# Patient Record
Sex: Female | Born: 1970 | Race: Black or African American | Hispanic: No | Smoking: Former smoker
Health system: Southern US, Community
[De-identification: ages and names within clinical notes are randomized; demographics above are authoritative.]

## PROBLEM LIST (undated history)

## (undated) DIAGNOSIS — I10 Essential (primary) hypertension: Secondary | ICD-10-CM

## (undated) DIAGNOSIS — J189 Pneumonia, unspecified organism: Secondary | ICD-10-CM

## (undated) DIAGNOSIS — R0600 Dyspnea, unspecified: Secondary | ICD-10-CM

## (undated) DIAGNOSIS — Z973 Presence of spectacles and contact lenses: Secondary | ICD-10-CM

## (undated) DIAGNOSIS — D649 Anemia, unspecified: Secondary | ICD-10-CM

## (undated) DIAGNOSIS — E119 Type 2 diabetes mellitus without complications: Secondary | ICD-10-CM

## (undated) DIAGNOSIS — G4730 Sleep apnea, unspecified: Secondary | ICD-10-CM

## (undated) DIAGNOSIS — Z8759 Personal history of other complications of pregnancy, childbirth and the puerperium: Secondary | ICD-10-CM

## (undated) DIAGNOSIS — R319 Hematuria, unspecified: Secondary | ICD-10-CM

## (undated) DIAGNOSIS — Z6841 Body Mass Index (BMI) 40.0 and over, adult: Secondary | ICD-10-CM

## (undated) DIAGNOSIS — N939 Abnormal uterine and vaginal bleeding, unspecified: Secondary | ICD-10-CM

## (undated) DIAGNOSIS — J45909 Unspecified asthma, uncomplicated: Secondary | ICD-10-CM

## (undated) DIAGNOSIS — F419 Anxiety disorder, unspecified: Secondary | ICD-10-CM

## (undated) DIAGNOSIS — Z9289 Personal history of other medical treatment: Secondary | ICD-10-CM

## (undated) HISTORY — PX: TUBAL LIGATION: SHX77

## (undated) HISTORY — DX: Pneumonia, unspecified organism: J18.9

## (undated) HISTORY — DX: Body Mass Index (BMI) 40.0 and over, adult: Z684

## (undated) HISTORY — DX: Anemia, unspecified: D64.9

## (undated) HISTORY — DX: Other disorders of bilirubin metabolism: E80.6

## (undated) HISTORY — DX: Hematuria, unspecified: R31.9

## (undated) HISTORY — DX: Unspecified asthma, uncomplicated: J45.909

## (undated) HISTORY — DX: Morbid (severe) obesity due to excess calories: E66.01

## (undated) HISTORY — DX: Essential (primary) hypertension: I10

---

## 2011-11-20 DIAGNOSIS — R0600 Dyspnea, unspecified: Secondary | ICD-10-CM | POA: Insufficient documentation

## 2016-04-15 DIAGNOSIS — Z124 Encounter for screening for malignant neoplasm of cervix: Secondary | ICD-10-CM | POA: Diagnosis not present

## 2016-04-15 DIAGNOSIS — Z1231 Encounter for screening mammogram for malignant neoplasm of breast: Secondary | ICD-10-CM | POA: Diagnosis not present

## 2016-04-15 DIAGNOSIS — N939 Abnormal uterine and vaginal bleeding, unspecified: Secondary | ICD-10-CM | POA: Diagnosis not present

## 2016-04-15 DIAGNOSIS — N921 Excessive and frequent menstruation with irregular cycle: Secondary | ICD-10-CM | POA: Diagnosis not present

## 2016-04-22 DIAGNOSIS — D649 Anemia, unspecified: Secondary | ICD-10-CM | POA: Diagnosis not present

## 2016-04-22 DIAGNOSIS — Z87891 Personal history of nicotine dependence: Secondary | ICD-10-CM | POA: Diagnosis not present

## 2016-04-22 DIAGNOSIS — D500 Iron deficiency anemia secondary to blood loss (chronic): Secondary | ICD-10-CM | POA: Insufficient documentation

## 2016-04-22 DIAGNOSIS — Z809 Family history of malignant neoplasm, unspecified: Secondary | ICD-10-CM | POA: Diagnosis not present

## 2016-04-22 DIAGNOSIS — I10 Essential (primary) hypertension: Secondary | ICD-10-CM | POA: Diagnosis not present

## 2016-04-22 DIAGNOSIS — D473 Essential (hemorrhagic) thrombocythemia: Secondary | ICD-10-CM | POA: Diagnosis not present

## 2016-04-22 DIAGNOSIS — Z832 Family history of diseases of the blood and blood-forming organs and certain disorders involving the immune mechanism: Secondary | ICD-10-CM | POA: Diagnosis not present

## 2016-04-22 DIAGNOSIS — N921 Excessive and frequent menstruation with irregular cycle: Secondary | ICD-10-CM | POA: Diagnosis not present

## 2016-07-01 DIAGNOSIS — R9431 Abnormal electrocardiogram [ECG] [EKG]: Secondary | ICD-10-CM | POA: Diagnosis not present

## 2016-07-01 DIAGNOSIS — I10 Essential (primary) hypertension: Secondary | ICD-10-CM | POA: Diagnosis not present

## 2016-07-10 DIAGNOSIS — I10 Essential (primary) hypertension: Secondary | ICD-10-CM | POA: Diagnosis not present

## 2016-10-13 DIAGNOSIS — R319 Hematuria, unspecified: Secondary | ICD-10-CM | POA: Insufficient documentation

## 2016-10-13 DIAGNOSIS — R05 Cough: Secondary | ICD-10-CM | POA: Diagnosis not present

## 2016-10-13 DIAGNOSIS — R509 Fever, unspecified: Secondary | ICD-10-CM | POA: Diagnosis not present

## 2016-10-16 DIAGNOSIS — J4522 Mild intermittent asthma with status asthmaticus: Secondary | ICD-10-CM | POA: Diagnosis not present

## 2017-04-21 ENCOUNTER — Encounter: Payer: Self-pay | Admitting: Nurse Practitioner

## 2017-04-21 ENCOUNTER — Ambulatory Visit: Payer: Medicaid Other | Attending: Nurse Practitioner | Admitting: Nurse Practitioner

## 2017-04-21 VITALS — BP 133/94 | HR 88 | Temp 97.8°F | Ht 73.0 in | Wt 298.6 lb

## 2017-04-21 DIAGNOSIS — I10 Essential (primary) hypertension: Secondary | ICD-10-CM | POA: Diagnosis not present

## 2017-04-21 DIAGNOSIS — Z6841 Body Mass Index (BMI) 40.0 and over, adult: Secondary | ICD-10-CM | POA: Diagnosis not present

## 2017-04-21 DIAGNOSIS — F419 Anxiety disorder, unspecified: Secondary | ICD-10-CM

## 2017-04-21 DIAGNOSIS — J4520 Mild intermittent asthma, uncomplicated: Secondary | ICD-10-CM

## 2017-04-21 DIAGNOSIS — G4709 Other insomnia: Secondary | ICD-10-CM

## 2017-04-21 DIAGNOSIS — Z79899 Other long term (current) drug therapy: Secondary | ICD-10-CM | POA: Insufficient documentation

## 2017-04-21 DIAGNOSIS — G4700 Insomnia, unspecified: Secondary | ICD-10-CM | POA: Diagnosis not present

## 2017-04-21 DIAGNOSIS — N938 Other specified abnormal uterine and vaginal bleeding: Secondary | ICD-10-CM | POA: Diagnosis not present

## 2017-04-21 MED ORDER — MEDROXYPROGESTERONE ACETATE 10 MG PO TABS: 10.0000 mg | ORAL_TABLET | Freq: Every day | ORAL | 0 refills | Status: DC

## 2017-04-21 MED ORDER — LISINOPRIL-HYDROCHLOROTHIAZIDE 20-25 MG PO TABS: 2.0000 | ORAL_TABLET | Freq: Every day | ORAL | 1 refills | Status: DC

## 2017-04-21 MED ORDER — LISINOPRIL-HYDROCHLOROTHIAZIDE 20-25 MG PO TABS: 1.0000 | ORAL_TABLET | Freq: Every day | ORAL | 1 refills | Status: DC

## 2017-04-21 MED ORDER — TRAZODONE HCL 100 MG PO TABS: 100.0000 mg | ORAL_TABLET | Freq: Every day | ORAL | 1 refills | Status: DC

## 2017-04-21 MED ORDER — FLUTICASONE FUROATE-VILANTEROL 100-25 MCG/INH IN AEPB: 1.0000 | INHALATION_SPRAY | Freq: Every day | RESPIRATORY_TRACT | 3 refills | Status: DC

## 2017-04-21 MED ORDER — BUSPIRONE HCL 15 MG PO TABS: 15.0000 mg | ORAL_TABLET | Freq: Three times a day (TID) | ORAL | 2 refills | Status: DC

## 2017-04-21 NOTE — Progress Notes (Signed)
Assessment & Plan:  Tonya Wilkerson was seen today for new patient (initial visit).  Diagnoses and all orders for this visit:  Essential hypertension -     CBC -     Basic metabolic panel -     Lipid panel -     lisinopril-hydrochlorothiazide (PRINZIDE,ZESTORETIC) 20-25 MG tablet; Take 2 tablets by mouth daily. Continue all antihypertensives as prescribed.  Remember to bring in your blood pressure log with you for your follow up appointment.  DASH/Mediterranean Diets are healthier choices for HTN.    Other insomnia -     Ambulatory referral to Sleep Studies -     traZODone (DESYREL) 100 MG tablet; Take 1 tablet (100 mg total) by mouth at bedtime.  -     Ambulatory referral to Psychology.  Dysfunctional uterine bleeding -     medroxyPROGESTERone (PROVERA) 10 MG tablet; Take 1 tablet (10 mg total) by mouth daily.  Mild intermittent asthma without complication -     fluticasone furoate-vilanterol (BREO ELLIPTA) 100-25 MCG/INH AEPB; Inhale 1 puff into the lungs daily. Avoid asthma/allergy triggers.    Anxiety -     busPIRone (BUSPAR) 15 MG tablet; Take 1 tablet (15 mg total) by mouth 3 (three) times daily.  Take medications as prescribed.     Patient has been counseled on age-appropriate routine health concerns for screening and prevention. These are reviewed and up-to-date. Referrals have been placed accordingly. Immunizations are up-to-date or declined.    Subjective:   Chief Complaint  Patient presents with  . New Patient (Initial Visit)    Patient is here to establish care.    [name] 47 y.o. female presents to office today to establish care. She has a history of essential hypertension as well as asthma and anxiety.   Anxiety Takes Buspar with significant relief of anxiety symptoms. She lost her mother about 3 months ago unexpectedly as well as her uncle a few months ago. Having a difficult time financially, personally and emotionally. She would like a referral to  a grief counselor. She declines starting SSRI today. She denies any suicidal ideation.  Depression screen PHQ 2/9 04/21/2017  Decreased Interest 1  Down, Depressed, Hopeless 1  PHQ - 2 Score 2  Altered sleeping 3  Tired, decreased energy 2  Change in appetite 3  Feeling bad or failure about yourself  0  Trouble concentrating 0  Moving slowly or fidgety/restless 0  Suicidal thoughts 0  PHQ-9 Score 10    Insomnia This has been ongoing for several months. She has tried melatonin with no relief of symptoms. Having difficulty falling and staying asleep. Will try trazodone. Will also order sleep apnea study due to obesity, difficulty staying asleep and poorly controlled hypertension.  Dysfunctional uterine bleeding She has been experiencing AUB for 4 months. She endorses a normal PAP as of  03-2016. Denies menorrhagia but states she is bleeding every day. She does have a history of IDA. Will try provera and refer back to GYN if unsuccessful. She denies any other abnormal GU symptoms. Denies abdominal pain.   Essential Hypertension Blood pressure borderline controlled today. She reports readings at home upper 140/90s. Will increase prinzide to 2 tablets. Denies chest pain, palpitations, lightheadedness, dizziness, or BLE edema. She does endorse shortness of breath and a mild headache today.   Asthma Well controlled with BREO. She has had asthma exacerbations in the past requiring hospitalizations as well as CAP. She has been out of her inhaler   Review  of Systems  Constitutional: Negative for fever, malaise/fatigue and weight loss.  HENT: Negative.  Negative for nosebleeds.   Eyes: Negative.  Negative for blurred vision, double vision and photophobia.  Respiratory: Positive for cough and shortness of breath.   Cardiovascular: Negative.  Negative for chest pain, palpitations and leg swelling.  Gastrointestinal: Negative.  Negative for abdominal pain, constipation, diarrhea, heartburn, nausea  and vomiting.  Genitourinary: Negative for dysuria, frequency and urgency.  Musculoskeletal: Negative.  Negative for myalgias.  Neurological: Positive for headaches. Negative for dizziness, focal weakness and seizures.  Endo/Heme/Allergies: Negative for environmental allergies.  Psychiatric/Behavioral: Positive for depression. Negative for suicidal ideas. The patient is nervous/anxious.     Past Medical History:  Diagnosis Date  . Anemia   . Asthma   . Hematuria   . Hyperbilirubinemia   . Hypertension   . Morbid obesity with BMI of 40.0-44.9, adult ([place])   . Pneumonia due to infectious organism     Past Surgical History:  Procedure Laterality Date  . TUBAL LIGATION      Family History  Problem Relation Age of Onset  . Hypertension Mother   . Stroke Mother   . Diabetes Maternal Aunt   . Cancer Maternal Grandfather     Social History Reviewed with no changes to be made today.   Outpatient Medications Prior to Visit  Medication Sig Dispense Refill  . amLODipine (NORVASC) 10 MG tablet Take 10 mg by mouth daily.    . busPIRone (BUSPAR) 15 MG tablet Take 15 mg by mouth 3 (three) times daily.    Marland Kitchen lisinopril-hydrochlorothiazide (PRINZIDE,ZESTORETIC) 20-25 MG tablet Take 1 tablet by mouth daily.     No facility-administered medications prior to visit.     Not on File     Objective:    BP (!) 133/94 (BP Location: Left Arm, Cuff Size: Large)   Pulse 88   Temp 97.8 F (36.6 C) (Oral)   Ht 6\' 1"  (1.854 m)   Wt 298 lb 9.6 oz (135.4 kg)   SpO2 98%   BMI 39.40 kg/m  Wt Readings from Last 3 Encounters:  04/21/17 298 lb 9.6 oz (135.4 kg)    Physical Exam  Constitutional: She is oriented to person, place, and time. She appears well-developed and well-nourished. She is cooperative.  HENT:  Head: Normocephalic and atraumatic.  Eyes: EOM are normal.  Neck: Normal range of motion.  Cardiovascular: Normal rate, regular rhythm and normal heart sounds. Exam reveals no gallop  and no friction rub.  No murmur heard. Pulmonary/Chest: Effort normal and breath sounds normal. No tachypnea. No respiratory distress. She has no decreased breath sounds. She has no wheezes. She has no rhonchi. She has no rales. She exhibits no tenderness.  Abdominal: Soft. Bowel sounds are normal.  Musculoskeletal: Normal range of motion. She exhibits no edema.  Neurological: She is alert and oriented to person, place, and time. Coordination normal.  Skin: Skin is warm and dry.  Psychiatric: She has a normal mood and affect. Her behavior is normal. Judgment and thought content normal.  Nursing note and vitals reviewed.      Patient has been counseled extensively about nutrition and exercise as well as the importance of adherence with medications and regular follow-up. The patient was given clear instructions to go to ER or return to medical center if symptoms don't improve, worsen or new problems develop. The patient verbalized understanding.   Follow-up: Return in about 2 weeks (around 05/05/2017) for BP recheck needs 30 minutes.  Gildardo Pounds, FNP-BC [facility] and [place], [place]   04/21/2017, 1:24 PM

## 2017-04-21 NOTE — Patient Instructions (Addendum)

## 2017-04-22 LAB — BASIC METABOLIC PANEL
BUN/Creatinine Ratio: 7 — ABNORMAL LOW (ref 9–23)
BUN: 6 mg/dL (ref 6–24)
CALCIUM: 10.1 mg/dL (ref 8.7–10.2)
CHLORIDE: 98 mmol/L (ref 96–106)
CO2: 23 mmol/L (ref 20–29)
Creatinine, Ser: 0.83 mg/dL (ref 0.57–1.00)
GFR calc Af Amer: 97 mL/min/{1.73_m2} (ref >59)
GFR, EST NON AFRICAN AMERICAN: 84 mL/min/{1.73_m2} (ref >59)
GLUCOSE: 103 mg/dL — AB (ref 65–99)
Potassium: 4.6 mmol/L (ref 3.5–5.2)
SODIUM: 139 mmol/L (ref 134–144)

## 2017-04-22 LAB — CBC
HEMATOCRIT: 46.5 % (ref 34.0–46.6)
Hemoglobin: 14.9 g/dL (ref 11.1–15.9)
MCH: 29 pg (ref 26.6–33.0)
MCHC: 32 g/dL (ref 31.5–35.7)
MCV: 91 fL (ref 79–97)
Platelets: 442 10*3/uL — ABNORMAL HIGH (ref 150–379)
RBC: 5.13 x10E6/uL (ref 3.77–5.28)
RDW: 14.9 % (ref 12.3–15.4)
WBC: 8 10*3/uL (ref 3.4–10.8)

## 2017-04-22 LAB — LIPID PANEL
CHOL/HDL RATIO: 2.1 ratio (ref 0.0–4.4)
Cholesterol, Total: 223 mg/dL — ABNORMAL HIGH (ref 100–199)
HDL: 106 mg/dL (ref >39)
LDL Calculated: 97 mg/dL (ref 0–99)
TRIGLYCERIDES: 101 mg/dL (ref 0–149)
VLDL Cholesterol Cal: 20 mg/dL (ref 5–40)

## 2017-05-05 DIAGNOSIS — I10 Essential (primary) hypertension: Secondary | ICD-10-CM | POA: Diagnosis not present

## 2017-05-05 DIAGNOSIS — R29818 Other symptoms and signs involving the nervous system: Secondary | ICD-10-CM | POA: Diagnosis not present

## 2017-05-05 DIAGNOSIS — R0683 Snoring: Secondary | ICD-10-CM | POA: Diagnosis not present

## 2017-05-05 DIAGNOSIS — Z6841 Body Mass Index (BMI) 40.0 and over, adult: Secondary | ICD-10-CM | POA: Diagnosis not present

## 2017-05-05 DIAGNOSIS — G4710 Hypersomnia, unspecified: Secondary | ICD-10-CM | POA: Diagnosis not present

## 2017-05-19 ENCOUNTER — Encounter: Payer: Self-pay | Admitting: Nurse Practitioner

## 2017-05-19 ENCOUNTER — Ambulatory Visit: Payer: Medicaid Other | Attending: Nurse Practitioner | Admitting: Nurse Practitioner

## 2017-05-19 VITALS — BP 142/96 | HR 92 | Temp 97.9°F | Ht 73.0 in | Wt 299.8 lb

## 2017-05-19 DIAGNOSIS — G4733 Obstructive sleep apnea (adult) (pediatric): Secondary | ICD-10-CM | POA: Diagnosis not present

## 2017-05-19 DIAGNOSIS — Z8249 Family history of ischemic heart disease and other diseases of the circulatory system: Secondary | ICD-10-CM | POA: Diagnosis not present

## 2017-05-19 DIAGNOSIS — I10 Essential (primary) hypertension: Secondary | ICD-10-CM

## 2017-05-19 DIAGNOSIS — Z833 Family history of diabetes mellitus: Secondary | ICD-10-CM | POA: Diagnosis not present

## 2017-05-19 DIAGNOSIS — Z6841 Body Mass Index (BMI) 40.0 and over, adult: Secondary | ICD-10-CM | POA: Insufficient documentation

## 2017-05-19 DIAGNOSIS — Z79899 Other long term (current) drug therapy: Secondary | ICD-10-CM | POA: Insufficient documentation

## 2017-05-19 DIAGNOSIS — F419 Anxiety disorder, unspecified: Secondary | ICD-10-CM | POA: Insufficient documentation

## 2017-05-19 DIAGNOSIS — Z7189 Other specified counseling: Secondary | ICD-10-CM | POA: Diagnosis not present

## 2017-05-19 DIAGNOSIS — Z23 Encounter for immunization: Secondary | ICD-10-CM | POA: Diagnosis not present

## 2017-05-19 DIAGNOSIS — Z823 Family history of stroke: Secondary | ICD-10-CM | POA: Diagnosis not present

## 2017-05-19 DIAGNOSIS — Z9851 Tubal ligation status: Secondary | ICD-10-CM | POA: Insufficient documentation

## 2017-05-19 NOTE — Progress Notes (Signed)
Assessment & Plan:  Tonya Wilkerson was seen today for follow-up.  Diagnoses and all orders for this visit:  Essential hypertension She no refills required today however Continue all antihypertensives as prescribed.  Remember to bring in your blood pressure log with you for your follow up appointment.  DASH/Mediterranean Diets are healthier choices for HTN.    Immunization due -     Flu Vaccine QUAD 6+ mos PF IM (Fluarix Quad PF)  Grief counseling -     Ambulatory referral to Psychology    Patient has been counseled on age-appropriate routine health concerns for screening and prevention. These are reviewed and up-to-date. Referrals have been placed accordingly. Immunizations are up-to-date or declined.    Subjective:   Chief Complaint  Patient presents with  . Follow-up    Patient is here for a follow-up on blood pressure.    [name] 47 y.o. female presents to office today for blood pressure follow up.  Essential Hypertension Chronic.  Patient endorses medication compliance.  Currently taking Prinzide 2 tablets daily as well as amlodipine 10 mg.  She recently had a sleep study performed and was diagnosed with obstructive sleep apnea.  I will not make any antihypertensive medication changes until she has received her CPAP and started using.  I believe some of her hypertension may be related to just poorly controlled OSA. Denies chest pain, shortness of breath, palpitations, lightheadedness, dizziness, headaches or BLE edema. She has a home blood pressure monitor and was instructed today to begin checking her blood pressure 3 times a week. BP Readings from Last 3 Encounters:  05/19/17 (!) 142/96  04/21/17 (!) 133/94   Grief Counseling  Her mother passed away a few months ago.  She endorses sadness and is experiencing grief at this time.  I referred her to an LCSW at her last office visit however she states she was not happy with the telephone interaction she Had with office  personnel when she was contacted to schedule an appointment.  I will place a new referral today for a psychologist.  She is currently taking Buspar which is providing significant relief of her anxiety.  Depression screen PHQ 2/9 05/19/2017  Decreased Interest 1  Down, Depressed, Hopeless 1  PHQ - 2 Score 2  Altered sleeping 1  Tired, decreased energy 2  Change in appetite 3  Feeling bad or failure about yourself  0  Trouble concentrating 1  Moving slowly or fidgety/restless 0  Suicidal thoughts 0  PHQ-9 Score 9    Review of Systems  Constitutional: Negative for fever, malaise/fatigue and weight loss.  HENT: Negative.  Negative for nosebleeds.   Eyes: Negative.  Negative for blurred vision, double vision and photophobia.  Respiratory: Negative.  Negative for cough and shortness of breath.   Cardiovascular: Negative.  Negative for chest pain, palpitations and leg swelling.  Gastrointestinal: Negative.  Negative for heartburn, nausea and vomiting.  Musculoskeletal: Negative.  Negative for myalgias.  Neurological: Negative.  Negative for dizziness, focal weakness, seizures and headaches.  Psychiatric/Behavioral: Positive for depression. Negative for suicidal ideas. The patient is nervous/anxious and has insomnia.     Past Medical History:  Diagnosis Date  . Anemia   . Asthma   . Hematuria   . Hyperbilirubinemia   . Hypertension   . Morbid obesity with BMI of 40.0-44.9, adult ([place])   . Pneumonia due to infectious organism     Past Surgical History:  Procedure Laterality Date  . TUBAL LIGATION  Family History  Problem Relation Age of Onset  . Hypertension Mother   . Stroke Mother   . Diabetes Maternal Aunt   . Cancer Maternal Grandfather     Social History Reviewed with no changes to be made today.   Outpatient Medications Prior to Visit  Medication Sig Dispense Refill  . amLODipine (NORVASC) 10 MG tablet Take 10 mg by mouth daily.    . busPIRone (BUSPAR) 15 MG  tablet Take 1 tablet (15 mg total) by mouth 3 (three) times daily. 90 tablet 2  . fluticasone furoate-vilanterol (BREO ELLIPTA) 100-25 MCG/INH AEPB Inhale 1 puff into the lungs daily. 60 each 3  . lisinopril-hydrochlorothiazide (PRINZIDE,ZESTORETIC) 20-25 MG tablet Take 2 tablets by mouth daily. 90 tablet 1  . medroxyPROGESTERone (PROVERA) 10 MG tablet Take 1 tablet (10 mg total) by mouth daily. 10 tablet 0  . traZODone (DESYREL) 100 MG tablet Take 1 tablet (100 mg total) by mouth at bedtime. (Patient not taking: Reported on 05/19/2017) 90 tablet 1   No facility-administered medications prior to visit.     Not on File     Objective:    BP (!) 142/96 (BP Location: Left Arm, Patient Position: Sitting, Cuff Size: Large)   Pulse 92   Temp 97.9 F (36.6 C) (Oral)   Ht 6\' 1"  (1.854 m)   Wt 299 lb 12.8 oz (136 kg)   LMP 05/11/2017   SpO2 95%   BMI 39.55 kg/m   Wt Readings from Last 3 Encounters:  05/19/17 299 lb 12.8 oz (136 kg)  04/21/17 298 lb 9.6 oz (135.4 kg)    Physical Exam  Constitutional: She is oriented to person, place, and time. She appears well-developed and well-nourished. She is cooperative.  HENT:  Head: Normocephalic and atraumatic.  Eyes: EOM are normal.  Neck: Normal range of motion and full passive range of motion without pain. Neck supple. No thyromegaly present.  Cardiovascular: Normal rate, regular rhythm and normal heart sounds. Exam reveals no gallop and no friction rub.  No murmur heard. Pulmonary/Chest: Effort normal and breath sounds normal. No tachypnea. No respiratory distress. She has no decreased breath sounds. She has no wheezes. She has no rhonchi. She has no rales. She exhibits no tenderness.  Abdominal: Soft. Bowel sounds are normal.  Musculoskeletal: Normal range of motion. She exhibits no edema.  Lymphadenopathy:    She has no cervical adenopathy.  Neurological: She is alert and oriented to person, place, and time. Coordination normal.  Skin:  Skin is warm and dry.  Psychiatric: She has a normal mood and affect. Her speech is normal and behavior is normal. Judgment and thought content normal. Cognition and memory are normal. She expresses no homicidal and no suicidal ideation. She expresses no suicidal plans and no homicidal plans.  Nursing note and vitals reviewed.      Patient has been counseled extensively about nutrition and exercise as well as the importance of adherence with medications and regular follow-up. The patient was given clear instructions to go to ER or return to medical center if symptoms don't improve, worsen or new problems develop. The patient verbalized understanding.   Follow-up: Return in about 1 month (around 06/19/2017) for BP recheck.   Gildardo Pounds, FNP-BC [facility] and [facility], [place]   05/19/2017, 1:21 PM

## 2017-05-19 NOTE — Patient Instructions (Addendum)
Sleep Apnea Sleep apnea is a condition in which breathing pauses or becomes shallow during sleep. Episodes of sleep apnea usually last 10 seconds or longer, and they may occur as many as 20 times an hour. Sleep apnea disrupts your sleep and keeps your body from getting the rest that it needs. This condition can increase your risk of certain health problems, including:  Heart attack.  Stroke.  Obesity.  Diabetes.  Heart failure.  Irregular heartbeat.  There are three kinds of sleep apnea:  Obstructive sleep apnea. This kind is caused by a blocked or collapsed airway.  Central sleep apnea. This kind happens when the part of the brain that controls breathing does not send the correct signals to the muscles that control breathing.  Mixed sleep apnea. This is a combination of obstructive and central sleep apnea.  What are the causes? The most common cause of this condition is a collapsed or blocked airway. An airway can collapse or become blocked if:  Your throat muscles are abnormally relaxed.  Your tongue and tonsils are larger than normal.  You are overweight.  Your airway is smaller than normal.  What increases the risk? This condition is more likely to develop in people who:  Are overweight.  Smoke.  Have a smaller than normal airway.  Are elderly.  Are female.  Drink alcohol.  Take sedatives or tranquilizers.  Have a family history of sleep apnea.  What are the signs or symptoms? Symptoms of this condition include:  Trouble staying asleep.  Daytime sleepiness and tiredness.  Irritability.  Loud snoring.  Morning headaches.  Trouble concentrating.  Forgetfulness.  Decreased interest in sex.  Unexplained sleepiness.  Mood swings.  Personality changes.  Feelings of depression.  Waking up often during the night to urinate.  Dry mouth.  Sore throat.  How is this diagnosed? This condition may be diagnosed with:  A medical history.  A  physical exam.  A series of tests that are done while you are sleeping (sleep study). These tests are usually done in a sleep lab, but they may also be done at home.  How is this treated? Treatment for this condition aims to restore normal breathing and to ease symptoms during sleep. It may involve managing health issues that can affect breathing, such as high blood pressure or obesity. Treatment may include:  Sleeping on your side.  Using a decongestant if you have nasal congestion.  Avoiding the use of depressants, including alcohol, sedatives, and narcotics.  Losing weight if you are overweight.  Making changes to your diet.  Quitting smoking.  Using a device to open your airway while you sleep, such as: ? An oral appliance. This is a custom-made mouthpiece that shifts your lower jaw forward. ? A continuous positive airway pressure (CPAP) device. This device delivers oxygen to your airway through a mask. ? A nasal expiratory positive airway pressure (EPAP) device. This device has valves that you put into each nostril. ? A bi-level positive airway pressure (BPAP) device. This device delivers oxygen to your airway through a mask.  Surgery if other treatments do not work. During surgery, excess tissue is removed to create a wider airway.  It is important to get treatment for sleep apnea. Without treatment, this condition can lead to:  High blood pressure.  Coronary artery disease.  (Men) An inability to achieve or maintain an erection (impotence).  Reduced thinking abilities.  Follow these instructions at home:  Make any lifestyle changes that your health   care provider recommends.  Eat a healthy, well-balanced diet.  Take over-the-counter and prescription medicines only as told by your health care provider.  Avoid using depressants, including alcohol, sedatives, and narcotics.  Take steps to lose weight if you are overweight.  If you were given a device to open your  airway while you sleep, use it only as told by your health care provider.  Do not use any tobacco products, such as cigarettes, chewing tobacco, and e-cigarettes. If you need help quitting, ask your health care provider.  Keep all follow-up visits as told by your health care provider. This is important. Contact a health care provider if:  The device that you received to open your airway during sleep is uncomfortable or does not seem to be working.  Your symptoms do not improve.  Your symptoms get worse. Get help right away if:  You develop chest pain.  You develop shortness of breath.  You develop discomfort in your back, arms, or stomach.  You have trouble speaking.  You have weakness on one side of your body.  You have drooping in your face. These symptoms may represent a serious problem that is an emergency. Do not wait to see if the symptoms will go away. Get medical help right away. Call your local emergency services (911 in the [place] Do not drive yourself to the hospital. This information is not intended to replace advice given to you by your health care provider. Make sure you discuss any questions you have with your health care provider. Document Released: 02/21/2002 Document Revised: 10/28/2015 Document Reviewed: 12/11/2014 Elsevier Interactive Patient Education  2018 [employer].  CPAP and BiPAP Information CPAP and BiPAP are methods of helping a person breathe with the use of air pressure. CPAP stands for "continuous positive airway pressure." BiPAP stands for "bi-level positive airway pressure." In both methods, air is blown through your nose or mouth and into your air passages to help you breathe well. CPAP and BiPAP use different amounts of pressure to blow air. With CPAP, the amount of pressure stays the same while you breathe in and out. With BiPAP, the amount of pressure is increased when you breathe in (inhale) so that you can take larger breaths. Your health care  provider will recommend whether CPAP or BiPAP would be more helpful for you. Why are CPAP and BiPAP treatments used? CPAP or BiPAP can be helpful if you have:  Sleep apnea.  Chronic obstructive pulmonary disease (COPD).  Heart failure.  Medical conditions that weaken the muscles of the chest including muscular dystrophy, or neurological diseases such as amyotrophic lateral sclerosis (ALS).  Other problems that cause breathing to be weak, abnormal, or difficult.  CPAP is most commonly used for obstructive sleep apnea (OSA) to keep the airways from collapsing when the muscles relax during sleep. How is CPAP or BiPAP administered? Both CPAP and BiPAP are provided by a small machine with a flexible plastic tube that attaches to a plastic mask. You wear the mask. Air is blown through the mask into your nose or mouth. The amount of pressure that is used to blow the air can be adjusted on the machine. Your health care provider will determine the pressure setting that should be used based on your individual needs. When should CPAP or BiPAP be used? In most cases, the mask only needs to be worn during sleep. Generally, the mask needs to be worn throughout the night and during any daytime naps. People with certain medical  conditions may also need to wear the mask at other times when they are awake. Follow instructions from your health care provider about when to use the machine. What are some tips for using the mask?  Because the mask needs to be snug, some people feel trapped or closed-in (claustrophobic) when first using the mask. If you feel this way, you may need to get used to the mask. One way to do this is by holding the mask loosely over your nose or mouth and then gradually applying the mask more snugly. You can also gradually increase the amount of time that you use the mask.  Masks are available in various types and sizes. Some fit over your mouth and nose while others fit over just your  nose. If your mask does not fit well, talk with your health care provider about getting a different one.  If you are using a mask that fits over your nose and you tend to breathe through your mouth, a chin strap may be applied to help keep your mouth closed.  The CPAP and BiPAP machines have alarms that may sound if the mask comes off or develops a leak.  If you have trouble with the mask, it is very important that you talk with your health care provider about finding a way to make the mask easier to tolerate. Do not stop using the mask. Stopping the use of the mask could have a negative impact on your health. What are some tips for using the machine?  Place your CPAP or BiPAP machine on a secure table or stand near an electrical outlet.  Know where the on/off switch is located on the machine.  Follow instructions from your health care provider about how to set the pressure on your machine and when you should use it.  Do not eat or drink while the CPAP or BiPAP machine is on. Food or fluids could get pushed into your lungs by the pressure of the CPAP or BiPAP.  Do not smoke. Tobacco smoke residue can damage the machine.  For home use, CPAP and BiPAP machines can be rented or purchased through home health care companies. Many different brands of machines are available. Renting a machine before purchasing may help you find out which particular machine works well for you.  Keep the CPAP or BiPAP machine and attachments clean. Ask your health care provider for specific instructions. Get help right away if:  You have redness or open areas around your nose or mouth where the mask fits.  You have trouble using the CPAP or BiPAP machine.  You cannot tolerate wearing the CPAP or BiPAP mask.  You have pain, discomfort, and bloating in your abdomen. Summary  CPAP and BiPAP are methods of helping a person breathe with the use of air pressure.  Both CPAP and BiPAP are provided by a small  machine with a flexible plastic tube that attaches to a plastic mask.  If you have trouble with the mask, it is very important that you talk with your health care provider about finding a way to make the mask easier to tolerate. This information is not intended to replace advice given to you by your health care provider. Make sure you discuss any questions you have with your health care provider. Document Released: 11/30/2003 Document Revised: 01/21/2016 Document Reviewed: 01/21/2016 Elsevier Interactive Patient Education  2017 [employer].

## 2017-06-15 DIAGNOSIS — G4733 Obstructive sleep apnea (adult) (pediatric): Secondary | ICD-10-CM | POA: Diagnosis not present

## 2017-07-10 DIAGNOSIS — D473 Essential (hemorrhagic) thrombocythemia: Secondary | ICD-10-CM | POA: Insufficient documentation

## 2017-07-10 DIAGNOSIS — D75839 Thrombocytosis, unspecified: Secondary | ICD-10-CM | POA: Insufficient documentation

## 2017-07-15 DIAGNOSIS — G4733 Obstructive sleep apnea (adult) (pediatric): Secondary | ICD-10-CM | POA: Diagnosis not present

## 2017-07-28 ENCOUNTER — Other Ambulatory Visit: Payer: Self-pay | Admitting: Nurse Practitioner

## 2017-07-28 DIAGNOSIS — F419 Anxiety disorder, unspecified: Secondary | ICD-10-CM

## 2017-07-31 NOTE — Telephone Encounter (Signed)
Patient last seen 05/19/17. Patient received medication 04/21/17 with 2 additional refills

## 2017-08-03 ENCOUNTER — Other Ambulatory Visit: Payer: Self-pay | Admitting: Nurse Practitioner

## 2017-08-15 DIAGNOSIS — G4733 Obstructive sleep apnea (adult) (pediatric): Secondary | ICD-10-CM | POA: Diagnosis not present

## 2017-08-31 ENCOUNTER — Encounter: Payer: Self-pay | Admitting: Nurse Practitioner

## 2017-08-31 ENCOUNTER — Ambulatory Visit: Payer: Medicaid Other | Attending: Nurse Practitioner | Admitting: Nurse Practitioner

## 2017-08-31 VITALS — BP 126/87 | HR 93 | Temp 98.9°F | Ht 73.0 in | Wt 300.4 lb

## 2017-08-31 DIAGNOSIS — Z823 Family history of stroke: Secondary | ICD-10-CM | POA: Insufficient documentation

## 2017-08-31 DIAGNOSIS — Z7951 Long term (current) use of inhaled steroids: Secondary | ICD-10-CM | POA: Insufficient documentation

## 2017-08-31 DIAGNOSIS — J45909 Unspecified asthma, uncomplicated: Secondary | ICD-10-CM | POA: Insufficient documentation

## 2017-08-31 DIAGNOSIS — Z8249 Family history of ischemic heart disease and other diseases of the circulatory system: Secondary | ICD-10-CM | POA: Diagnosis not present

## 2017-08-31 DIAGNOSIS — I10 Essential (primary) hypertension: Secondary | ICD-10-CM | POA: Insufficient documentation

## 2017-08-31 DIAGNOSIS — E669 Obesity, unspecified: Secondary | ICD-10-CM | POA: Diagnosis not present

## 2017-08-31 DIAGNOSIS — J301 Allergic rhinitis due to pollen: Secondary | ICD-10-CM | POA: Diagnosis not present

## 2017-08-31 DIAGNOSIS — Z0000 Encounter for general adult medical examination without abnormal findings: Secondary | ICD-10-CM

## 2017-08-31 DIAGNOSIS — Z79899 Other long term (current) drug therapy: Secondary | ICD-10-CM | POA: Diagnosis not present

## 2017-08-31 DIAGNOSIS — Z6839 Body mass index (BMI) 39.0-39.9, adult: Secondary | ICD-10-CM | POA: Diagnosis not present

## 2017-08-31 MED ORDER — FLUTICASONE PROPIONATE 50 MCG/ACT NA SUSP: 1.0000 | Freq: Every day | NASAL | 2 refills | Status: DC

## 2017-08-31 MED ORDER — LISINOPRIL-HYDROCHLOROTHIAZIDE 20-25 MG PO TABS: 2.0000 | ORAL_TABLET | Freq: Every day | ORAL | 1 refills | Status: DC

## 2017-08-31 MED ORDER — PHENTERMINE HCL 37.5 MG PO CAPS: 37.5000 mg | ORAL_CAPSULE | ORAL | 0 refills | Status: DC

## 2017-08-31 NOTE — Patient Instructions (Signed)
Obesity, Adult Obesity is having too much body fat. If you have a BMI of 30 or more, you are obese. BMI is a number that explains how much body fat you have. Obesity is often caused by taking in (consuming) more calories than your body uses. Obesity can cause serious health problems. Changing your lifestyle can help to treat obesity. Follow these instructions at home: Eating and drinking   Follow advice from your doctor about what to eat and drink. Your doctor may tell you to: ? Cut down on (limit) fast foods, sweets, and processed snack foods. ? Choose low-fat options. For example, choose low-fat milk instead of whole milk. ? Eat 5 or more servings of fruits or vegetables every day. ? Eat at home more often. This gives you more control over what you eat. ? Choose healthy foods when you eat out. ? Learn what a healthy portion size is. A portion size is the amount of a certain food that is healthy for you to eat at one time. This is different for each person. ? Keep low-fat snacks available. ? Avoid sugary drinks. These include soda, fruit juice, iced tea that is sweetened with sugar, and flavored milk. ? Eat a healthy breakfast.  Drink enough water to keep your pee (urine) clear or pale yellow.  Do not go without eating for long periods of time (do not fast).  Do not go on popular or trendy diets (fad diets). Physical Activity  Exercise often, as told by your doctor. Ask your doctor: ? What types of exercise are safe for you. ? How often you should exercise.  Warm up and stretch before being active.  Do slow stretching after being active (cool down).  Rest between times of being active. Lifestyle  Limit how much time you spend in front of your TV, computer, or video game system (be less sedentary).  Find ways to reward yourself that do not involve food.  Limit alcohol intake to no more than 1 drink a day for nonpregnant women and 2 drinks a day for men. One drink equals 12 oz  of beer, 5 oz of wine, or 1 oz of hard liquor. General instructions  Keep a weight loss journal. This can help you keep track of: ? The food that you eat. ? The exercise that you do.  Take over-the-counter and prescription medicines only as told by your doctor.  Take vitamins and supplements only as told by your doctor.  Think about joining a support group. Your doctor may be able to help with this.  Keep all follow-up visits as told by your doctor. This is important. Contact a doctor if:  You cannot meet your weight loss goal after you have changed your diet and lifestyle for 6 weeks. This information is not intended to replace advice given to you by your health care provider. Make sure you discuss any questions you have with your health care provider. Document Released: 05/26/2011 Document Revised: 08/09/2015 Document Reviewed: 12/20/2014 Elsevier Interactive Patient Education  2018 [employer]  

## 2017-08-31 NOTE — Progress Notes (Signed)
Assessment & Plan:  Tonya Wilkerson was seen today for follow-up.  Diagnoses and all orders for this visit:  Essential hypertension -     lisinopril-hydrochlorothiazide (PRINZIDE,ZESTORETIC) 20-25 MG tablet; Take 2 tablets by mouth daily. Continue all antihypertensives as prescribed.  Remember to bring in your blood pressure log with you for your follow up appointment.  DASH/Mediterranean Diets are healthier choices for HTN.   Obesity with body mass index (BMI) of 30.0 to 39.9 -     phentermine 37.5 MG capsule; Take 1 capsule (37.5 mg total) by mouth every morning. Discussed diet and exercise for person with BMI >25. Instructed: You must burn more calories than you eat. Losing 5 percent of your body weight should be considered a success. In the longer term, losing more than 15 percent of your body weight and staying at this weight is an extremely good result. However, keep in mind that even losing 5 percent of your body weight leads to important health benefits, so try not to get discouraged if you're not able to lose more than this. Will recheck weight in 3-6 months.  Routine adult health maintenance -     HIV antibody (with reflex)  Allergic rhinitis due to pollen, unspecified seasonality -     fluticasone (FLONASE) 50 MCG/ACT nasal spray; Place 1 spray into both nostrils daily.    Patient has been counseled on age-appropriate routine health concerns for screening and prevention. These are reviewed and up-to-date. Referrals have been placed accordingly. Immunizations are up-to-date or declined.    Subjective:   Chief Complaint  Patient presents with  . Follow-up    Pt. is here to follow-up on hypertension. Pt. would like to talk about weight loss.    [name] 47 y.o. female presents to office today to follow-up for hypertension.  She is also concerned about her weight and would like to discuss weight management as well today.   Essential Hypertension Chronic. Well  controlled.  She endorses medication compliance taking Prinzide 20-25 2 tablets daily and amlodipine 10 mg daily. She tends to avoid sodium but is not very exercise compliant at this time. However she has started walking. Denies chest pain, shortness of breath, palpitations, lightheadedness, dizziness, headaches or BLE edema.  BP Readings from Last 3 Encounters:  08/31/17 126/87  05/19/17 (!) 142/96  04/21/17 (!) 133/94   Obesity She is currently 300 pounds today reports this is the most she has ever weighed.  She is very frustrated with her weight and reports she has stopped eating 3 full meals a day however this has not helped her to lose weight.  Her last TSH was within normal limits and she is not diabetic. She has also started walking to help lose weight. She drinks at least 48-64 oz of water per day. She is interested today in weight loss medications. As her blood pressure is well controlled today will start her on phentermine. She is aware to continue to monitor her blood pressure and heart rate at home at phentermine can increase both substantially.    Review of Systems  Constitutional: Negative for fever, malaise/fatigue and weight loss.  HENT: Negative.  Negative for nosebleeds.        Rhinorrhea  Eyes: Negative.  Negative for blurred vision, double vision and photophobia.  Respiratory: Positive for cough ("almost feels like a tickle"). Negative for shortness of breath and wheezing.   Cardiovascular: Negative.  Negative for chest pain, palpitations and leg swelling.  Gastrointestinal: Negative.  Negative  for heartburn, nausea and vomiting.  Musculoskeletal: Negative.  Negative for myalgias.  Neurological: Negative.  Negative for dizziness, focal weakness, seizures and headaches.  Psychiatric/Behavioral: Negative.  Negative for suicidal ideas.    Past Medical History:  Diagnosis Date  . Anemia   . Asthma   . Hematuria   . Hyperbilirubinemia   . Hypertension   . Morbid obesity  with BMI of 40.0-44.9, adult ([place])   . Pneumonia due to infectious organism     Past Surgical History:  Procedure Laterality Date  . TUBAL LIGATION      Family History  Problem Relation Age of Onset  . Hypertension Mother   . Stroke Mother   . Diabetes Maternal Aunt   . Cancer Maternal Grandfather     Social History Reviewed with no changes to be made today.   Outpatient Medications Prior to Visit  Medication Sig Dispense Refill  . amLODipine (NORVASC) 10 MG tablet Take 10 mg by mouth daily.    . busPIRone (BUSPAR) 15 MG tablet TAKE 1 TABLET(15 MG) BY MOUTH THREE TIMES DAILY 90 tablet 0  . cetirizine (ZYRTEC) 10 MG tablet TAKE 1 TABLET(10 MG) BY MOUTH DAILY 90 tablet 0  . fluticasone furoate-vilanterol (BREO ELLIPTA) 100-25 MCG/INH AEPB Inhale 1 puff into the lungs daily. 60 each 3  . lisinopril-hydrochlorothiazide (PRINZIDE,ZESTORETIC) 20-25 MG tablet Take 2 tablets by mouth daily. 90 tablet 1  . traZODone (DESYREL) 100 MG tablet Take 1 tablet (100 mg total) by mouth at bedtime. (Patient not taking: Reported on 05/19/2017) 90 tablet 1   No facility-administered medications prior to visit.     No Known Allergies     Objective:    BP 126/87 (BP Location: Left Arm, Patient Position: Sitting, Cuff Size: Large)   Pulse 93   Temp 98.9 F (37.2 C) (Oral)   Ht 6\' 1"  (1.854 m)   Wt (!) 300 lb 6.4 oz (136.3 kg)   SpO2 96%   BMI 39.63 kg/m  Wt Readings from Last 3 Encounters:  08/31/17 (!) 300 lb 6.4 oz (136.3 kg)  05/19/17 299 lb 12.8 oz (136 kg)  04/21/17 298 lb 9.6 oz (135.4 kg)    Physical Exam  Constitutional: She is oriented to person, place, and time. She appears well-developed and well-nourished. She is cooperative.  HENT:  Head: Normocephalic and atraumatic.  Nose: Rhinorrhea present.  Eyes: EOM are normal.  Neck: Normal range of motion.  Cardiovascular: Normal rate, regular rhythm and normal heart sounds. Exam reveals no gallop and no friction rub.  No murmur  heard. Pulmonary/Chest: Effort normal and breath sounds normal. No stridor. No tachypnea. No respiratory distress. She has no decreased breath sounds. She has no wheezes. She has no rhonchi. She has no rales. She exhibits no tenderness.  Abdominal: Bowel sounds are normal.  Musculoskeletal: Normal range of motion. She exhibits no edema.  Neurological: She is alert and oriented to person, place, and time. Coordination normal.  Skin: Skin is warm and dry.  Psychiatric: She has a normal mood and affect. Her behavior is normal. Judgment and thought content normal.  Nursing note and vitals reviewed.     Patient has been counseled extensively about nutrition and exercise as well as the importance of adherence with medications and regular follow-up. The patient was given clear instructions to go to ER or return to medical center if symptoms don't improve, worsen or new problems develop. The patient verbalized understanding.   Follow-up: Return in about 3 months (around 12/01/2017)  for weight loss management .  Goal weight loss >15lbs.   Gildardo Pounds, FNP-BC [facility] and [place], [place]   08/31/2017, 5:44 PM

## 2017-09-01 LAB — HIV ANTIBODY (ROUTINE TESTING W REFLEX): HIV Screen 4th Generation wRfx: NONREACTIVE

## 2017-09-14 DIAGNOSIS — G4733 Obstructive sleep apnea (adult) (pediatric): Secondary | ICD-10-CM | POA: Diagnosis not present

## 2017-10-09 DIAGNOSIS — D473 Essential (hemorrhagic) thrombocythemia: Secondary | ICD-10-CM | POA: Diagnosis not present

## 2017-10-09 DIAGNOSIS — N920 Excessive and frequent menstruation with regular cycle: Secondary | ICD-10-CM | POA: Diagnosis not present

## 2017-10-09 DIAGNOSIS — Z7289 Other problems related to lifestyle: Secondary | ICD-10-CM | POA: Diagnosis not present

## 2017-10-09 DIAGNOSIS — Z87891 Personal history of nicotine dependence: Secondary | ICD-10-CM | POA: Diagnosis not present

## 2017-10-09 DIAGNOSIS — D500 Iron deficiency anemia secondary to blood loss (chronic): Secondary | ICD-10-CM | POA: Diagnosis not present

## 2017-10-15 DIAGNOSIS — G4733 Obstructive sleep apnea (adult) (pediatric): Secondary | ICD-10-CM | POA: Diagnosis not present

## 2017-11-15 DIAGNOSIS — G4733 Obstructive sleep apnea (adult) (pediatric): Secondary | ICD-10-CM | POA: Diagnosis not present

## 2017-12-01 ENCOUNTER — Encounter: Payer: Self-pay | Admitting: Nurse Practitioner

## 2017-12-01 ENCOUNTER — Ambulatory Visit: Payer: Medicaid Other | Attending: Nurse Practitioner | Admitting: Nurse Practitioner

## 2017-12-01 VITALS — BP 148/97 | HR 86 | Temp 98.3°F | Ht 73.0 in | Wt 292.8 lb

## 2017-12-01 DIAGNOSIS — Z833 Family history of diabetes mellitus: Secondary | ICD-10-CM | POA: Insufficient documentation

## 2017-12-01 DIAGNOSIS — Z8249 Family history of ischemic heart disease and other diseases of the circulatory system: Secondary | ICD-10-CM | POA: Diagnosis not present

## 2017-12-01 DIAGNOSIS — Z6835 Body mass index (BMI) 35.0-35.9, adult: Secondary | ICD-10-CM | POA: Diagnosis not present

## 2017-12-01 DIAGNOSIS — Z79899 Other long term (current) drug therapy: Secondary | ICD-10-CM | POA: Insufficient documentation

## 2017-12-01 DIAGNOSIS — I10 Essential (primary) hypertension: Secondary | ICD-10-CM | POA: Insufficient documentation

## 2017-12-01 DIAGNOSIS — Z6841 Body Mass Index (BMI) 40.0 and over, adult: Secondary | ICD-10-CM | POA: Insufficient documentation

## 2017-12-01 DIAGNOSIS — Z823 Family history of stroke: Secondary | ICD-10-CM | POA: Insufficient documentation

## 2017-12-01 MED ORDER — PHENTERMINE HCL 37.5 MG PO TABS: 37.5000 mg | ORAL_TABLET | Freq: Every day | ORAL | 0 refills | Status: DC

## 2017-12-01 NOTE — Progress Notes (Signed)
Assessment & Plan:  Tonya Wilkerson was seen today for follow-up.  Diagnoses and all orders for this visit:  Essential hypertension Chronic.  -     CMP14+EGFR Continue all antihypertensives as prescribed.  Remember to bring in your blood pressure log with you for your follow up appointment.  DASH/Mediterranean Diets are healthier choices for HTN.  BP Readings from Last 3 Encounters:  12/01/17 (!) 148/97  08/31/17 126/87  05/19/17 (!) 142/96    Severe obesity (BMI 35.0-35.9 with comorbidity) (HCC) -     phentermine (ADIPEX-P) 37.5 MG tablet; Take 1 tablet (37.5 mg total) by mouth daily before breakfast. Discussed diet and exercise for person with BMI >25. Instructed: You must burn more calories than you eat. Losing 5 percent of your body weight should be considered a success. In the longer term, losing more than 15 percent of your body weight and staying at this weight is an extremely good result. However, keep in mind that even losing 5 percent of your body weight leads to important health benefits, so try not to get discouraged if you're not able to lose more than this. Will recheck weight in 2 months.    Patient has been counseled on age-appropriate routine health concerns for screening and prevention. These are reviewed and up-to-date. Referrals have been placed accordingly. Immunizations are up-to-date or declined.    Subjective:   Chief Complaint  Patient presents with  . Follow-up    Pt. is here to follow-up on weight loss.    [name] 47 y.o. female presents to office today for follow up to weight loss. She is losing weight however feels the phentermine is not as effective as it was initially when she first started taking it. She has lost 8lbs. We will switch to the tablet from the capsule today. If she does not continue to lose weight, we may try qsymia. She denies any side effects aside from dry mouth with taking phentermine. Her blood pressure is slightly elevated today  however she endorses increased stress. She is taking her antihypertensives as prescribed.   Review of Systems  Constitutional: Negative for fever, malaise/fatigue and weight loss.  HENT: Positive for congestion and sinus pain. Negative for nosebleeds.   Eyes: Negative.  Negative for blurred vision, double vision and photophobia.  Respiratory: Negative.  Negative for cough and shortness of breath.   Cardiovascular: Negative.  Negative for chest pain, palpitations and leg swelling.  Gastrointestinal: Negative.  Negative for heartburn, nausea and vomiting.  Musculoskeletal: Negative.  Negative for myalgias.  Neurological: Negative.  Negative for dizziness, focal weakness, seizures and headaches.  Psychiatric/Behavioral: Negative.  Negative for suicidal ideas.    Past Medical History:  Diagnosis Date  . Anemia   . Asthma   . Hematuria   . Hyperbilirubinemia   . Hypertension   . Morbid obesity with BMI of 40.0-44.9, adult ([place])   . Pneumonia due to infectious organism     Past Surgical History:  Procedure Laterality Date  . TUBAL LIGATION      Family History  Problem Relation Age of Onset  . Hypertension Mother   . Stroke Mother   . Diabetes Maternal Aunt   . Cancer Maternal Grandfather     Social History Reviewed with no changes to be made today.   Outpatient Medications Prior to Visit  Medication Sig Dispense Refill  . amLODipine (NORVASC) 10 MG tablet Take 10 mg by mouth daily.    . busPIRone (BUSPAR) 15 MG tablet TAKE  1 TABLET(15 MG) BY MOUTH THREE TIMES DAILY 90 tablet 0  . cetirizine (ZYRTEC) 10 MG tablet TAKE 1 TABLET(10 MG) BY MOUTH DAILY 90 tablet 0  . fluticasone furoate-vilanterol (BREO ELLIPTA) 100-25 MCG/INH AEPB Inhale 1 puff into the lungs daily. 60 each 3  . lisinopril-hydrochlorothiazide (PRINZIDE,ZESTORETIC) 20-25 MG tablet Take 2 tablets by mouth daily. 90 tablet 1  . traZODone (DESYREL) 100 MG tablet Take 1 tablet (100 mg total) by mouth at bedtime. 90  tablet 1  . phentermine 37.5 MG capsule Take 1 capsule (37.5 mg total) by mouth every morning. 90 capsule 0  . fluticasone (FLONASE) 50 MCG/ACT nasal spray Place 1 spray into both nostrils daily. 16 g 2   No facility-administered medications prior to visit.     No Known Allergies     Objective:    BP (!) 148/97 (BP Location: Left Arm, Patient Position: Sitting, Cuff Size: Large)   Pulse 86   Temp 98.3 F (36.8 C) (Oral)   Ht _0  (1.854 m)   Wt 292 lb 12.8 oz (132.8 kg)   SpO2 98%   BMI 38.63 kg/m  Wt Readings from Last 3 Encounters:  12/01/17 292 lb 12.8 oz (132.8 kg)  08/31/17 (!) 300 lb 6.4 oz (136.3 kg)  05/19/17 299 lb 12.8 oz (136 kg)    Physical Exam  Constitutional: She is oriented to person, place, and time. She appears well-developed and well-nourished. She is cooperative.  HENT:  Head: Normocephalic and atraumatic.  Eyes: EOM are normal.  Neck: Normal range of motion.  Cardiovascular: Normal rate, regular rhythm and normal heart sounds. Exam reveals no gallop and no friction rub.  No murmur heard. Pulmonary/Chest: Effort normal and breath sounds normal. No tachypnea. No respiratory distress. She has no decreased breath sounds. She has no wheezes. She has no rhonchi. She has no rales. She exhibits no tenderness.  Abdominal: Bowel sounds are normal.  Musculoskeletal: Normal range of motion. She exhibits no edema.  Neurological: She is alert and oriented to person, place, and time. Coordination normal.  Skin: Skin is warm and dry.  Psychiatric: She has a normal mood and affect. Her behavior is normal. Judgment and thought content normal.  Nursing note and vitals reviewed.      Patient has been counseled extensively about nutrition and exercise as well as the importance of adherence with medications and regular follow-up. The patient was given clear instructions to go to ER or return to medical center if symptoms don't improve, worsen or new problems develop. The  patient verbalized understanding.   Follow-up: Return in about 3 months (around 03/02/2018) for Physical.   Gildardo Pounds, FNP-BC [facility] and [place], [place]   12/01/2017, 2:35 PM

## 2017-12-02 LAB — CMP14+EGFR
ALT: 18 IU/L (ref 0–32)
AST: 21 IU/L (ref 0–40)
Albumin/Globulin Ratio: 1.8 (ref 1.2–2.2)
Albumin: 4.7 g/dL (ref 3.5–5.5)
Alkaline Phosphatase: 95 IU/L (ref 39–117)
BUN/Creatinine Ratio: 9 (ref 9–23)
BUN: 8 mg/dL (ref 6–24)
CALCIUM: 9.4 mg/dL (ref 8.7–10.2)
CO2: 21 mmol/L (ref 20–29)
Chloride: 102 mmol/L (ref 96–106)
Creatinine, Ser: 0.87 mg/dL (ref 0.57–1.00)
GFR calc Af Amer: 92 mL/min/{1.73_m2} (ref >59)
GFR, EST NON AFRICAN AMERICAN: 80 mL/min/{1.73_m2} (ref >59)
Globulin, Total: 2.6 g/dL (ref 1.5–4.5)
Glucose: 82 mg/dL (ref 65–99)
POTASSIUM: 4.6 mmol/L (ref 3.5–5.2)
Sodium: 140 mmol/L (ref 134–144)
Total Protein: 7.3 g/dL (ref 6.0–8.5)

## 2017-12-15 DIAGNOSIS — G4733 Obstructive sleep apnea (adult) (pediatric): Secondary | ICD-10-CM | POA: Diagnosis not present

## 2017-12-29 ENCOUNTER — Other Ambulatory Visit: Payer: Self-pay | Admitting: Nurse Practitioner

## 2017-12-29 ENCOUNTER — Encounter: Payer: Self-pay | Admitting: Nurse Practitioner

## 2017-12-29 DIAGNOSIS — Z7189 Other specified counseling: Secondary | ICD-10-CM

## 2017-12-29 NOTE — Telephone Encounter (Signed)
Referral request

## 2018-01-14 DIAGNOSIS — H5213 Myopia, bilateral: Secondary | ICD-10-CM | POA: Diagnosis not present

## 2018-01-15 DIAGNOSIS — G4733 Obstructive sleep apnea (adult) (pediatric): Secondary | ICD-10-CM | POA: Diagnosis not present

## 2018-02-02 ENCOUNTER — Encounter (HOSPITAL_COMMUNITY): Payer: Self-pay | Admitting: Licensed Clinical Social Worker

## 2018-02-02 ENCOUNTER — Ambulatory Visit (INDEPENDENT_AMBULATORY_CARE_PROVIDER_SITE_OTHER): Payer: Medicaid Other | Admitting: Licensed Clinical Social Worker

## 2018-02-02 DIAGNOSIS — F4323 Adjustment disorder with mixed anxiety and depressed mood: Secondary | ICD-10-CM | POA: Diagnosis not present
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
## 2018-02-04 DIAGNOSIS — H52203 Unspecified astigmatism, bilateral: Secondary | ICD-10-CM | POA: Diagnosis not present

## 2018-02-14 DIAGNOSIS — G4733 Obstructive sleep apnea (adult) (pediatric): Secondary | ICD-10-CM | POA: Diagnosis not present

## 2018-02-19 DIAGNOSIS — G2581 Restless legs syndrome: Secondary | ICD-10-CM | POA: Diagnosis not present

## 2018-02-19 DIAGNOSIS — I10 Essential (primary) hypertension: Secondary | ICD-10-CM | POA: Diagnosis not present

## 2018-02-19 DIAGNOSIS — G4733 Obstructive sleep apnea (adult) (pediatric): Secondary | ICD-10-CM | POA: Diagnosis not present

## 2018-02-19 DIAGNOSIS — R0683 Snoring: Secondary | ICD-10-CM | POA: Diagnosis not present

## 2018-02-19 DIAGNOSIS — G4761 Periodic limb movement disorder: Secondary | ICD-10-CM | POA: Diagnosis not present

## 2018-02-23 ENCOUNTER — Ambulatory Visit (INDEPENDENT_AMBULATORY_CARE_PROVIDER_SITE_OTHER): Payer: Medicaid Other | Admitting: Licensed Clinical Social Worker

## 2018-02-23 DIAGNOSIS — F4323 Adjustment disorder with mixed anxiety and depressed mood: Secondary | ICD-10-CM

## 2018-02-23 NOTE — Progress Notes (Signed)
   THERAPIST PROGRESS NOTE  Session Time: 1:04pm-2:02pm  Participation Level: Active  Behavioral Response: CasualAlert  Mostly euthymic  Type of Therapy: Individual Therapy  Treatment Goals addressed: Self-care  Interventions: Treatment planning, goal setting    Suicidal/Homicidal: Denied both  Therapist Interventions: Collaborated with patient to develop a treatment plan.  Briefly described interventions she can expect as she participates in therapy. Introduced a tool called the Depression Insurance risk surveyor.  Explained how it is important to set reasonable goals when you are depressed and give yourself credit for what you are accomplishing despite how you feel.   Assigned homework for patient to develop goals in each of the 5 categories.  Summary: Decided on goals to increase activity and satisfaction with accomplishments.  Indicated she thought some of the interventions described could be helpful. Reflected back on a year ago when she had aspirations of exercising daily for a half hour.  Had trouble staying consistent and eventually stopped working on that goal.  Acknowledged she probably set a goal that was not achievable considering how stressed she was at the time.   Patient described an incident at work where she ended up overreacting to a mistake a coworker made.  Reported in the moment it felt like "her world was crashing down on her."  Now she can look back on the situation and laugh.      Plan: Return in approximately 2 weeks.  Will continue with Depression Self-Care Action Plan Treatment plan review is due 05/25/18    Diagnosis: Adjustment disorder with mixed anxiety and depressed mood   Armandina Stammer 02/23/2018

## 2018-03-02 ENCOUNTER — Encounter: Payer: Self-pay | Admitting: Nurse Practitioner

## 2018-03-02 ENCOUNTER — Other Ambulatory Visit: Payer: Self-pay

## 2018-03-02 ENCOUNTER — Ambulatory Visit: Payer: Medicaid Other | Attending: Nurse Practitioner | Admitting: Nurse Practitioner

## 2018-03-02 VITALS — BP 141/90 | HR 88 | Temp 98.8°F | Ht 73.0 in | Wt 297.6 lb

## 2018-03-02 DIAGNOSIS — Z1231 Encounter for screening mammogram for malignant neoplasm of breast: Secondary | ICD-10-CM | POA: Diagnosis not present

## 2018-03-02 DIAGNOSIS — I10 Essential (primary) hypertension: Secondary | ICD-10-CM | POA: Diagnosis not present

## 2018-03-02 DIAGNOSIS — Z0000 Encounter for general adult medical examination without abnormal findings: Secondary | ICD-10-CM

## 2018-03-02 NOTE — Progress Notes (Signed)
Assessment & Plan:  Tonya Wilkerson was seen today for annual exam.  Diagnoses and all orders for this visit:  Encounter for annual physical exam  Essential hypertension Noncompliant with CPAP use She will monitor blood pressure at home for the next 2 weeks with compliant CPAP use and send BP readings through my chart.   Patient has been counseled on age-appropriate routine health concerns for screening and prevention. These are reviewed and up-to-date. Referrals have been placed accordingly. Immunizations are up-to-date or declined.    Subjective:   Chief Complaint  Patient presents with  . Annual Exam   [name] 47 y.o. female presents to office today for annual physical exam.   Review of Systems  Constitutional: Negative.  Negative for chills, fever, malaise/fatigue and weight loss.  HENT: Negative.  Negative for congestion, hearing loss, sinus pain and sore throat.   Eyes: Negative.  Negative for blurred vision, double vision, photophobia and pain.  Respiratory: Negative.  Negative for cough, sputum production, shortness of breath and wheezing.   Cardiovascular: Negative.  Negative for chest pain and leg swelling.  Gastrointestinal: Negative.  Negative for abdominal pain, constipation, diarrhea, heartburn, nausea and vomiting.  Genitourinary: Negative.   Musculoskeletal: Negative.  Negative for joint pain and myalgias.  Skin: Negative.  Negative for rash.  Neurological: Positive for dizziness. Negative for tremors, speech change, focal weakness, seizures and headaches.  Endo/Heme/Allergies: Negative.  Negative for environmental allergies.  Psychiatric/Behavioral: Positive for depression (currently seeing a therapist. Her mother died unexpectedly last year). Negative for suicidal ideas. The patient is not nervous/anxious and does not have insomnia.     Past Medical History:  Diagnosis Date  . Anemia   . Asthma   . Hematuria   . Hyperbilirubinemia   . Hypertension   .  Morbid obesity with BMI of 40.0-44.9, adult ([place])   . Pneumonia due to infectious organism     Past Surgical History:  Procedure Laterality Date  . TUBAL LIGATION      Family History  Problem Relation Age of Onset  . Hypertension Mother   . Stroke Mother   . Diabetes Maternal Aunt   . Cancer Maternal Grandfather     Social History Reviewed with no changes to be made today.   Outpatient Medications Prior to Visit  Medication Sig Dispense Refill  . amLODipine (NORVASC) 10 MG tablet Take 10 mg by mouth daily.    . busPIRone (BUSPAR) 15 MG tablet TAKE 1 TABLET(15 MG) BY MOUTH THREE TIMES DAILY 90 tablet 0  . fluticasone furoate-vilanterol (BREO ELLIPTA) 100-25 MCG/INH AEPB Inhale 1 puff into the lungs daily. 60 each 3  . lisinopril-hydrochlorothiazide (PRINZIDE,ZESTORETIC) 20-25 MG tablet Take 2 tablets by mouth daily. 90 tablet 1  . traZODone (DESYREL) 100 MG tablet Take 1 tablet (100 mg total) by mouth at bedtime. 90 tablet 1  . cetirizine (ZYRTEC) 10 MG tablet TAKE 1 TABLET(10 MG) BY MOUTH DAILY (Patient not taking: Reported on 03/02/2018) 90 tablet 0  . fluticasone (FLONASE) 50 MCG/ACT nasal spray Place 1 spray into both nostrils daily. 16 g 2  . phentermine (ADIPEX-P) 37.5 MG tablet Take 1 tablet (37.5 mg total) by mouth daily before breakfast. 60 tablet 0   No facility-administered medications prior to visit.     No Known Allergies     Objective:    BP (!) 141/90 (BP Location: Right Arm)   Pulse 88   Temp 98.8 F (37.1 C) (Oral)   Ht 6\' 1"  (1.854 m)  Wt 297 lb 9.6 oz (135 kg)   LMP 02/27/2018 (Approximate)   SpO2 100%   BMI 39.26 kg/m  Wt Readings from Last 3 Encounters:  03/02/18 297 lb 9.6 oz (135 kg)  12/01/17 292 lb 12.8 oz (132.8 kg)  08/31/17 (!) 300 lb 6.4 oz (136.3 kg)   155/113   Physical Exam Constitutional:      Appearance: She is well-developed.  HENT:     Head: Normocephalic and atraumatic.     Jaw: There is normal jaw occlusion.      Salivary Glands: Right salivary gland is not diffusely enlarged or tender. Left salivary gland is not diffusely enlarged or tender.     Right Ear: External ear normal.     Left Ear: External ear normal.     Nose: Nose normal.     Right Turbinates: Enlarged. Not pale.     Left Turbinates: Enlarged. Not pale.     Mouth/Throat:     Mouth: Mucous membranes are moist.     Pharynx: Oropharynx is clear. No oropharyngeal exudate.  Eyes:     General: No scleral icterus.       Right eye: No discharge.     Conjunctiva/sclera: Conjunctivae normal.     Pupils: Pupils are equal, round, and reactive to light.  Neck:     Musculoskeletal: Normal range of motion and neck supple.     Thyroid: No thyromegaly.     Trachea: No tracheal deviation.  Cardiovascular:     Rate and Rhythm: Normal rate and regular rhythm.     Heart sounds: Normal heart sounds. No murmur. No friction rub.  Pulmonary:     Effort: Pulmonary effort is normal. No accessory muscle usage or respiratory distress.     Breath sounds: Normal breath sounds. No decreased breath sounds, wheezing, rhonchi or rales.  Chest:     Chest wall: No tenderness.     Breasts: Breasts are symmetrical.        Right: No inverted nipple, mass, nipple discharge, skin change or tenderness.        Left: No inverted nipple, mass, nipple discharge, skin change or tenderness.  Abdominal:     General: Bowel sounds are normal. There is no distension.     Palpations: Abdomen is soft. There is no mass.     Tenderness: There is no abdominal tenderness. There is no guarding or rebound.  Musculoskeletal: Normal range of motion.        General: No tenderness or deformity.  Lymphadenopathy:     Cervical: No cervical adenopathy.  Skin:    General: Skin is warm and dry.     Findings: No erythema.  Neurological:     General: No focal deficit present.     Mental Status: She is alert and oriented to person, place, and time.     GCS: GCS eye subscore is 4. GCS verbal  subscore is 5. GCS motor subscore is 6.     Cranial Nerves: No cranial nerve deficit.     Sensory: Sensation is intact.     Motor: Motor function is intact.     Coordination: Coordination normal. Finger-Nose-Finger Test and Heel to Benicia Test normal.     Gait: Gait is intact.     Deep Tendon Reflexes:     Reflex Scores:      Patellar reflexes are 1+ on the right side and 1+ on the left side. Psychiatric:        Speech: Speech normal.  Behavior: Behavior normal.        Thought Content: Thought content normal.        Judgment: Judgment normal.          Patient has been counseled extensively about nutrition and exercise as well as the importance of adherence with medications and regular follow-up. The patient was given clear instructions to go to ER or return to medical center if symptoms don't improve, worsen or new problems develop. The patient verbalized understanding.   Follow-up: Return in about 3 months (around 06/01/2018) for HTN.   Gildardo Pounds, FNP-BC [facility] [place], [place]   03/02/2018, 1:48 PM

## 2018-03-02 NOTE — Patient Instructions (Signed)
Send 2 weeks of BP measurements to me through my chart

## 2018-03-16 ENCOUNTER — Ambulatory Visit (HOSPITAL_COMMUNITY): Payer: Medicaid Other | Admitting: Licensed Clinical Social Worker

## 2018-03-17 DIAGNOSIS — J189 Pneumonia, unspecified organism: Secondary | ICD-10-CM

## 2018-03-17 HISTORY — DX: Pneumonia, unspecified organism: J18.9

## 2018-03-30 ENCOUNTER — Ambulatory Visit (HOSPITAL_COMMUNITY): Payer: Medicaid Other | Admitting: Licensed Clinical Social Worker

## 2018-04-13 ENCOUNTER — Ambulatory Visit (HOSPITAL_COMMUNITY): Payer: Medicaid Other | Admitting: Licensed Clinical Social Worker

## 2018-05-19 DIAGNOSIS — R05 Cough: Secondary | ICD-10-CM | POA: Diagnosis not present

## 2018-05-19 DIAGNOSIS — J181 Lobar pneumonia, unspecified organism: Secondary | ICD-10-CM | POA: Diagnosis not present

## 2018-05-19 DIAGNOSIS — R918 Other nonspecific abnormal finding of lung field: Secondary | ICD-10-CM | POA: Diagnosis not present

## 2018-05-21 ENCOUNTER — Encounter: Payer: Self-pay | Admitting: Nurse Practitioner

## 2018-05-22 DIAGNOSIS — R0602 Shortness of breath: Secondary | ICD-10-CM | POA: Diagnosis not present

## 2018-05-22 DIAGNOSIS — R739 Hyperglycemia, unspecified: Secondary | ICD-10-CM | POA: Diagnosis not present

## 2018-05-22 DIAGNOSIS — R9431 Abnormal electrocardiogram [ECG] [EKG]: Secondary | ICD-10-CM | POA: Diagnosis not present

## 2018-05-22 DIAGNOSIS — R05 Cough: Secondary | ICD-10-CM | POA: Diagnosis not present

## 2018-05-22 DIAGNOSIS — J4522 Mild intermittent asthma with status asthmaticus: Secondary | ICD-10-CM | POA: Diagnosis not present

## 2018-05-22 DIAGNOSIS — D509 Iron deficiency anemia, unspecified: Secondary | ICD-10-CM | POA: Diagnosis not present

## 2018-05-22 DIAGNOSIS — I10 Essential (primary) hypertension: Secondary | ICD-10-CM | POA: Diagnosis not present

## 2018-05-22 DIAGNOSIS — J181 Lobar pneumonia, unspecified organism: Secondary | ICD-10-CM | POA: Diagnosis not present

## 2018-05-22 DIAGNOSIS — E872 Acidosis: Secondary | ICD-10-CM | POA: Diagnosis not present

## 2018-05-22 DIAGNOSIS — J45901 Unspecified asthma with (acute) exacerbation: Secondary | ICD-10-CM | POA: Diagnosis not present

## 2018-05-22 DIAGNOSIS — R000 Tachycardia, unspecified: Secondary | ICD-10-CM | POA: Diagnosis not present

## 2018-05-23 ENCOUNTER — Other Ambulatory Visit: Payer: Self-pay | Admitting: Nurse Practitioner

## 2018-05-23 DIAGNOSIS — E872 Acidosis: Secondary | ICD-10-CM | POA: Diagnosis not present

## 2018-05-23 DIAGNOSIS — J45901 Unspecified asthma with (acute) exacerbation: Secondary | ICD-10-CM | POA: Insufficient documentation

## 2018-05-23 DIAGNOSIS — J181 Lobar pneumonia, unspecified organism: Secondary | ICD-10-CM | POA: Diagnosis not present

## 2018-05-23 DIAGNOSIS — I10 Essential (primary) hypertension: Secondary | ICD-10-CM | POA: Diagnosis not present

## 2018-05-23 DIAGNOSIS — J4541 Moderate persistent asthma with (acute) exacerbation: Secondary | ICD-10-CM | POA: Diagnosis not present

## 2018-05-23 DIAGNOSIS — D500 Iron deficiency anemia secondary to blood loss (chronic): Secondary | ICD-10-CM | POA: Diagnosis not present

## 2018-05-23 DIAGNOSIS — R739 Hyperglycemia, unspecified: Secondary | ICD-10-CM | POA: Diagnosis not present

## 2018-05-23 DIAGNOSIS — J4522 Mild intermittent asthma with status asthmaticus: Secondary | ICD-10-CM | POA: Diagnosis not present

## 2018-05-23 DIAGNOSIS — B9781 Human metapneumovirus as the cause of diseases classified elsewhere: Secondary | ICD-10-CM | POA: Diagnosis not present

## 2018-05-23 DIAGNOSIS — R000 Tachycardia, unspecified: Secondary | ICD-10-CM | POA: Diagnosis not present

## 2018-05-23 MED ORDER — HYDROCODONE-HOMATROPINE 5-1.5 MG/5ML PO SYRP: 5.0000 mL | ORAL_SOLUTION | Freq: Three times a day (TID) | ORAL | 0 refills | Status: AC | PRN

## 2018-05-24 DIAGNOSIS — R739 Hyperglycemia, unspecified: Secondary | ICD-10-CM | POA: Diagnosis not present

## 2018-05-24 DIAGNOSIS — J4541 Moderate persistent asthma with (acute) exacerbation: Secondary | ICD-10-CM | POA: Diagnosis not present

## 2018-05-24 DIAGNOSIS — D500 Iron deficiency anemia secondary to blood loss (chronic): Secondary | ICD-10-CM | POA: Diagnosis not present

## 2018-05-24 DIAGNOSIS — I10 Essential (primary) hypertension: Secondary | ICD-10-CM | POA: Diagnosis not present

## 2018-05-25 DIAGNOSIS — R739 Hyperglycemia, unspecified: Secondary | ICD-10-CM | POA: Diagnosis not present

## 2018-05-25 DIAGNOSIS — E119 Type 2 diabetes mellitus without complications: Secondary | ICD-10-CM | POA: Diagnosis not present

## 2018-05-25 DIAGNOSIS — J4541 Moderate persistent asthma with (acute) exacerbation: Secondary | ICD-10-CM | POA: Diagnosis not present

## 2018-05-25 DIAGNOSIS — I10 Essential (primary) hypertension: Secondary | ICD-10-CM | POA: Diagnosis not present

## 2018-05-25 DIAGNOSIS — D500 Iron deficiency anemia secondary to blood loss (chronic): Secondary | ICD-10-CM | POA: Diagnosis not present

## 2018-05-26 ENCOUNTER — Other Ambulatory Visit: Payer: Self-pay | Admitting: Nurse Practitioner

## 2018-05-26 ENCOUNTER — Telehealth: Payer: Self-pay | Admitting: Nurse Practitioner

## 2018-05-26 MED ORDER — ACCU-CHEK FASTCLIX LANCET KIT: 1.0000 | PACK | Freq: Once | 0 refills | Status: AC

## 2018-05-26 MED ORDER — ACCU-CHEK AVIVA DEVI: 0 refills | Status: DC

## 2018-05-26 MED ORDER — GLUCOSE BLOOD VI STRP: ORAL_STRIP | 12 refills | Status: DC

## 2018-05-26 MED ORDER — ACCU-CHEK FASTCLIX LANCETS MISC: 3 refills | Status: DC

## 2018-05-26 NOTE — Telephone Encounter (Signed)
kaylee from the pharmacy called because they would like to get the patients metter changed from the accu check aviva to accu check guide. Please follow up

## 2018-05-27 MED ORDER — ACCU-CHEK GUIDE W/DEVICE KIT: 1.0000 | PACK | Freq: Two times a day (BID) | 0 refills | Status: DC

## 2018-05-27 MED ORDER — GLUCOSE BLOOD VI STRP: ORAL_STRIP | 12 refills | Status: DC

## 2018-05-27 NOTE — Telephone Encounter (Signed)
Meter was sent to the pharmacy.  CMA called the pharmacy for confirmation.  Pharmacy confirmed they received it.  CMA attempt to call patient to inform new meter and strip were sent.  No answer and unable to leave a VM with no option.

## 2018-05-27 NOTE — Telephone Encounter (Signed)
Unable to reach patient.

## 2018-06-01 ENCOUNTER — Other Ambulatory Visit: Payer: Self-pay

## 2018-06-01 ENCOUNTER — Encounter: Payer: Self-pay | Admitting: Nurse Practitioner

## 2018-06-01 ENCOUNTER — Other Ambulatory Visit (HOSPITAL_COMMUNITY)
Admission: RE | Admit: 2018-06-01 | Discharge: 2018-06-01 | Disposition: A | Discharge status: Home / Self Care | Payer: Medicaid Other | Source: Ambulatory Visit | Attending: Nurse Practitioner | Admitting: Nurse Practitioner

## 2018-06-01 ENCOUNTER — Ambulatory Visit: Payer: Medicaid Other | Attending: Nurse Practitioner | Admitting: Nurse Practitioner

## 2018-06-01 VITALS — BP 131/90 | HR 104 | Temp 99.2°F | Ht 73.0 in | Wt 296.2 lb

## 2018-06-01 DIAGNOSIS — N760 Acute vaginitis: Secondary | ICD-10-CM | POA: Diagnosis not present

## 2018-06-01 DIAGNOSIS — Z79899 Other long term (current) drug therapy: Secondary | ICD-10-CM | POA: Insufficient documentation

## 2018-06-01 DIAGNOSIS — G4709 Other insomnia: Secondary | ICD-10-CM | POA: Diagnosis not present

## 2018-06-01 DIAGNOSIS — J4520 Mild intermittent asthma, uncomplicated: Secondary | ICD-10-CM | POA: Diagnosis not present

## 2018-06-01 DIAGNOSIS — I10 Essential (primary) hypertension: Secondary | ICD-10-CM | POA: Diagnosis not present

## 2018-06-01 DIAGNOSIS — Z23 Encounter for immunization: Secondary | ICD-10-CM | POA: Diagnosis not present

## 2018-06-01 DIAGNOSIS — R7303 Prediabetes: Secondary | ICD-10-CM | POA: Diagnosis not present

## 2018-06-01 DIAGNOSIS — Z8249 Family history of ischemic heart disease and other diseases of the circulatory system: Secondary | ICD-10-CM | POA: Insufficient documentation

## 2018-06-01 DIAGNOSIS — Z6841 Body Mass Index (BMI) 40.0 and over, adult: Secondary | ICD-10-CM | POA: Insufficient documentation

## 2018-06-01 DIAGNOSIS — Z7901 Long term (current) use of anticoagulants: Secondary | ICD-10-CM | POA: Diagnosis not present

## 2018-06-01 LAB — POCT GLYCOSYLATED HEMOGLOBIN (HGB A1C): HEMOGLOBIN A1C: 6.7 % — AB (ref 4.0–5.6)

## 2018-06-01 MED ORDER — TRAZODONE HCL 150 MG PO TABS: 150.0000 mg | ORAL_TABLET | Freq: Every day | ORAL | 1 refills | Status: DC

## 2018-06-01 MED ORDER — FLUTICASONE FUROATE-VILANTEROL 100-25 MCG/INH IN AEPB: 1.0000 | INHALATION_SPRAY | Freq: Every day | RESPIRATORY_TRACT | 3 refills | Status: DC

## 2018-06-01 MED ORDER — FLUCONAZOLE 150 MG PO TABS: 150.0000 mg | ORAL_TABLET | Freq: Once | ORAL | 1 refills | Status: AC

## 2018-06-01 NOTE — Progress Notes (Signed)
Assessment & Plan:  Tonya Wilkerson was seen today for follow-up.  Diagnoses and all orders for this visit:  Essential hypertension Continue all antihypertensives as prescribed.  Remember to bring in your blood pressure log with you for your follow up appointment.  DASH/Mediterranean Diets are healthier choices for HTN.    Acute vaginitis -     fluconazole (DIFLUCAN) 150 MG tablet; Take 1 tablet (150 mg total) by mouth once for 1 dose. -     Urine cytology ancillary only  Other insomnia -     traZODone (DESYREL) 150 MG tablet; Take 1 tablet (150 mg total) by mouth at bedtime.  Mild intermittent asthma without complication -     fluticasone furoate-vilanterol (BREO ELLIPTA) 100-25 MCG/INH AEPB; Inhale 1 puff into the lungs daily.  Prediabetes -     HgB A1c Controlled Continue medications as prescribed.  Continue blood sugar control as discussed in office today, low carbohydrate diet, and regular physical exercise as tolerated, 150 minutes per week (30 min each day, 5 days per week, or 50 min 3 days per week). Keep blood sugar logs with fasting goal of 90-130 mg/dl, post prandial (after you eat) less than 180.  For Hypoglycemia: BS <60 and Hyperglycemia BS >400; contact the clinic ASAP. Annual eye exams and foot exams are recommended.  Immunization due -     Tdap vaccine greater than or equal to 7yo IM   Patient has been counseled on age-appropriate routine health concerns for screening and prevention. These are reviewed and up-to-date. Referrals have been placed accordingly. Immunizations are up-to-date or declined.    Subjective:   Chief Complaint  Patient presents with  . Follow-up    Pt. is here for 3 months F/U on BP.    [name] 48 y.o. female presents to office today to for follow up to HTN.  Unfortunately she was admitted to the hospital on 05/22/2018 with asthma exacerbation after being diagnosed and hospitalized on 05/19/2018 with right lower lobe pneumonia.  While  in the hospital for asthma exacerbation she was treated with Solu-Medrol, 4 times daily albuterol nebs as well as Rocephin and azithromycin for secondary pneumonia.  She is currently finishing a tapering dose of prednisone. Will see pulmonology in a few weeks. Has not been using CPAP.  She currently denies any increased shortness of breath, wheezing or cough.   ESSENTIAL HYPERTENSION Chronic and well controlled. Currently endorses medication compliance taking amlodipine 70m, lisinopril-hctz 20-25 mg. Denies chest pain, shortness of breath, palpitations, lightheadedness, dizziness, headaches or BLE edema. She is not diet or exercise compliant. Her asthma keeps her from exercising as much as she would like to.  BP Readings from Last 3 Encounters:  06/01/18 131/90  03/02/18 (!) 141/90  12/01/17 (!) 148/97   Vaginitis: Patient complains of an abnormal vaginal discharge for a few weeks. Vaginal symptoms include discharge described as curd-like and local irritation.Vulvar symptoms include local irritation.STI Risk: Very low risk of STD exposure. Discharge described as: yellow.Other associated symptoms: none.Menstrual pattern: She had been bleeding irregularly. Contraception: none   Insomnia Trazodone 1045mprovides some relief of insomnia however she is not able to completely sleep throughout the night. Will increase Trazodone to 15077mowever it was also reinforced that she be compliant with the use of her CPAP.    Review of Systems  Constitutional: Negative for fever, malaise/fatigue and weight loss.  HENT: Negative.  Negative for nosebleeds.   Eyes: Negative.  Negative for blurred vision, double vision and  photophobia.  Respiratory: Positive for cough and shortness of breath (chronic mild). Negative for sputum production and wheezing.   Cardiovascular: Negative.  Negative for chest pain, palpitations and leg swelling.  Gastrointestinal: Negative.  Negative for heartburn, nausea and vomiting.   Genitourinary:       SEE HPI  Musculoskeletal: Negative.  Negative for myalgias.  Neurological: Negative.  Negative for dizziness, focal weakness, seizures and headaches.  Psychiatric/Behavioral: Negative for suicidal ideas. The patient has insomnia.     Past Medical History:  Diagnosis Date  . Anemia   . Asthma   . Hematuria   . Hyperbilirubinemia   . Hypertension   . Morbid obesity with BMI of 40.0-44.9, adult ([place])   . Pneumonia due to infectious organism     Past Surgical History:  Procedure Laterality Date  . TUBAL LIGATION      Family History  Problem Relation Age of Onset  . Hypertension Mother   . Stroke Mother   . Diabetes Maternal Aunt   . Cancer Maternal Grandfather     Social History Reviewed with no changes to be made today.   Outpatient Medications Prior to Visit  Medication Sig Dispense Refill  . Accu-Chek FastClix Lancets MISC Use as instructed. Inject into the skin twice daily 100 each 3  . amLODipine (NORVASC) 10 MG tablet Take 10 mg by mouth daily.    . Blood Glucose Monitoring Suppl (ACCU-CHEK GUIDE) w/Device KIT 1 each by Does not apply route 2 (two) times daily. 1 kit 0  . busPIRone (BUSPAR) 15 MG tablet TAKE 1 TABLET(15 MG) BY MOUTH THREE TIMES DAILY 90 tablet 0  . glucose blood (ACCU-CHEK GUIDE) test strip Use as instructed bid 100 each 12  . HYDROcodone-homatropine (HYCODAN) 5-1.5 MG/5ML syrup Take 5 mLs by mouth every 8 (eight) hours as needed for up to 14 days for cough. 240 mL 0  . lisinopril-hydrochlorothiazide (PRINZIDE,ZESTORETIC) 20-25 MG tablet Take 2 tablets by mouth daily. 90 tablet 1  . fluticasone furoate-vilanterol (BREO ELLIPTA) 100-25 MCG/INH AEPB Inhale 1 puff into the lungs daily. 60 each 3  . traZODone (DESYREL) 100 MG tablet Take 1 tablet (100 mg total) by mouth at bedtime. 90 tablet 1  . cetirizine (ZYRTEC) 10 MG tablet TAKE 1 TABLET(10 MG) BY MOUTH DAILY (Patient not taking: Reported on 03/02/2018) 90 tablet 0  . fluticasone  (FLONASE) 50 MCG/ACT nasal spray Place 1 spray into both nostrils daily. 16 g 2   No facility-administered medications prior to visit.     No Known Allergies     Objective:    BP 131/90 (BP Location: Right Arm, Patient Position: Sitting, Cuff Size: Large)   Pulse (!) 104   Temp 99.2 F (37.3 C) (Oral)   Ht '6\' 1"'  (1.854 m)   Wt 296 lb 3.2 oz (134.4 kg)   SpO2 99%   BMI 39.08 kg/m  Wt Readings from Last 3 Encounters:  06/01/18 296 lb 3.2 oz (134.4 kg)  03/02/18 297 lb 9.6 oz (135 kg)  12/01/17 292 lb 12.8 oz (132.8 kg)    Physical Exam Vitals signs and nursing note reviewed.  Constitutional:      Appearance: She is well-developed.  HENT:     Head: Normocephalic and atraumatic.  Neck:     Musculoskeletal: Normal range of motion.  Cardiovascular:     Rate and Rhythm: Regular rhythm. Tachycardia present.     Heart sounds: Normal heart sounds. No murmur. No friction rub. No gallop.   Pulmonary:  Effort: Pulmonary effort is normal. No tachypnea or respiratory distress.     Breath sounds: Normal breath sounds. No decreased breath sounds, wheezing, rhonchi or rales.  Chest:     Chest wall: No tenderness.  Abdominal:     General: Bowel sounds are normal.     Palpations: Abdomen is soft.  Musculoskeletal: Normal range of motion.  Skin:    General: Skin is warm and dry.  Neurological:     Mental Status: She is alert and oriented to person, place, and time.     Coordination: Coordination normal.  Psychiatric:        Behavior: Behavior normal. Behavior is cooperative.        Thought Content: Thought content normal.        Judgment: Judgment normal.          Patient has been counseled extensively about nutrition and exercise as well as the importance of adherence with medications and regular follow-up. The patient was given clear instructions to go to ER or return to medical center if symptoms don't improve, worsen or new problems develop. The patient verbalized  understanding.   Follow-up: Return in about 6 weeks (around 07/13/2018).   Gildardo Pounds, FNP-BC [facility] and [facility], [place]   06/02/2018, 7:08 PM

## 2018-06-02 ENCOUNTER — Encounter: Payer: Self-pay | Admitting: Nurse Practitioner

## 2018-06-02 LAB — URINE CYTOLOGY ANCILLARY ONLY
Bacterial vaginitis: POSITIVE — AB
Candida vaginitis: NEGATIVE
Chlamydia: NEGATIVE
Neisseria Gonorrhea: NEGATIVE
Trichomonas: NEGATIVE

## 2018-06-07 ENCOUNTER — Other Ambulatory Visit: Payer: Self-pay | Admitting: Nurse Practitioner

## 2018-06-07 MED ORDER — METRONIDAZOLE 500 MG PO TABS: 500.0000 mg | ORAL_TABLET | Freq: Two times a day (BID) | ORAL | 0 refills | Status: AC

## 2018-06-18 ENCOUNTER — Other Ambulatory Visit: Payer: Self-pay | Admitting: Nurse Practitioner

## 2018-06-18 DIAGNOSIS — J301 Allergic rhinitis due to pollen: Secondary | ICD-10-CM

## 2018-06-18 MED ORDER — FLUTICASONE PROPIONATE 50 MCG/ACT NA SUSP: 1.0000 | Freq: Every day | NASAL | 2 refills | Status: DC

## 2018-06-18 MED ORDER — MONTELUKAST SODIUM 10 MG PO TABS: 10.0000 mg | ORAL_TABLET | Freq: Every day | ORAL | 3 refills | Status: DC

## 2018-06-18 MED ORDER — CETIRIZINE HCL 10 MG PO TABS: ORAL_TABLET | ORAL | 0 refills | Status: DC

## 2018-07-13 ENCOUNTER — Other Ambulatory Visit: Payer: Self-pay

## 2018-07-13 ENCOUNTER — Ambulatory Visit: Payer: Medicaid Other | Attending: Nurse Practitioner | Admitting: Nurse Practitioner

## 2018-07-13 ENCOUNTER — Encounter: Payer: Self-pay | Admitting: Nurse Practitioner

## 2018-07-13 ENCOUNTER — Other Ambulatory Visit (HOSPITAL_COMMUNITY)
Admission: RE | Admit: 2018-07-13 | Discharge: 2018-07-13 | Disposition: A | Discharge status: Home / Self Care | Payer: Medicaid Other | Source: Ambulatory Visit | Attending: Nurse Practitioner | Admitting: Nurse Practitioner

## 2018-07-13 DIAGNOSIS — M545 Low back pain: Secondary | ICD-10-CM | POA: Insufficient documentation

## 2018-07-13 DIAGNOSIS — D649 Anemia, unspecified: Secondary | ICD-10-CM | POA: Insufficient documentation

## 2018-07-13 DIAGNOSIS — Z6841 Body Mass Index (BMI) 40.0 and over, adult: Secondary | ICD-10-CM | POA: Insufficient documentation

## 2018-07-13 DIAGNOSIS — J45909 Unspecified asthma, uncomplicated: Secondary | ICD-10-CM | POA: Insufficient documentation

## 2018-07-13 DIAGNOSIS — R109 Unspecified abdominal pain: Secondary | ICD-10-CM | POA: Diagnosis not present

## 2018-07-13 DIAGNOSIS — Z87891 Personal history of nicotine dependence: Secondary | ICD-10-CM | POA: Insufficient documentation

## 2018-07-13 DIAGNOSIS — I10 Essential (primary) hypertension: Secondary | ICD-10-CM | POA: Insufficient documentation

## 2018-07-13 DIAGNOSIS — Z8249 Family history of ischemic heart disease and other diseases of the circulatory system: Secondary | ICD-10-CM | POA: Diagnosis not present

## 2018-07-13 MED ORDER — LISINOPRIL-HYDROCHLOROTHIAZIDE 20-25 MG PO TABS: 2.0000 | ORAL_TABLET | Freq: Every day | ORAL | 1 refills | Status: DC

## 2018-07-13 MED ORDER — CYCLOBENZAPRINE HCL 5 MG PO TABS: 5.0000 mg | ORAL_TABLET | Freq: Three times a day (TID) | ORAL | 1 refills | Status: DC | PRN

## 2018-07-13 NOTE — Progress Notes (Signed)
Virtual Visit via Telephone Note Due to national recommendations of social distancing due to [place] 19, telehealth visit is felt to be most appropriate for this patient at this time.  I discussed the limitations, risks, security and privacy concerns of performing an evaluation and management service by telephone and the availability of in person appointments. I also discussed with the patient that there may be a patient responsible charge related to this service. The patient expressed understanding and agreed to proceed.    I connected with Tonya Wilkerson on 07/13/18  at   1:30 PM EDT  EDT by telephone and verified that I am speaking with the correct person using two identifiers.   Consent I discussed the limitations, risks, security and privacy concerns of performing an evaluation and management service by telephone and the availability of in person appointments. I also discussed with the patient that there may be a patient responsible charge related to this service. The patient expressed understanding and agreed to proceed.   Location of Patient: [place] of Provider: [employer] [facility] [employer] Office    Persons participating in Telemedicine visit: [name] FNP-BC [place]    History of Present Illness: Telemedicine visit for: HTN follow up. She has complaints of bilateral flank pain. 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  Symptoms have been present for several days and include pain/aching in bilateral flank area. Initial inciting event: none. Symptoms are worst: no particular time of the day. Alleviating factors identifiable by patient are none. Exacerbating factors identifiable by patient are none. Treatments so far initiated by patient: none Previous lower back problems: none. Previous workup: none. Previous treatments: none.   Essential Hypertension She checks her  blood pressure periodically. Reports SBP consistently in the 140s. She is also not using her CPAP as prescribed. I would like to have her Sleep Apnea and CPAP use addressed prior to adjusting her blood pressure medications. She currently denies chest pain, shortness of breath, palpitations, lightheadedness, dizziness, headaches or BLE edema.     Past Medical History:  Diagnosis Date  . Anemia   . Asthma   . Hematuria   . Hyperbilirubinemia   . Hypertension   . Morbid obesity with BMI of 40.0-44.9, adult ([place])   . Pneumonia due to infectious organism     Past Surgical History:  Procedure Laterality Date  . TUBAL LIGATION      Family History  Problem Relation Age of Onset  . Hypertension Mother   . Stroke Mother   . Diabetes Maternal Aunt   . Cancer Maternal Grandfather     Social History   Socioeconomic History  . Marital status: Divorced    Spouse name: Not on file  . Number of children: Not on file  . Years of education: Not on file  . Highest education level: Not on file  Occupational History  . Not on file  Social Needs  . Financial resource strain: Not on file  . Food insecurity:    Worry: Not on file    Inability: Not on file  . Transportation needs:    Medical: Not on file    Non-medical: Not on file  Tobacco Use  . Smoking status: Former Research scientist (life sciences)  . Smokeless tobacco: Never Used  Substance and Sexual Activity  . Alcohol use: No    Frequency: Never  . Drug use: No  . Sexual activity: Never  Lifestyle  . Physical activity:    Days per week: Not on file    Minutes per session: Not on file  . Stress: Not on file  Relationships  . Social connections:    Talks on phone: Not on file    Gets together: Not on file    Attends religious service: Not on file    Active member of club or organization: Not on file    Attends meetings of clubs or organizations: Not on file    Relationship status: Not on file  Other Topics Concern  . Not on file  Social History  Narrative  . Not on file     Observations/Objective: Awake, alert and oriented x 3   ROS  Assessment and Plan: Tonya Wilkerson was seen today for follow-up.  Diagnoses and all orders for this visit:  Bilateral flank pain -     Urine cytology ancillary only -     Urinalysis, Routine w reflex microscopic -     cyclobenzaprine (FLEXERIL) 5 MG tablet; Take 1 tablet (5 mg total) by mouth 3 (three) times daily as needed for muscle spasms. Work on losing weight to help reduce back pain. May alternate with heat and ice application for pain relief. May also alternate with acetaminophen as prescribed for back pain. Other alternatives include massage, acupuncture and water aerobics.  You must stay active and avoid a sedentary lifestyle.   Essential hypertension -     lisinopril-hydrochlorothiazide (ZESTORETIC) 20-25 MG tablet; Take 2 tablets by mouth daily. -     CBC -     CMP14+EGFR -     Lipid panel Continue all antihypertensives as prescribed.  Remember to bring in your blood pressure log with you for your follow up appointment.  DASH/Mediterranean Diets are healthier choices for HTN.     Follow Up Instructions Return in about 3 months (around 10/12/2018).     I discussed the assessment and treatment plan with the patient. The patient was provided an opportunity to ask questions and all were answered. The patient agreed with the plan and demonstrated an understanding of the instructions.   The patient was advised to call back or seek an in-person evaluation if the symptoms worsen or if the condition fails to improve as anticipated.  I provided 23 minutes of non-face-to-face time during this encounter including median intraservice time, reviewing previous notes, labs, imaging, medications and explaining diagnosis and management.  Gildardo Pounds, FNP-BC

## 2018-07-14 LAB — CMP14+EGFR
ALT: 15 [IU]/L (ref 0–32)
AST: 13 [IU]/L (ref 0–40)
Albumin/Globulin Ratio: 1.8 (ref 1.2–2.2)
Albumin: 4.5 g/dL (ref 3.8–4.8)
Alkaline Phosphatase: 113 [IU]/L (ref 39–117)
BUN/Creatinine Ratio: 19 (ref 9–23)
BUN: 15 mg/dL (ref 6–24)
Bilirubin Total: 0.3 mg/dL (ref 0.0–1.2)
CO2: 19 mmol/L — ABNORMAL LOW (ref 20–29)
Calcium: 9.6 mg/dL (ref 8.7–10.2)
Chloride: 99 mmol/L (ref 96–106)
Creatinine, Ser: 0.81 mg/dL (ref 0.57–1.00)
GFR calc Af Amer: 99 mL/min/{1.73_m2}
GFR calc non Af Amer: 86 mL/min/{1.73_m2}
Globulin, Total: 2.5 g/dL (ref 1.5–4.5)
Glucose: 145 mg/dL — ABNORMAL HIGH (ref 65–99)
Potassium: 4.3 mmol/L (ref 3.5–5.2)
Sodium: 137 mmol/L (ref 134–144)
Total Protein: 7 g/dL (ref 6.0–8.5)

## 2018-07-14 LAB — CBC
Hematocrit: 36.7 % (ref 34.0–46.6)
Hemoglobin: 11 g/dL — ABNORMAL LOW (ref 11.1–15.9)
MCH: 20.6 pg — ABNORMAL LOW (ref 26.6–33.0)
MCHC: 30 g/dL — ABNORMAL LOW (ref 31.5–35.7)
MCV: 69 fL — ABNORMAL LOW (ref 79–97)
Platelets: 537 10*3/uL — ABNORMAL HIGH (ref 150–450)
RBC: 5.33 x10E6/uL — ABNORMAL HIGH (ref 3.77–5.28)
RDW: 25.8 % — ABNORMAL HIGH (ref 11.7–15.4)
WBC: 6.6 10*3/uL (ref 3.4–10.8)

## 2018-07-14 LAB — URINALYSIS, ROUTINE W REFLEX MICROSCOPIC
Bilirubin, UA: NEGATIVE
Glucose, UA: NEGATIVE
Ketones, UA: NEGATIVE
Leukocytes,UA: NEGATIVE
Nitrite, UA: NEGATIVE
Protein,UA: NEGATIVE
RBC, UA: NEGATIVE
Specific Gravity, UA: 1.011 (ref 1.005–1.030)
Urobilinogen, Ur: 0.2 mg/dL (ref 0.2–1.0)
pH, UA: 5.5 (ref 5.0–7.5)

## 2018-07-14 LAB — LIPID PANEL
Chol/HDL Ratio: 2 ratio (ref 0.0–4.4)
Cholesterol, Total: 188 mg/dL (ref 100–199)
HDL: 93 mg/dL (ref >39)
LDL Calculated: 81 mg/dL (ref 0–99)
Triglycerides: 71 mg/dL (ref 0–149)
VLDL Cholesterol Cal: 14 mg/dL (ref 5–40)

## 2018-07-14 LAB — URINE CYTOLOGY ANCILLARY ONLY
Chlamydia: NEGATIVE
Neisseria Gonorrhea: NEGATIVE
Trichomonas: NEGATIVE

## 2018-07-16 ENCOUNTER — Other Ambulatory Visit: Payer: Self-pay | Admitting: Nurse Practitioner

## 2018-07-16 LAB — URINE CYTOLOGY ANCILLARY ONLY
Bacterial vaginitis: POSITIVE — AB
Candida vaginitis: NEGATIVE

## 2018-07-16 MED ORDER — METRONIDAZOLE 500 MG PO TABS: 500.0000 mg | ORAL_TABLET | Freq: Two times a day (BID) | ORAL | 0 refills | Status: AC

## 2018-07-16 MED ORDER — METFORMIN HCL 500 MG PO TABS: 500.0000 mg | ORAL_TABLET | Freq: Every day | ORAL | 1 refills | Status: DC

## 2018-07-16 MED ORDER — FERROUS SULFATE 325 (65 FE) MG PO TABS: 325.0000 mg | ORAL_TABLET | Freq: Every day | ORAL | 3 refills | Status: DC

## 2018-08-12 ENCOUNTER — Encounter: Payer: Self-pay | Admitting: Nurse Practitioner

## 2018-10-21 DIAGNOSIS — H524 Presbyopia: Secondary | ICD-10-CM | POA: Diagnosis not present

## 2018-12-14 ENCOUNTER — Other Ambulatory Visit: Payer: Self-pay | Admitting: Nurse Practitioner

## 2018-12-14 ENCOUNTER — Encounter: Payer: Self-pay | Admitting: Nurse Practitioner

## 2018-12-14 ENCOUNTER — Telehealth: Payer: Medicaid Other | Admitting: Physician Assistant

## 2018-12-14 DIAGNOSIS — I10 Essential (primary) hypertension: Secondary | ICD-10-CM

## 2018-12-14 DIAGNOSIS — J069 Acute upper respiratory infection, unspecified: Secondary | ICD-10-CM

## 2018-12-14 DIAGNOSIS — J4520 Mild intermittent asthma, uncomplicated: Secondary | ICD-10-CM

## 2018-12-14 MED ORDER — IPRATROPIUM-ALBUTEROL 0.5-2.5 (3) MG/3ML IN SOLN: 3.0000 mL | RESPIRATORY_TRACT | 0 refills | Status: DC | PRN

## 2018-12-14 MED ORDER — AZELASTINE HCL 0.1 % NA SOLN: 2.0000 | Freq: Two times a day (BID) | NASAL | 12 refills | Status: DC

## 2018-12-14 MED ORDER — FERROUS SULFATE 325 (65 FE) MG PO TABS: 325.0000 mg | ORAL_TABLET | Freq: Two times a day (BID) | ORAL | 0 refills | Status: DC

## 2018-12-14 MED ORDER — BREO ELLIPTA 100-25 MCG/INH IN AEPB: 1.0000 | INHALATION_SPRAY | Freq: Every day | RESPIRATORY_TRACT | 3 refills | Status: DC

## 2018-12-14 MED ORDER — AZITHROMYCIN 250 MG PO TABS: ORAL_TABLET | ORAL | 0 refills | Status: DC

## 2018-12-14 MED ORDER — BENZONATATE 100 MG PO CAPS: 100.0000 mg | ORAL_CAPSULE | Freq: Three times a day (TID) | ORAL | 0 refills | Status: DC | PRN

## 2018-12-14 MED ORDER — MONTELUKAST SODIUM 10 MG PO TABS: 10.0000 mg | ORAL_TABLET | Freq: Every day | ORAL | 3 refills | Status: DC

## 2018-12-14 NOTE — Progress Notes (Signed)
We are sorry you are not feeling well.  Here is how we plan to help!  Based on what you have shared with me, it looks like you may have a viral upper respiratory infection.  Upper respiratory infections are caused by a large number of viruses; however, rhinovirus is the most common cause.   Symptoms vary from person to person, with common symptoms including sore throat, cough, fatigue or lack of energy and feeling of general discomfort.  A low-grade fever of up to 100.4 may present, but is often uncommon.  Symptoms vary however, and are closely related to a person's age or underlying illnesses.  The most common symptoms associated with an upper respiratory infection are nasal discharge or congestion, cough, sneezing, headache and pressure in the ears and face.  These symptoms usually persist for about 3 to 10 days, but can last up to 2 weeks.  It is important to know that upper respiratory infections do not cause serious illness or complications in most cases.    Upper respiratory infections can be transmitted from person to person, with the most common method of transmission being a person's hands.  The virus is able to live on the skin and can infect other persons for up to 2 hours after direct contact.  Also, these can be transmitted when someone coughs or sneezes; thus, it is important to cover the mouth to reduce this risk.  To keep the spread of the illness at [place], good hand hygiene is very important.  This is an infection that is most likely caused by a virus. There are no specific treatments other than to help you with the symptoms until the infection runs its course.  We are sorry you are not feeling well.  Here is how we plan to help!   For nasal congestion, you may use an oral decongestants such as Mucinex D or if you have glaucoma or high blood pressure use plain Mucinex.  Saline nasal spray or nasal drops can help and can safely be used as often as needed for congestion.  For your congestion,  I have prescribed Azelastine nasal spray two sprays in each nostril twice a day  If you do not have a history of heart disease, hypertension, diabetes or thyroid disease, prostate/bladder issues or glaucoma, you may also use Sudafed to treat nasal congestion.  It is highly recommended that you consult with a pharmacist or your primary care physician to ensure this medication is safe for you to take.     If you have a cough, you may use cough suppressants such as Delsym and Robitussin.  If you have glaucoma or high blood pressure, you can also use Coricidin HBP.   For cough I have prescribed for you A prescription cough medication called Tessalon Perles 100 mg. You may take 1-2 capsules every 8 hours as needed for cough  If you have a sore or scratchy throat, use a saltwater gargle-  to  teaspoon of salt dissolved in a 4-ounce to 8-ounce glass of warm water.  Gargle the solution for approximately 15-30 seconds and then spit.  It is important not to swallow the solution.  You can also use throat lozenges/cough drops and Chloraseptic spray to help with throat pain or discomfort.  Warm or cold liquids can also be helpful in relieving throat pain.  For headache, pain or general discomfort, you can use Ibuprofen or Tylenol as directed.   Some authorities believe that zinc sprays or the use of  Echinacea may shorten the course of your symptoms.   HOME CARE . Only take medications as instructed by your medical team. . Be sure to drink plenty of fluids. Water is fine as well as fruit juices, sodas and electrolyte beverages. You may want to stay away from caffeine or alcohol. If you are nauseated, try taking small sips of liquids. How do you know if you are getting enough fluid? Your urine should be a pale yellow or almost colorless. . Get rest. . Taking a steamy shower or using a humidifier may help nasal congestion and ease sore throat pain. You can place a towel over your head and breathe in the steam  from hot water coming from a faucet. . Using a saline nasal spray works much the same way. . Cough drops, hard candies and sore throat lozenges may ease your cough. . Avoid close contacts especially the very young and the elderly . Cover your mouth if you cough or sneeze . Always remember to wash your hands.   GET HELP RIGHT AWAY IF: . You develop worsening fever. . If your symptoms do not improve within 10 days . You develop yellow or green discharge from your nose over 3 days. . You have coughing fits . You develop a severe head ache or visual changes. . You develop shortness of breath, difficulty breathing or start having chest pain . Your symptoms persist after you have completed your treatment plan  MAKE SURE YOU   Understand these instructions.  Will watch your condition.  Will get help right away if you are not doing well or get worse.  Your e-visit answers were reviewed by a board certified advanced clinical practitioner to complete your personal care plan. Depending upon the condition, your plan could have included both over the counter or prescription medications. Please review your pharmacy choice. If there is a problem, you may call our nursing hot line at and have the prescription routed to another pharmacy. Your safety is important to us. If you have drug allergies check your prescription carefully.   You can use MyChart to ask questions about today's visit, request a non-urgent call back, or ask for a work or school excuse for 24 hours related to this e-Visit. If it has been greater than 24 hours you will need to follow up with your provider, or enter a new e-Visit to address those concerns. You will get an e-mail in the next two days asking about your experience.  I hope that your e-visit has been valuable and will speed your recovery. Thank you for using e-visits.     Greater than 5 minutes, yet less than 10 minutes of time have been spent researching, coordinating  and implementing care for this patient today.   

## 2018-12-14 NOTE — Addendum Note (Signed)
Addended by: Dorise Hiss on: 12/14/2018 03:09 PM   Modules accepted: Orders

## 2018-12-15 DIAGNOSIS — I10 Essential (primary) hypertension: Secondary | ICD-10-CM | POA: Diagnosis not present

## 2018-12-15 DIAGNOSIS — E119 Type 2 diabetes mellitus without complications: Secondary | ICD-10-CM | POA: Diagnosis not present

## 2018-12-15 DIAGNOSIS — J9601 Acute respiratory failure with hypoxia: Secondary | ICD-10-CM | POA: Diagnosis not present

## 2018-12-15 DIAGNOSIS — R7989 Other specified abnormal findings of blood chemistry: Secondary | ICD-10-CM | POA: Diagnosis not present

## 2018-12-15 DIAGNOSIS — R000 Tachycardia, unspecified: Secondary | ICD-10-CM | POA: Diagnosis not present

## 2018-12-15 DIAGNOSIS — R918 Other nonspecific abnormal finding of lung field: Secondary | ICD-10-CM | POA: Diagnosis not present

## 2018-12-15 DIAGNOSIS — J4541 Moderate persistent asthma with (acute) exacerbation: Secondary | ICD-10-CM | POA: Diagnosis not present

## 2018-12-15 DIAGNOSIS — R0602 Shortness of breath: Secondary | ICD-10-CM | POA: Diagnosis not present

## 2018-12-16 DIAGNOSIS — I10 Essential (primary) hypertension: Secondary | ICD-10-CM | POA: Diagnosis not present

## 2018-12-16 DIAGNOSIS — R7989 Other specified abnormal findings of blood chemistry: Secondary | ICD-10-CM | POA: Diagnosis not present

## 2018-12-16 DIAGNOSIS — E119 Type 2 diabetes mellitus without complications: Secondary | ICD-10-CM | POA: Diagnosis not present

## 2018-12-16 DIAGNOSIS — J4541 Moderate persistent asthma with (acute) exacerbation: Secondary | ICD-10-CM | POA: Diagnosis not present

## 2018-12-17 DIAGNOSIS — R7989 Other specified abnormal findings of blood chemistry: Secondary | ICD-10-CM | POA: Diagnosis not present

## 2018-12-17 DIAGNOSIS — I10 Essential (primary) hypertension: Secondary | ICD-10-CM | POA: Diagnosis not present

## 2018-12-17 DIAGNOSIS — J4541 Moderate persistent asthma with (acute) exacerbation: Secondary | ICD-10-CM | POA: Diagnosis not present

## 2018-12-17 DIAGNOSIS — E119 Type 2 diabetes mellitus without complications: Secondary | ICD-10-CM | POA: Diagnosis not present

## 2018-12-19 ENCOUNTER — Other Ambulatory Visit: Payer: Self-pay | Admitting: Nurse Practitioner

## 2018-12-19 DIAGNOSIS — G4709 Other insomnia: Secondary | ICD-10-CM

## 2018-12-21 DIAGNOSIS — D500 Iron deficiency anemia secondary to blood loss (chronic): Secondary | ICD-10-CM | POA: Diagnosis not present

## 2018-12-21 DIAGNOSIS — N920 Excessive and frequent menstruation with regular cycle: Secondary | ICD-10-CM | POA: Diagnosis not present

## 2018-12-21 DIAGNOSIS — D473 Essential (hemorrhagic) thrombocythemia: Secondary | ICD-10-CM | POA: Diagnosis not present

## 2018-12-28 DIAGNOSIS — N920 Excessive and frequent menstruation with regular cycle: Secondary | ICD-10-CM | POA: Diagnosis not present

## 2018-12-28 DIAGNOSIS — D473 Essential (hemorrhagic) thrombocythemia: Secondary | ICD-10-CM | POA: Diagnosis not present

## 2018-12-28 DIAGNOSIS — D500 Iron deficiency anemia secondary to blood loss (chronic): Secondary | ICD-10-CM | POA: Diagnosis not present

## 2019-01-04 ENCOUNTER — Other Ambulatory Visit: Payer: Self-pay | Admitting: Nurse Practitioner

## 2019-01-04 DIAGNOSIS — N920 Excessive and frequent menstruation with regular cycle: Secondary | ICD-10-CM | POA: Diagnosis not present

## 2019-01-04 DIAGNOSIS — I10 Essential (primary) hypertension: Secondary | ICD-10-CM

## 2019-01-04 DIAGNOSIS — D500 Iron deficiency anemia secondary to blood loss (chronic): Secondary | ICD-10-CM | POA: Diagnosis not present

## 2019-01-04 DIAGNOSIS — D473 Essential (hemorrhagic) thrombocythemia: Secondary | ICD-10-CM | POA: Diagnosis not present

## 2019-01-10 ENCOUNTER — Ambulatory Visit: Payer: Medicaid Other | Attending: Nurse Practitioner | Admitting: Nurse Practitioner

## 2019-01-10 ENCOUNTER — Encounter: Payer: Self-pay | Admitting: Nurse Practitioner

## 2019-01-10 ENCOUNTER — Other Ambulatory Visit: Payer: Self-pay

## 2019-01-10 VITALS — BP 103/71 | HR 105 | Temp 98.3°F | Ht 73.0 in | Wt 303.0 lb

## 2019-01-10 DIAGNOSIS — Z79899 Other long term (current) drug therapy: Secondary | ICD-10-CM | POA: Insufficient documentation

## 2019-01-10 DIAGNOSIS — R229 Localized swelling, mass and lump, unspecified: Secondary | ICD-10-CM

## 2019-01-10 DIAGNOSIS — R7303 Prediabetes: Secondary | ICD-10-CM | POA: Diagnosis not present

## 2019-01-10 DIAGNOSIS — F419 Anxiety disorder, unspecified: Secondary | ICD-10-CM

## 2019-01-10 DIAGNOSIS — J4551 Severe persistent asthma with (acute) exacerbation: Secondary | ICD-10-CM | POA: Diagnosis not present

## 2019-01-10 DIAGNOSIS — R05 Cough: Secondary | ICD-10-CM | POA: Diagnosis not present

## 2019-01-10 DIAGNOSIS — R222 Localized swelling, mass and lump, trunk: Secondary | ICD-10-CM | POA: Insufficient documentation

## 2019-01-10 DIAGNOSIS — Z7984 Long term (current) use of oral hypoglycemic drugs: Secondary | ICD-10-CM | POA: Diagnosis not present

## 2019-01-10 DIAGNOSIS — Z6841 Body Mass Index (BMI) 40.0 and over, adult: Secondary | ICD-10-CM | POA: Diagnosis not present

## 2019-01-10 DIAGNOSIS — I10 Essential (primary) hypertension: Secondary | ICD-10-CM

## 2019-01-10 DIAGNOSIS — G4733 Obstructive sleep apnea (adult) (pediatric): Secondary | ICD-10-CM | POA: Diagnosis not present

## 2019-01-10 DIAGNOSIS — R2232 Localized swelling, mass and lump, left upper limb: Secondary | ICD-10-CM | POA: Diagnosis not present

## 2019-01-10 LAB — GLUCOSE, POCT (MANUAL RESULT ENTRY): POC Glucose: 149 mg/dl — AB (ref 70–99)

## 2019-01-10 LAB — POCT GLYCOSYLATED HEMOGLOBIN (HGB A1C): Hemoglobin A1C: 6.4 % — AB (ref 4.0–5.6)

## 2019-01-10 MED ORDER — AMLODIPINE BESYLATE 10 MG PO TABS: 10.0000 mg | ORAL_TABLET | Freq: Every day | ORAL | 2 refills | Status: DC

## 2019-01-10 MED ORDER — BUSPIRONE HCL 15 MG PO TABS: ORAL_TABLET | ORAL | 3 refills | Status: DC

## 2019-01-10 MED ORDER — HYDROCODONE-HOMATROPINE 5-1.5 MG/5ML PO SYRP: 5.0000 mL | ORAL_SOLUTION | Freq: Four times a day (QID) | ORAL | 0 refills | Status: DC | PRN

## 2019-01-10 MED ORDER — CETIRIZINE HCL 10 MG PO TABS: ORAL_TABLET | ORAL | 2 refills | Status: DC

## 2019-01-10 NOTE — Progress Notes (Signed)
Assessment & Plan:  Tonya Wilkerson was seen today for hospitalization follow-up.  Diagnoses and all orders for this visit:  Severe persistent asthma with acute exacerbation -     Ambulatory referral to Pulmonology -     cetirizine (ZYRTEC) 10 MG tablet; TAKE 1 TABLET(10 MG) BY MOUTH DAILY -     HYDROcodone-homatropine (HYCODAN) 5-1.5 MG/5ML syrup; Take 5 mLs by mouth every 6 (six) hours as needed for cough.  Mass of left axilla -     Ambulatory referral to Dermatology  Essential hypertension -     amLODipine (NORVASC) 10 MG tablet; Take 1 tablet (10 mg total) by mouth daily. Continue all antihypertensives as prescribed.  Remember to bring in your blood pressure log with you for your follow up appointment.  DASH/Mediterranean Diets are healthier choices for HTN.    Prediabetes -     Glucose (CBG) -     HgB A1c  Anxiety -     busPIRone (BUSPAR) 15 MG tablet; TAKE 1 TABLET(15 MG) BY MOUTH THREE TIMES DAILY  Morbid obesity with BMI of 40.0-44.9, adult (HCC) -     Amb Ref to Medical Weight Management    Patient has been counseled on age-appropriate routine health concerns for screening and prevention. These are reviewed and up-to-date. Referrals have been placed accordingly. Immunizations are up-to-dat-e or declined.    Subjective:   Chief Complaint  Patient presents with  . Hospitalization Follow-up    Pt. is here for HFU.    [name] 48 y.o. female presents to office today for HFU.  She has poorly controlled asthma symptoms despite her endorsement of  medication compliance. Recent hospital admission on 12-15-2018 with acute resp failure and hypoxia related to asthma exacerbation. She had PNA and another admission for asthma exacerbation in March of this year. Current medications include Brea 100-25 1 puff dialy, Duonebs prn, singulair nightly, Cetirizine daily, and azelastine nasal spray. Today she continues with shortness of breath and cough even after recently completing  a zpack and prednisone taper. She also has OSA and is not consistently wearing her CPAP. I will refer to Pulmonology for further recommendations.   Essential Hypertension Well controlled. Taking amlodipine 10 mg daily, lisinopril-hctz 20-25 mg 2 tablets daily. Denies chest pain, shortness of breath, palpitations, lightheadedness, dizziness, headaches or BLE edema. She is not diet or exercise compliant.  BP Readings from Last 3 Encounters:  01/10/19 103/71  06/01/18 131/90  03/02/18 (!) 141/90    Prediabetes Controlled with metformin 500 mg daily. She denies any hypo or hyperglycemic symptoms. Does not check her blood glucose levels daily.  Lab Results  Component Value Date   HGBA1C 6.4 (A) 01/10/2019   Left Axillary Nodule She has a non tender 0.25cm sized nodule in the left axilla. She states this is not related to any shaving of her underarm hair and the nodule has changed in regard to size.   Review of Systems  Constitutional: Positive for malaise/fatigue. Negative for fever and weight loss.  HENT: Negative.  Negative for nosebleeds.   Eyes: Negative.  Negative for blurred vision, double vision and photophobia.  Respiratory: Positive for cough and shortness of breath. Negative for hemoptysis, sputum production and wheezing.   Cardiovascular: Negative.  Negative for chest pain, palpitations and leg swelling.  Gastrointestinal: Negative.  Negative for heartburn, nausea and vomiting.  Musculoskeletal: Negative.  Negative for myalgias.  Skin:       See hpi  Neurological: Negative.  Negative for dizziness, focal  weakness, seizures and headaches.  Endo/Heme/Allergies: Positive for environmental allergies.  Psychiatric/Behavioral: Negative for suicidal ideas. The patient is nervous/anxious.     Past Medical History:  Diagnosis Date  . Anemia   . Asthma   . Hematuria   . Hyperbilirubinemia   . Hypertension   . Morbid obesity with BMI of 40.0-44.9, adult ([place])   . Pneumonia due  to infectious organism     Past Surgical History:  Procedure Laterality Date  . TUBAL LIGATION      Family History  Problem Relation Age of Onset  . Hypertension Mother   . Stroke Mother   . Diabetes Maternal Aunt   . Cancer Maternal Grandfather     Social History Reviewed with no changes to be made today.   Outpatient Medications Prior to Visit  Medication Sig Dispense Refill  . Accu-Chek FastClix Lancets MISC Use as instructed. Inject into the skin twice daily 100 each 3  . azelastine (ASTELIN) 0.1 % nasal spray Place 2 sprays into both nostrils 2 (two) times daily. Use in each nostril as directed 30 mL 12  . Blood Glucose Monitoring Suppl (ACCU-CHEK GUIDE) w/Device KIT 1 each by Does not apply route 2 (two) times daily. 1 kit 0  . cyclobenzaprine (FLEXERIL) 5 MG tablet Take 1 tablet (5 mg total) by mouth 3 (three) times daily as needed for muscle spasms. 30 tablet 1  . ferrous sulfate 325 (65 FE) MG tablet Take 1 tablet (325 mg total) by mouth 2 (two) times daily with a meal. 180 tablet 0  . fluticasone furoate-vilanterol (BREO ELLIPTA) 100-25 MCG/INH AEPB Inhale 1 puff into the lungs daily. 60 each 3  . glucose blood (ACCU-CHEK GUIDE) test strip Use as instructed bid 100 each 12  . ipratropium-albuterol (DUONEB) 0.5-2.5 (3) MG/3ML SOLN Take 3 mLs by nebulization every 4 (four) hours as needed. 360 mL 0  . lisinopril-hydrochlorothiazide (ZESTORETIC) 20-25 MG tablet TAKE 2 TABLETS BY MOUTH DAILY 60 tablet 0  . metFORMIN (GLUCOPHAGE) 500 MG tablet Take 1 tablet (500 mg total) by mouth daily with breakfast. 90 tablet 1  . montelukast (SINGULAIR) 10 MG tablet Take 1 tablet (10 mg total) by mouth at bedtime. 30 tablet 3  . traZODone (DESYREL) 150 MG tablet TAKE 1 TABLET(150 MG) BY MOUTH AT BEDTIME 30 tablet 0  . amLODipine (NORVASC) 10 MG tablet Take 10 mg by mouth daily.    Marland Kitchen azithromycin (ZITHROMAX) 250 MG tablet Take 2 (two) tablets by mouth on day one. Take 1 (one) tablet by mouth  on days 2-5. 6 tablet 0  . benzonatate (TESSALON) 100 MG capsule Take 1-2 capsules (100-200 mg total) by mouth 3 (three) times daily as needed for cough. 40 capsule 0  . busPIRone (BUSPAR) 15 MG tablet TAKE 1 TABLET(15 MG) BY MOUTH THREE TIMES DAILY 90 tablet 0  . cetirizine (ZYRTEC) 10 MG tablet TAKE 1 TABLET(10 MG) BY MOUTH DAILY 90 tablet 0  . fluticasone (FLONASE) 50 MCG/ACT nasal spray Place 1 spray into both nostrils daily for 30 days. 16 g 2   No facility-administered medications prior to visit.     No Known Allergies     Objective:    BP 103/71 (BP Location: Left Arm, Patient Position: Sitting, Cuff Size: Large)   Pulse (!) 105   Temp 98.3 F (36.8 C) (Oral)   Ht '6\' 1"'  (1.854 m)   Wt (!) 303 lb (137.4 kg)   LMP  (LMP Unknown)   SpO2 95%  BMI 39.98 kg/m  Wt Readings from Last 3 Encounters:  01/10/19 (!) 303 lb (137.4 kg)  06/01/18 296 lb 3.2 oz (134.4 kg)  03/02/18 297 lb 9.6 oz (135 kg)    Physical Exam Vitals signs and nursing note reviewed.  Constitutional:      Appearance: She is well-developed.  HENT:     Head: Normocephalic and atraumatic.  Neck:     Musculoskeletal: Normal range of motion.  Cardiovascular:     Rate and Rhythm: Normal rate and regular rhythm.     Heart sounds: Normal heart sounds. No murmur. No friction rub. No gallop.   Pulmonary:     Effort: Prolonged expiration present. No tachypnea, accessory muscle usage, respiratory distress or retractions.     Breath sounds: Normal breath sounds. Decreased air movement present. No decreased breath sounds, wheezing, rhonchi or rales.  Chest:     Chest wall: No tenderness.    Abdominal:     General: Bowel sounds are normal.     Palpations: Abdomen is soft.  Musculoskeletal: Normal range of motion.  Lymphadenopathy:     Upper Body:     Left upper body: Axillary adenopathy present.  Skin:    General: Skin is warm and dry.  Neurological:     Mental Status: She is alert and oriented to person,  place, and time.     Coordination: Coordination normal.  Psychiatric:        Behavior: Behavior normal. Behavior is cooperative.        Thought Content: Thought content normal.        Judgment: Judgment normal.         Patient has been counseled extensively about nutrition and exercise as well as the importance of adherence with medications and regular follow-up. The patient was given clear instructions to go to ER or return to medical center if symptoms don't improve, worsen or new problems develop. The patient verbalized understanding.   Follow-up: Return in about 3 months (around 04/12/2019).   Gildardo Pounds, FNP-BC [facility] and [facility] [place], [place]   01/17/2019, 9:25 AM

## 2019-01-13 ENCOUNTER — Encounter: Payer: Self-pay | Admitting: Nurse Practitioner

## 2019-01-14 DIAGNOSIS — Z1231 Encounter for screening mammogram for malignant neoplasm of breast: Secondary | ICD-10-CM | POA: Diagnosis not present

## 2019-01-17 ENCOUNTER — Encounter: Payer: Self-pay | Admitting: Nurse Practitioner

## 2019-01-21 DIAGNOSIS — Z7189 Other specified counseling: Secondary | ICD-10-CM | POA: Diagnosis not present

## 2019-01-21 DIAGNOSIS — L720 Epidermal cyst: Secondary | ICD-10-CM | POA: Diagnosis not present

## 2019-02-23 DIAGNOSIS — D500 Iron deficiency anemia secondary to blood loss (chronic): Secondary | ICD-10-CM | POA: Diagnosis not present

## 2019-02-23 DIAGNOSIS — I10 Essential (primary) hypertension: Secondary | ICD-10-CM | POA: Diagnosis not present

## 2019-02-23 DIAGNOSIS — E119 Type 2 diabetes mellitus without complications: Secondary | ICD-10-CM | POA: Diagnosis not present

## 2019-02-23 DIAGNOSIS — Z6841 Body Mass Index (BMI) 40.0 and over, adult: Secondary | ICD-10-CM | POA: Diagnosis not present

## 2019-03-03 ENCOUNTER — Encounter: Payer: Self-pay | Admitting: Nurse Practitioner

## 2019-03-03 DIAGNOSIS — Z03818 Encounter for observation for suspected exposure to other biological agents ruled out: Secondary | ICD-10-CM | POA: Diagnosis not present

## 2019-03-03 NOTE — Telephone Encounter (Signed)
Please advise if patient needs additional tele visit for concern or alternative in medication or plan.

## 2019-03-06 ENCOUNTER — Other Ambulatory Visit: Payer: Self-pay | Admitting: Nurse Practitioner

## 2019-03-06 DIAGNOSIS — J4551 Severe persistent asthma with (acute) exacerbation: Secondary | ICD-10-CM

## 2019-03-06 MED ORDER — PREDNISONE 20 MG PO TABS: 40.0000 mg | ORAL_TABLET | Freq: Every day | ORAL | 0 refills | Status: AC

## 2019-03-06 MED ORDER — PSEUDOEPH-BROMPHEN-DM 30-2-10 MG/5ML PO SYRP: 5.0000 mL | ORAL_SOLUTION | Freq: Four times a day (QID) | ORAL | 1 refills | Status: AC | PRN

## 2019-03-15 ENCOUNTER — Institutional Professional Consult (permissible substitution): Payer: Medicaid Other | Admitting: Internal Medicine

## 2019-03-23 DIAGNOSIS — Z7951 Long term (current) use of inhaled steroids: Secondary | ICD-10-CM | POA: Diagnosis not present

## 2019-03-23 DIAGNOSIS — D473 Essential (hemorrhagic) thrombocythemia: Secondary | ICD-10-CM | POA: Diagnosis not present

## 2019-03-23 DIAGNOSIS — Z7984 Long term (current) use of oral hypoglycemic drugs: Secondary | ICD-10-CM | POA: Diagnosis not present

## 2019-03-23 DIAGNOSIS — J45909 Unspecified asthma, uncomplicated: Secondary | ICD-10-CM | POA: Diagnosis not present

## 2019-03-23 DIAGNOSIS — I10 Essential (primary) hypertension: Secondary | ICD-10-CM | POA: Diagnosis not present

## 2019-03-23 DIAGNOSIS — N921 Excessive and frequent menstruation with irregular cycle: Secondary | ICD-10-CM | POA: Diagnosis not present

## 2019-03-23 DIAGNOSIS — Z79899 Other long term (current) drug therapy: Secondary | ICD-10-CM | POA: Diagnosis not present

## 2019-03-23 DIAGNOSIS — Z87891 Personal history of nicotine dependence: Secondary | ICD-10-CM | POA: Diagnosis not present

## 2019-03-23 DIAGNOSIS — D500 Iron deficiency anemia secondary to blood loss (chronic): Secondary | ICD-10-CM | POA: Diagnosis not present

## 2019-04-12 ENCOUNTER — Ambulatory Visit: Payer: Medicaid Other | Attending: Nurse Practitioner | Admitting: Nurse Practitioner

## 2019-04-12 ENCOUNTER — Encounter: Payer: Self-pay | Admitting: Nurse Practitioner

## 2019-04-13 NOTE — Telephone Encounter (Signed)
Please reschedule patient when able, she is aware of the notification coming via mychart.

## 2019-04-26 ENCOUNTER — Ambulatory Visit: Payer: Medicaid Other | Attending: Nurse Practitioner | Admitting: Nurse Practitioner

## 2019-04-26 DIAGNOSIS — N939 Abnormal uterine and vaginal bleeding, unspecified: Secondary | ICD-10-CM | POA: Insufficient documentation

## 2019-04-26 DIAGNOSIS — Z7901 Long term (current) use of anticoagulants: Secondary | ICD-10-CM | POA: Diagnosis not present

## 2019-04-26 DIAGNOSIS — Z79899 Other long term (current) drug therapy: Secondary | ICD-10-CM | POA: Insufficient documentation

## 2019-04-26 DIAGNOSIS — Z6841 Body Mass Index (BMI) 40.0 and over, adult: Secondary | ICD-10-CM | POA: Insufficient documentation

## 2019-04-26 DIAGNOSIS — D649 Anemia, unspecified: Secondary | ICD-10-CM | POA: Diagnosis not present

## 2019-04-26 DIAGNOSIS — Z794 Long term (current) use of insulin: Secondary | ICD-10-CM | POA: Insufficient documentation

## 2019-04-26 DIAGNOSIS — G4709 Other insomnia: Secondary | ICD-10-CM | POA: Insufficient documentation

## 2019-04-26 DIAGNOSIS — D500 Iron deficiency anemia secondary to blood loss (chronic): Secondary | ICD-10-CM

## 2019-04-26 DIAGNOSIS — J4520 Mild intermittent asthma, uncomplicated: Secondary | ICD-10-CM | POA: Diagnosis not present

## 2019-04-26 DIAGNOSIS — I10 Essential (primary) hypertension: Secondary | ICD-10-CM

## 2019-04-26 DIAGNOSIS — E119 Type 2 diabetes mellitus without complications: Secondary | ICD-10-CM | POA: Diagnosis not present

## 2019-04-26 NOTE — Progress Notes (Signed)
Assessment & Plan:  Diagnoses and all orders for this visit:  Abnormal uterine bleeding (AUB) -     Ambulatory referral to Gynecology  Iron deficiency anemia due to chronic blood loss -     Ambulatory referral to Gynecology -     ferrous sulfate 325 (65 FE) MG tablet; Take 1 tablet (325 mg total) by mouth 2 (two) times daily with a meal.  Mild intermittent asthma without complication -     fluticasone furoate-vilanterol (BREO ELLIPTA) 100-25 MCG/INH AEPB; Inhale 1 puff into the lungs daily. -     montelukast (SINGULAIR) 10 MG tablet; Take 1 tablet (10 mg total) by mouth at bedtime.  Essential hypertension -     lisinopril-hydrochlorothiazide (ZESTORETIC) 20-25 MG tablet; Take 2 tablets by mouth daily. -     amLODipine (NORVASC) 10 MG tablet; Take 1 tablet (10 mg total) by mouth daily.  Other insomnia -     traZODone (DESYREL) 150 MG tablet; TAKE 1 TABLET(150 MG) BY MOUTH AT BEDTIME  Controlled type 2 diabetes mellitus without complication, without long-term current use of insulin (HCC) -     metFORMIN (GLUCOPHAGE) 500 MG tablet; Take 1 tablet (500 mg total) by mouth daily with breakfast. -     glucose blood (ACCU-CHEK GUIDE) test strip; Use as instructed. Inject into the skin twice daily E11.65 -     Accu-Chek FastClix Lancets MISC; Use as instructed. Inject into the skin twice daily E11.65  Other orders -     atorvastatin (LIPITOR) 20 MG tablet; Take 1 tablet (20 mg total) by mouth daily.    Patient has been counseled on age-appropriate routine health concerns for screening and prevention. These are reviewed and up-to-date. Referrals have been placed accordingly. Immunizations are up-to-date or declined.    Subjective:  [name] 49 y.o. female presents to office today via web ex video.  Doing well. Denies any recent asthma flares.  Seeing hematology for chronic blood loss anemia. Referred to Gynecology today for chronic menorrhagia. Currently prescribed iron tablets  for anemia (ferrous sulfate 325 mg BID).    Prescribed 500 mg daily. Does not endorse any hypo or hyperglycemic symptoms. Will prescribe atorvastatin 20 mg due per ADA guidelines for diabetes.  Lab Results  Component Value Date   HGBA1C 6.4 (A) 01/10/2019   Lab Results  Component Value Date   LDLCALC 81 07/13/2018   Review of Systems  Constitutional: Negative for fever, malaise/fatigue and weight loss.  HENT: Negative.  Negative for nosebleeds.   Eyes: Negative.  Negative for blurred vision, double vision and photophobia.  Respiratory: Negative.  Negative for cough, shortness of breath and wheezing.   Cardiovascular: Negative.  Negative for chest pain, palpitations and leg swelling.  Gastrointestinal: Negative.  Negative for heartburn, nausea and vomiting.  Genitourinary:       SEE HPI  Musculoskeletal: Negative.  Negative for myalgias.  Neurological: Negative.  Negative for dizziness, focal weakness, seizures and headaches.  Endo/Heme/Allergies: Positive for environmental allergies.  Psychiatric/Behavioral: Negative.  Negative for suicidal ideas.    Past Medical History:  Diagnosis Date  . Anemia   . Asthma   . Hematuria   . Hyperbilirubinemia   . Hypertension   . Morbid obesity with BMI of 40.0-44.9, adult ([place])   . Pneumonia due to infectious organism     Past Surgical History:  Procedure Laterality Date  . TUBAL LIGATION      Family History  Problem Relation Age of Onset  . Hypertension  Mother   . Stroke Mother   . Diabetes Maternal Aunt   . Cancer Maternal Grandfather     Social History Reviewed with no changes to be made today.   Outpatient Medications Prior to Visit  Medication Sig Dispense Refill  . azelastine (ASTELIN) 0.1 % nasal spray Place 2 sprays into both nostrils 2 (two) times daily. Use in each nostril as directed 30 mL 12  . Blood Glucose Monitoring Suppl (ACCU-CHEK GUIDE) w/Device KIT 1 each by Does not apply route 2 (two) times daily. 1 kit  0  . busPIRone (BUSPAR) 15 MG tablet TAKE 1 TABLET(15 MG) BY MOUTH THREE TIMES DAILY 90 tablet 3  . cetirizine (ZYRTEC) 10 MG tablet TAKE 1 TABLET(10 MG) BY MOUTH DAILY 90 tablet 2  . cyclobenzaprine (FLEXERIL) 5 MG tablet Take 1 tablet (5 mg total) by mouth 3 (three) times daily as needed for muscle spasms. 30 tablet 1  . fluticasone (FLONASE) 50 MCG/ACT nasal spray Place 1 spray into both nostrils daily for 30 days. 16 g 2  . HYDROcodone-homatropine (HYCODAN) 5-1.5 MG/5ML syrup Take 5 mLs by mouth every 6 (six) hours as needed for cough. 473 mL 0  . ipratropium-albuterol (DUONEB) 0.5-2.5 (3) MG/3ML SOLN Take 3 mLs by nebulization every 4 (four) hours as needed. 360 mL 0  . Accu-Chek FastClix Lancets MISC Use as instructed. Inject into the skin twice daily 100 each 3  . amLODipine (NORVASC) 10 MG tablet Take 1 tablet (10 mg total) by mouth daily. 90 tablet 2  . ferrous sulfate 325 (65 FE) MG tablet Take 1 tablet (325 mg total) by mouth 2 (two) times daily with a meal. 180 tablet 0  . fluticasone furoate-vilanterol (BREO ELLIPTA) 100-25 MCG/INH AEPB Inhale 1 puff into the lungs daily. 60 each 3  . glucose blood (ACCU-CHEK GUIDE) test strip Use as instructed bid 100 each 12  . lisinopril-hydrochlorothiazide (ZESTORETIC) 20-25 MG tablet TAKE 2 TABLETS BY MOUTH DAILY 60 tablet 0  . metFORMIN (GLUCOPHAGE) 500 MG tablet Take 1 tablet (500 mg total) by mouth daily with breakfast. 90 tablet 1  . montelukast (SINGULAIR) 10 MG tablet Take 1 tablet (10 mg total) by mouth at bedtime. 30 tablet 3  . traZODone (DESYREL) 150 MG tablet TAKE 1 TABLET(150 MG) BY MOUTH AT BEDTIME 30 tablet 0   No facility-administered medications prior to visit.    No Known Allergies     Objective:    Wt Readings from Last 3 Encounters:  01/10/19 (!) 303 lb (137.4 kg)  06/01/18 296 lb 3.2 oz (134.4 kg)  03/02/18 297 lb 9.6 oz (135 kg)    Physical Exam Constitutional:      Appearance: Normal appearance.  HENT:      Head: Normocephalic.  Pulmonary:     Effort: Pulmonary effort is normal.  Musculoskeletal:     Cervical back: Normal range of motion.  Neurological:     Mental Status: She is alert and oriented to person, place, and time.          Patient has been counseled extensively about nutrition and exercise as well as the importance of adherence with medications and regular follow-up. The patient was given clear instructions to go to ER or return to medical center if symptoms don't improve, worsen or new problems develop. The patient verbalized understanding.   Follow-up: Return in about 4 weeks (around 05/24/2019).   Gildardo Pounds, FNP-BC [facility] and [facility] [place], [place]

## 2019-04-28 DIAGNOSIS — E119 Type 2 diabetes mellitus without complications: Secondary | ICD-10-CM | POA: Diagnosis not present

## 2019-04-28 DIAGNOSIS — Z6841 Body Mass Index (BMI) 40.0 and over, adult: Secondary | ICD-10-CM | POA: Diagnosis not present

## 2019-04-28 DIAGNOSIS — D500 Iron deficiency anemia secondary to blood loss (chronic): Secondary | ICD-10-CM | POA: Diagnosis not present

## 2019-05-07 ENCOUNTER — Encounter: Payer: Self-pay | Admitting: Nurse Practitioner

## 2019-05-07 MED ORDER — MONTELUKAST SODIUM 10 MG PO TABS: 10.0000 mg | ORAL_TABLET | Freq: Every day | ORAL | 3 refills | Status: DC

## 2019-05-07 MED ORDER — ATORVASTATIN CALCIUM 20 MG PO TABS: 20.0000 mg | ORAL_TABLET | Freq: Every day | ORAL | 3 refills | Status: DC

## 2019-05-07 MED ORDER — AMLODIPINE BESYLATE 10 MG PO TABS: 10.0000 mg | ORAL_TABLET | Freq: Every day | ORAL | 2 refills | Status: DC

## 2019-05-07 MED ORDER — ACCU-CHEK GUIDE VI STRP: ORAL_STRIP | 12 refills | Status: AC

## 2019-05-07 MED ORDER — BREO ELLIPTA 100-25 MCG/INH IN AEPB: 1.0000 | INHALATION_SPRAY | Freq: Every day | RESPIRATORY_TRACT | 3 refills | Status: DC

## 2019-05-07 MED ORDER — LISINOPRIL-HYDROCHLOROTHIAZIDE 20-25 MG PO TABS: 2.0000 | ORAL_TABLET | Freq: Every day | ORAL | 3 refills | Status: DC

## 2019-05-07 MED ORDER — TRAZODONE HCL 150 MG PO TABS: ORAL_TABLET | ORAL | 1 refills | Status: DC

## 2019-05-07 MED ORDER — ACCU-CHEK FASTCLIX LANCETS MISC: 6 refills | Status: DC

## 2019-05-07 MED ORDER — FERROUS SULFATE 325 (65 FE) MG PO TABS: 325.0000 mg | ORAL_TABLET | Freq: Two times a day (BID) | ORAL | 0 refills | Status: DC

## 2019-05-07 MED ORDER — METFORMIN HCL 500 MG PO TABS: 500.0000 mg | ORAL_TABLET | Freq: Every day | ORAL | 1 refills | Status: DC

## 2019-05-17 ENCOUNTER — Encounter: Payer: Self-pay | Admitting: Nurse Practitioner

## 2019-05-17 ENCOUNTER — Other Ambulatory Visit: Payer: Self-pay

## 2019-05-17 ENCOUNTER — Ambulatory Visit: Payer: Medicaid Other | Attending: Nurse Practitioner | Admitting: Nurse Practitioner

## 2019-05-17 ENCOUNTER — Other Ambulatory Visit (HOSPITAL_COMMUNITY)
Admission: RE | Admit: 2019-05-17 | Discharge: 2019-05-17 | Disposition: A | Discharge status: Home / Self Care | Payer: Medicaid Other | Source: Ambulatory Visit | Attending: Nurse Practitioner | Admitting: Nurse Practitioner

## 2019-05-17 VITALS — BP 137/90 | HR 96 | Temp 97.5°F | Ht 73.0 in | Wt 320.0 lb

## 2019-05-17 DIAGNOSIS — I10 Essential (primary) hypertension: Secondary | ICD-10-CM

## 2019-05-17 DIAGNOSIS — Z124 Encounter for screening for malignant neoplasm of cervix: Secondary | ICD-10-CM | POA: Diagnosis not present

## 2019-05-17 DIAGNOSIS — Z794 Long term (current) use of insulin: Secondary | ICD-10-CM | POA: Diagnosis not present

## 2019-05-17 DIAGNOSIS — E119 Type 2 diabetes mellitus without complications: Secondary | ICD-10-CM | POA: Diagnosis not present

## 2019-05-17 DIAGNOSIS — E669 Obesity, unspecified: Secondary | ICD-10-CM | POA: Diagnosis not present

## 2019-05-17 DIAGNOSIS — Z6841 Body Mass Index (BMI) 40.0 and over, adult: Secondary | ICD-10-CM

## 2019-05-17 DIAGNOSIS — D500 Iron deficiency anemia secondary to blood loss (chronic): Secondary | ICD-10-CM

## 2019-05-17 LAB — GLUCOSE, POCT (MANUAL RESULT ENTRY): POC Glucose: 174 mg/dl — AB (ref 70–99)

## 2019-05-17 NOTE — Progress Notes (Signed)
Assessment & Plan:  Tonya Wilkerson was seen today for gynecologic exam.  Diagnoses and all orders for this visit:  Encounter for Papanicolaou smear for cervical cancer screening -     Cytology - PAP -     Cervicovaginal ancillary only  Controlled type 2 diabetes mellitus without complication, without long-term current use of insulin (HCC) -     Glucose (CBG) -     Lipid panel -     Hemoglobin A1c Continue blood sugar control as discussed in office today, low carbohydrate diet, and regular physical exercise as tolerated, 150 minutes per week (30 min each day, 5 days per week, or 50 min 3 days per week). Keep blood sugar logs with fasting goal of 90-130 mg/dl, post prandial (after you eat) less than 180.  For Hypoglycemia: BS <60 and Hyperglycemia BS >400; contact the clinic ASAP. Annual eye exams and foot exams are recommended.   Essential hypertension -     CMP14+EGFR Continue all antihypertensives as prescribed.  Remember to bring in your blood pressure log with you for your follow up appointment.  DASH/Mediterranean Diets are healthier choices for HTN.    Iron deficiency anemia due to chronic blood loss -     CBC    Patient has been counseled on age-appropriate routine health concerns for screening and prevention. These are reviewed and up-to-date. Referrals have been placed accordingly. Immunizations are up-to-date or declined.    Subjective:   Chief Complaint  Patient presents with  . Gynecologic Exam    Pt. is here for a pap smear.    [name] 49 y.o. female presents to office today for PAP. She is being followed by the bariatric center. She was prescribed Topamax 50 mg however wanted to speak with me first prior to taking it. I did recommend she start taking it and if she develops any symptoms such as vomiting, chest pain, shortness of breath, diarrhea or acute vision changes to stop the medication and notify the prescriber.   Review of Systems  Constitutional:  Negative.  Negative for chills, fever, malaise/fatigue and weight loss.  Respiratory: Negative.  Negative for cough, shortness of breath and wheezing.   Cardiovascular: Negative.  Negative for chest pain, orthopnea and leg swelling.  Gastrointestinal: Negative for abdominal pain.  Genitourinary: Negative.  Negative for flank pain.  Skin: Negative.  Negative for rash.  Psychiatric/Behavioral: Negative for suicidal ideas.    Past Medical History:  Diagnosis Date  . Anemia   . Asthma   . Hematuria   . Hyperbilirubinemia   . Hypertension   . Morbid obesity with BMI of 40.0-44.9, adult ([place])   . Pneumonia due to infectious organism     Past Surgical History:  Procedure Laterality Date  . TUBAL LIGATION      Family History  Problem Relation Age of Onset  . Hypertension Mother   . Stroke Mother   . Diabetes Maternal Aunt   . Cancer Maternal Grandfather     Social History Reviewed with no changes to be made today.   Outpatient Medications Prior to Visit  Medication Sig Dispense Refill  . Accu-Chek FastClix Lancets MISC Use as instructed. Inject into the skin twice daily E11.65 200 each 6  . amLODipine (NORVASC) 10 MG tablet Take 1 tablet (10 mg total) by mouth daily. 90 tablet 2  . atorvastatin (LIPITOR) 20 MG tablet Take 1 tablet (20 mg total) by mouth daily. 90 tablet 3  . azelastine (ASTELIN) 0.1 % nasal  spray Place 2 sprays into both nostrils 2 (two) times daily. Use in each nostril as directed 30 mL 12  . Blood Glucose Monitoring Suppl (ACCU-CHEK GUIDE) w/Device KIT 1 each by Does not apply route 2 (two) times daily. 1 kit 0  . busPIRone (BUSPAR) 15 MG tablet TAKE 1 TABLET(15 MG) BY MOUTH THREE TIMES DAILY 90 tablet 3  . cetirizine (ZYRTEC) 10 MG tablet TAKE 1 TABLET(10 MG) BY MOUTH DAILY 90 tablet 2  . cyclobenzaprine (FLEXERIL) 5 MG tablet Take 1 tablet (5 mg total) by mouth 3 (three) times daily as needed for muscle spasms. 30 tablet 1  . ferrous sulfate 325 (65 FE) MG  tablet Take 1 tablet (325 mg total) by mouth 2 (two) times daily with a meal. 180 tablet 0  . fluticasone furoate-vilanterol (BREO ELLIPTA) 100-25 MCG/INH AEPB Inhale 1 puff into the lungs daily. 60 each 3  . glucose blood (ACCU-CHEK GUIDE) test strip Use as instructed. Inject into the skin twice daily E11.65 200 each 12  . HYDROcodone-homatropine (HYCODAN) 5-1.5 MG/5ML syrup Take 5 mLs by mouth every 6 (six) hours as needed for cough. 473 mL 0  . ipratropium-albuterol (DUONEB) 0.5-2.5 (3) MG/3ML SOLN Take 3 mLs by nebulization every 4 (four) hours as needed. 360 mL 0  . lisinopril-hydrochlorothiazide (ZESTORETIC) 20-25 MG tablet Take 2 tablets by mouth daily. 60 tablet 3  . metFORMIN (GLUCOPHAGE) 500 MG tablet Take 1 tablet (500 mg total) by mouth daily with breakfast. 90 tablet 1  . montelukast (SINGULAIR) 10 MG tablet Take 1 tablet (10 mg total) by mouth at bedtime. 30 tablet 3  . traZODone (DESYREL) 150 MG tablet TAKE 1 TABLET(150 MG) BY MOUTH AT BEDTIME 90 tablet 1  . fluticasone (FLONASE) 50 MCG/ACT nasal spray Place 1 spray into both nostrils daily for 30 days. 16 g 2   No facility-administered medications prior to visit.    No Known Allergies     Objective:    BP 137/90 (BP Location: Left Arm, Patient Position: Sitting, Cuff Size: Large)   Pulse 96   Temp (!) 97.5 F (36.4 C) (Temporal)   Ht '6\' 1"'  (1.854 m)   Wt (!) 320 lb (145.2 kg)   SpO2 98%   BMI 42.22 kg/m  Wt Readings from Last 3 Encounters:  05/17/19 (!) 320 lb (145.2 kg)  01/10/19 (!) 303 lb (137.4 kg)  06/01/18 296 lb 3.2 oz (134.4 kg)    Physical Exam Constitutional:      Appearance: She is well-developed.  HENT:     Head: Normocephalic.  Cardiovascular:     Rate and Rhythm: Normal rate and regular rhythm.     Heart sounds: Normal heart sounds.  Pulmonary:     Effort: Pulmonary effort is normal.     Breath sounds: Normal breath sounds.  Abdominal:     General: Bowel sounds are normal.     Palpations:  Abdomen is soft.     Hernia: There is no hernia in the left inguinal area.  Genitourinary:    Labia:        Right: No rash, tenderness, lesion or injury.        Left: No rash, tenderness, lesion or injury.      Vagina: Normal. No signs of injury and foreign body. No vaginal discharge, erythema, tenderness or bleeding.     Cervix: No cervical motion tenderness or friability.     Uterus: Not deviated and not enlarged.      Adnexa:  Right: No mass, tenderness or fullness.         Left: No mass, tenderness or fullness.       Rectum: Normal. No external hemorrhoid.  Lymphadenopathy:     Lower Body: No right inguinal adenopathy. No left inguinal adenopathy.  Skin:    General: Skin is warm and dry.  Neurological:     Mental Status: She is alert and oriented to person, place, and time.  Psychiatric:        Behavior: Behavior normal.        Thought Content: Thought content normal.        Judgment: Judgment normal.        Patient has been counseled extensively about nutrition and exercise as well as the importance of adherence with medications and regular follow-up. The patient was given clear instructions to go to ER or return to medical center if symptoms don't improve, worsen or new problems develop. The patient verbalized understanding.   Follow-up: Return in about 3 months (around 08/17/2019).   Gildardo Pounds, FNP-BC [facility] and [place], [place]   05/17/2019, 3:00 PM

## 2019-05-18 LAB — CMP14+EGFR
ALT: 19 IU/L (ref 0–32)
AST: 20 IU/L (ref 0–40)
Albumin/Globulin Ratio: 1.5 (ref 1.2–2.2)
Albumin: 4.4 g/dL (ref 3.8–4.8)
Alkaline Phosphatase: 123 IU/L — ABNORMAL HIGH (ref 39–117)
BUN/Creatinine Ratio: 10 (ref 9–23)
BUN: 10 mg/dL (ref 6–24)
Bilirubin Total: 0.3 mg/dL (ref 0.0–1.2)
CO2: 25 mmol/L (ref 20–29)
Calcium: 9.8 mg/dL (ref 8.7–10.2)
Chloride: 100 mmol/L (ref 96–106)
Creatinine, Ser: 1.01 mg/dL — ABNORMAL HIGH (ref 0.57–1.00)
GFR calc Af Amer: 76 mL/min/{1.73_m2} (ref >59)
GFR calc non Af Amer: 66 mL/min/{1.73_m2} (ref >59)
Globulin, Total: 2.9 g/dL (ref 1.5–4.5)
Glucose: 78 mg/dL (ref 65–99)
Potassium: 4.6 mmol/L (ref 3.5–5.2)
Sodium: 140 mmol/L (ref 134–144)
Total Protein: 7.3 g/dL (ref 6.0–8.5)

## 2019-05-18 LAB — LIPID PANEL
Chol/HDL Ratio: 2.1 ratio (ref 0.0–4.4)
Cholesterol, Total: 212 mg/dL — ABNORMAL HIGH (ref 100–199)
HDL: 101 mg/dL (ref >39)
LDL Chol Calc (NIH): 91 mg/dL (ref 0–99)
Triglycerides: 121 mg/dL (ref 0–149)
VLDL Cholesterol Cal: 20 mg/dL (ref 5–40)

## 2019-05-18 LAB — CERVICOVAGINAL ANCILLARY ONLY
Bacterial Vaginitis (gardnerella): POSITIVE — AB
Candida Glabrata: NEGATIVE
Candida Vaginitis: NEGATIVE
Chlamydia: NEGATIVE
Comment: NEGATIVE
Comment: NEGATIVE
Comment: NEGATIVE
Comment: NEGATIVE
Comment: NEGATIVE
Comment: NORMAL
Neisseria Gonorrhea: NEGATIVE
Trichomonas: NEGATIVE

## 2019-05-18 LAB — HEMOGLOBIN A1C
Est. average glucose Bld gHb Est-mCnc: 117 mg/dL
Hgb A1c MFr Bld: 5.7 % — ABNORMAL HIGH (ref 4.8–5.6)

## 2019-05-18 LAB — CBC
Hematocrit: 43.2 % (ref 34.0–46.6)
Hemoglobin: 14 g/dL (ref 11.1–15.9)
MCH: 26.9 pg (ref 26.6–33.0)
MCHC: 32.4 g/dL (ref 31.5–35.7)
MCV: 83 fL (ref 79–97)
Platelets: 465 10*3/uL — ABNORMAL HIGH (ref 150–450)
RBC: 5.21 x10E6/uL (ref 3.77–5.28)
RDW: 15.3 % (ref 11.7–15.4)
WBC: 10.9 10*3/uL — ABNORMAL HIGH (ref 3.4–10.8)

## 2019-05-19 LAB — CYTOLOGY - PAP
Comment: NEGATIVE
Diagnosis: NEGATIVE
High risk HPV: NEGATIVE

## 2019-05-20 ENCOUNTER — Other Ambulatory Visit: Payer: Self-pay | Admitting: Nurse Practitioner

## 2019-05-20 MED ORDER — METRONIDAZOLE 500 MG PO TABS: 500.0000 mg | ORAL_TABLET | Freq: Two times a day (BID) | ORAL | 0 refills | Status: AC

## 2019-05-24 ENCOUNTER — Encounter: Payer: Self-pay | Admitting: Nurse Practitioner

## 2019-05-25 ENCOUNTER — Other Ambulatory Visit: Payer: Self-pay | Admitting: Nurse Practitioner

## 2019-05-25 MED ORDER — PREDNISONE 20 MG PO TABS: 20.0000 mg | ORAL_TABLET | Freq: Every day | ORAL | 0 refills | Status: AC

## 2019-05-25 MED ORDER — AZITHROMYCIN 250 MG PO TABS: ORAL_TABLET | ORAL | 0 refills | Status: DC

## 2019-07-18 DIAGNOSIS — D473 Essential (hemorrhagic) thrombocythemia: Secondary | ICD-10-CM | POA: Diagnosis not present

## 2019-07-18 DIAGNOSIS — D500 Iron deficiency anemia secondary to blood loss (chronic): Secondary | ICD-10-CM | POA: Diagnosis not present

## 2019-08-12 DIAGNOSIS — M542 Cervicalgia: Secondary | ICD-10-CM | POA: Diagnosis not present

## 2019-08-12 DIAGNOSIS — E119 Type 2 diabetes mellitus without complications: Secondary | ICD-10-CM | POA: Diagnosis not present

## 2019-08-12 DIAGNOSIS — R072 Precordial pain: Secondary | ICD-10-CM | POA: Diagnosis not present

## 2019-08-12 DIAGNOSIS — I10 Essential (primary) hypertension: Secondary | ICD-10-CM | POA: Diagnosis not present

## 2019-08-12 DIAGNOSIS — R079 Chest pain, unspecified: Secondary | ICD-10-CM | POA: Diagnosis not present

## 2019-08-12 DIAGNOSIS — J45909 Unspecified asthma, uncomplicated: Secondary | ICD-10-CM | POA: Diagnosis not present

## 2019-08-12 DIAGNOSIS — K760 Fatty (change of) liver, not elsewhere classified: Secondary | ICD-10-CM | POA: Diagnosis not present

## 2019-08-13 DIAGNOSIS — E119 Type 2 diabetes mellitus without complications: Secondary | ICD-10-CM | POA: Diagnosis not present

## 2019-08-13 DIAGNOSIS — R000 Tachycardia, unspecified: Secondary | ICD-10-CM | POA: Diagnosis not present

## 2019-08-13 DIAGNOSIS — I071 Rheumatic tricuspid insufficiency: Secondary | ICD-10-CM | POA: Diagnosis not present

## 2019-08-13 DIAGNOSIS — I517 Cardiomegaly: Secondary | ICD-10-CM | POA: Diagnosis not present

## 2019-08-13 DIAGNOSIS — J45909 Unspecified asthma, uncomplicated: Secondary | ICD-10-CM | POA: Diagnosis not present

## 2019-08-13 DIAGNOSIS — M542 Cervicalgia: Secondary | ICD-10-CM | POA: Diagnosis not present

## 2019-08-13 DIAGNOSIS — R072 Precordial pain: Secondary | ICD-10-CM | POA: Diagnosis not present

## 2019-08-13 DIAGNOSIS — I10 Essential (primary) hypertension: Secondary | ICD-10-CM | POA: Diagnosis not present

## 2019-08-13 DIAGNOSIS — Z6841 Body Mass Index (BMI) 40.0 and over, adult: Secondary | ICD-10-CM | POA: Diagnosis not present

## 2019-08-13 DIAGNOSIS — R079 Chest pain, unspecified: Secondary | ICD-10-CM | POA: Diagnosis not present

## 2019-08-14 DIAGNOSIS — R079 Chest pain, unspecified: Secondary | ICD-10-CM | POA: Diagnosis not present

## 2019-08-14 DIAGNOSIS — E119 Type 2 diabetes mellitus without complications: Secondary | ICD-10-CM | POA: Diagnosis not present

## 2019-08-14 DIAGNOSIS — J45909 Unspecified asthma, uncomplicated: Secondary | ICD-10-CM | POA: Diagnosis not present

## 2019-08-14 DIAGNOSIS — I10 Essential (primary) hypertension: Secondary | ICD-10-CM | POA: Diagnosis not present

## 2019-08-16 ENCOUNTER — Ambulatory Visit: Payer: Medicaid Other | Attending: Nurse Practitioner | Admitting: Nurse Practitioner

## 2019-08-16 ENCOUNTER — Other Ambulatory Visit: Payer: Self-pay

## 2019-08-16 ENCOUNTER — Encounter: Payer: Self-pay | Admitting: Nurse Practitioner

## 2019-08-16 DIAGNOSIS — Z09 Encounter for follow-up examination after completed treatment for conditions other than malignant neoplasm: Secondary | ICD-10-CM

## 2019-08-16 MED ORDER — BUDESONIDE-FORMOTEROL FUMARATE 80-4.5 MCG/ACT IN AERO
2.00 | INHALATION_SPRAY | RESPIRATORY_TRACT | Status: DC
Start: 2019-08-14 — End: 2019-08-16

## 2019-08-16 MED ORDER — ACETAMINOPHEN 325 MG PO TABS
975.00 | ORAL_TABLET | ORAL | Status: DC
Start: ? — End: 2019-08-16

## 2019-08-16 MED ORDER — MONTELUKAST SODIUM 10 MG PO TABS
10.00 | ORAL_TABLET | ORAL | Status: DC
Start: 2019-08-15 — End: 2019-08-16

## 2019-08-16 MED ORDER — ISOSORBIDE MONONITRATE ER 60 MG PO TB24
120.00 | ORAL_TABLET | ORAL | Status: DC
Start: 2019-08-15 — End: 2019-08-16

## 2019-08-16 MED ORDER — GENERIC EXTERNAL MEDICATION
150.00 | Status: DC
Start: ? — End: 2019-08-16

## 2019-08-16 MED ORDER — TOPIRAMATE 25 MG PO TABS
50.00 | ORAL_TABLET | ORAL | Status: DC
Start: 2019-08-15 — End: 2019-08-16

## 2019-08-16 MED ORDER — SODIUM CHLORIDE 0.9 % IV SOLN
10.00 | INTRAVENOUS | Status: DC
Start: ? — End: 2019-08-16

## 2019-08-16 MED ORDER — METOPROLOL TARTRATE 25 MG PO TABS
25.00 | ORAL_TABLET | ORAL | Status: DC
Start: 2019-08-14 — End: 2019-08-16

## 2019-08-16 MED ORDER — FLUTICASONE PROPIONATE 50 MCG/ACT NA SUSP
1.00 | NASAL | Status: DC
Start: 2019-08-15 — End: 2019-08-16

## 2019-08-16 MED ORDER — BUSPIRONE HCL 5 MG PO TABS
15.00 | ORAL_TABLET | ORAL | Status: DC
Start: ? — End: 2019-08-16

## 2019-08-16 MED ORDER — GENERIC EXTERNAL MEDICATION
Status: DC
Start: ? — End: 2019-08-16

## 2019-08-16 MED ORDER — ONDANSETRON HCL 4 MG/2ML IJ SOLN
4.00 | INTRAMUSCULAR | Status: DC
Start: ? — End: 2019-08-16

## 2019-08-16 MED ORDER — ENOXAPARIN SODIUM 40 MG/0.4ML ~~LOC~~ SOLN
40.00 | SUBCUTANEOUS | Status: DC
Start: 2019-08-15 — End: 2019-08-16

## 2019-08-16 MED ORDER — LORATADINE 10 MG PO TABS
10.00 | ORAL_TABLET | ORAL | Status: DC
Start: 2019-08-15 — End: 2019-08-16

## 2019-08-16 MED ORDER — ASPIRIN 81 MG PO CHEW
81.00 | CHEWABLE_TABLET | ORAL | Status: DC
Start: 2019-08-15 — End: 2019-08-16

## 2019-08-16 MED ORDER — LISINOPRIL 10 MG PO TABS
20.00 | ORAL_TABLET | ORAL | Status: DC
Start: 2019-08-15 — End: 2019-08-16

## 2019-08-16 MED ORDER — AMLODIPINE BESYLATE 5 MG PO TABS
2.50 | ORAL_TABLET | ORAL | Status: DC
Start: ? — End: 2019-08-16

## 2019-08-16 NOTE — Progress Notes (Signed)
Virtual Visit via Telephone Note Due to national recommendations of social distancing due to [place] 19, telehealth visit is felt to be most appropriate for this patient at this time.  I discussed the limitations, risks, security and privacy concerns of performing an evaluation and management service by telephone and the availability of in person appointments. I also discussed with the patient that there may be a patient responsible charge related to this service. The patient expressed understanding and agreed to proceed.    I connected with Tonya Wilkerson on 08/16/19  at   1:30 PM EDT  EDT by telephone and verified that I am speaking with the correct person using two identifiers.   Consent I discussed the limitations, risks, security and privacy concerns of performing an evaluation and management service by telephone and the availability of in person appointments. I also discussed with the patient that there may be a patient responsible charge related to this service. The patient expressed understanding and agreed to proceed.   Location of Patient: Private Residence   Location of Provider: [place] and [place] participating in Telemedicine visit: [name] FNP-BC [place]    History of Present Illness: Telemedicine visit for: HFU  presents to office today for HF/U She was recently admitted in the hospital for 2 days on 08-12-2019 with chest pain. She has a stress test pending as it was not available over the weekend. Hospital Work up did not reveal any acute cardiac or pulmonary abnormalities. ECHO showing mild diastolic dysfunction. CT showing FLD (statin on hold).  Her zestoretic, gabapentin, and amlodipine were discontinued. She was started on Imdur 30 mg daily, lisinopril 10 mg (although she has been taking 20 mg daily) Started on lisinopril 20 mg daily, imdur 30 mg daily and lopressor 12.5 mg BID. Monitoring blood pressure daily with  past few readings as follows:  BP 134/88 BP 125/86 113/77  HR 70-80s.  Started having headaches with taking imdur and metoprolol. Currently have decreased in intensity 5/10. She will continue to take a few more days. If continues I will dc imdur and restart amlodipine. The 10-year ASCVD risk score [name] DC [name]., et al., 2013) is: 2.8%   Values used to calculate the score:     Age: 49 years     Sex: Female     Is Non-Hispanic African American: Yes     Diabetic: Yes     Tobacco smoker: No     Systolic Blood Pressure: 123456 mmHg     Is BP treated: Yes     HDL Cholesterol: 75 mg/dL     Total Cholesterol: 201 mg/dL   She is working on losing weight. I recommended she walk early in the mornings to avoid the heat and humidity with her history of asthma    Past Medical History:  Diagnosis Date  . Anemia   . Asthma   . Hematuria   . Hyperbilirubinemia   . Hypertension   . Morbid obesity with BMI of 40.0-44.9, adult ([place])   . Pneumonia due to infectious organism     Past Surgical History:  Procedure Laterality Date  . TUBAL LIGATION      Family History  Problem Relation Age of Onset  . Hypertension Mother   . Stroke Mother   . Diabetes Maternal Aunt   . Cancer Maternal Grandfather     Social History   Socioeconomic History  . Marital status: Divorced    Spouse name:  Not on file  . Number of children: Not on file  . Years of education: Not on file  . Highest education level: Not on file  Occupational History  . Not on file  Tobacco Use  . Smoking status: Former Research scientist (life sciences)  . Smokeless tobacco: Never Used  Substance and Sexual Activity  . Alcohol use: No  . Drug use: No  . Sexual activity: Never  Other Topics Concern  . Not on file  Social History Narrative  . Not on file   Social Determinants of Health   Financial Resource Strain:   . Difficulty of Paying Living Expenses:   Food Insecurity:   . Worried About Charity fundraiser in the Last Year:   . Academic librarian in the Last Year:   Transportation Needs:   . Film/video editor (Medical):   Marland Kitchen Lack of Transportation (Non-Medical):   Physical Activity:   . Days of Exercise per Week:   . Minutes of Exercise per Session:   Stress:   . Feeling of Stress :   Social Connections:   . Frequency of Communication with Friends and Family:   . Frequency of Social Gatherings with Friends and Family:   . Attends Religious Services:   . Active Member of Clubs or Organizations:   . Attends Archivist Meetings:   Marland Kitchen Marital Status:      Observations/Objective: Awake, alert and oriented x 3   ROS  Assessment and Plan: Mckensey was seen today for hospitalization follow-up.  Diagnoses and all orders for this visit:  Hospital discharge follow-up F/U for DM, HTN, HPL in 3 months.  NEEDS LABS   Follow Up Instructions Return in about 3 months (around 11/16/2019).     I discussed the assessment and treatment plan with the patient. The patient was provided an opportunity to ask questions and all were answered. The patient agreed with the plan and demonstrated an understanding of the instructions.   The patient was advised to call back or seek an in-person evaluation if the symptoms worsen or if the condition fails to improve as anticipated.  I provided 17 minutes of non-face-to-face time during this encounter including median intraservice time, reviewing previous notes, labs, imaging, medications and explaining diagnosis and management.  Gildardo Pounds, FNP-BC

## 2019-08-16 NOTE — Progress Notes (Deleted)
Assessment & Plan:  Tonya Wilkerson was seen today for follow-up.  Diagnoses and all orders for this visit:  Controlled type 2 diabetes mellitus without complication, without long-term current use of insulin ([place])  Essential hypertension  Mild intermittent asthma without complication    Patient has been counseled on age-appropriate routine health concerns for screening and prevention. These are reviewed and up-to-date. Referrals have been placed accordingly. Immunizations are up-to-date or declined.    Subjective:   Chief Complaint  Patient presents with  . Follow-up    Pt. is here for 3 months F.U.    [name] 49 y.o. female presents to office today for F/U She was recently admitted for 2 days on 08-12-2019 with chest pain. Work up  Her zestoretic, gabapentin, and BREO were discontinued. She was started on Imdur 30 mg daily, f Started on lisinopril 20 mg daily, imdur 30 mg daily and lopressor 12.5 mg BID. Norvasc was dc'd.  Taking  BP 134/88 BP 125/86 113/77 HR 70-80s.   Having headaches after taking Imdur and metoprolol. 5/10. Taking lisinopril 20 mg.    Breathing was worse over the weekend.    Discontinued atorvastatin. LFT elevated. She does have FLD. She has stress test.   The 10-year ASCVD risk score [name] DC [name]., et al., 2013) is: 2.8%   Values used to calculate the score:     Age: 18 years     Sex: Female     Is Non-Hispanic African American: Yes     Diabetic: Yes     Tobacco smoker: No     Systolic Blood Pressure: 017 mmHg     Is BP treated: Yes     HDL Cholesterol: 75 mg/dL     Total Cholesterol: 201 mg/dL  ROS  Past Medical History:  Diagnosis Date  . Anemia   . Asthma   . Hematuria   . Hyperbilirubinemia   . Hypertension   . Morbid obesity with BMI of 40.0-44.9, adult ([place])   . Pneumonia due to infectious organism     Past Surgical History:  Procedure Laterality Date  . TUBAL LIGATION      Family History  Problem Relation Age of  Onset  . Hypertension Mother   . Stroke Mother   . Diabetes Maternal Aunt   . Cancer Maternal Grandfather     Social History Reviewed with no changes to be made today.   Outpatient Medications Prior to Visit  Medication Sig Dispense Refill  . Accu-Chek FastClix Lancets MISC Use as instructed. Inject into the skin twice daily E11.65 200 each 6  . azelastine (ASTELIN) 0.1 % nasal spray Place 2 sprays into both nostrils 2 (two) times daily. Use in each nostril as directed 30 mL 12  . azithromycin (ZITHROMAX) 250 MG tablet Take 2 (two) tablets by mouth on day one. Take 1 (one) tablet by mouth on days 2-5. 6 tablet 0  . Blood Glucose Monitoring Suppl (ACCU-CHEK GUIDE) w/Device KIT 1 each by Does not apply route 2 (two) times daily. 1 kit 0  . busPIRone (BUSPAR) 15 MG tablet TAKE 1 TABLET(15 MG) BY MOUTH THREE TIMES DAILY 90 tablet 3  . cetirizine (ZYRTEC) 10 MG tablet TAKE 1 TABLET(10 MG) BY MOUTH DAILY 90 tablet 2  . cyclobenzaprine (FLEXERIL) 5 MG tablet Take 1 tablet (5 mg total) by mouth 3 (three) times daily as needed for muscle spasms. 30 tablet 1  . fluticasone furoate-vilanterol (BREO ELLIPTA) 100-25 MCG/INH AEPB Inhale 1 puff into the  lungs daily. 60 each 3  . glucose blood (ACCU-CHEK GUIDE) test strip Use as instructed. Inject into the skin twice daily E11.65 200 each 12  . HYDROcodone-homatropine (HYCODAN) 5-1.5 MG/5ML syrup Take 5 mLs by mouth every 6 (six) hours as needed for cough. 473 mL 0  . ipratropium-albuterol (DUONEB) 0.5-2.5 (3) MG/3ML SOLN Take 3 mLs by nebulization every 4 (four) hours as needed. 360 mL 0  . isosorbide mononitrate (IMDUR) 30 MG 24 hr tablet Take 30 mg by mouth daily.    Marland Kitchen lisinopril (ZESTRIL) 20 MG tablet Take 20 mg by mouth daily.    . metFORMIN (GLUCOPHAGE) 500 MG tablet Take 1 tablet (500 mg total) by mouth daily with breakfast. 90 tablet 1  . metoprolol tartrate (LOPRESSOR) 25 MG tablet Take 12.5 mg by mouth 2 (two) times daily.     . montelukast  (SINGULAIR) 10 MG tablet Take 1 tablet (10 mg total) by mouth at bedtime. 30 tablet 3  . traZODone (DESYREL) 150 MG tablet TAKE 1 TABLET(150 MG) BY MOUTH AT BEDTIME 90 tablet 1  . amLODipine (NORVASC) 10 MG tablet Take 1 tablet (10 mg total) by mouth daily. 90 tablet 2  . atorvastatin (LIPITOR) 20 MG tablet Take 1 tablet (20 mg total) by mouth daily. (Patient not taking: Reported on 08/16/2019) 90 tablet 3  . ferrous sulfate 325 (65 FE) MG tablet Take 1 tablet (325 mg total) by mouth 2 (two) times daily with a meal. 180 tablet 0  . fluticasone (FLONASE) 50 MCG/ACT nasal spray Place 1 spray into both nostrils daily for 30 days. 16 g 2  . lisinopril-hydrochlorothiazide (ZESTORETIC) 20-25 MG tablet Take 2 tablets by mouth daily. 60 tablet 3   No facility-administered medications prior to visit.    No Known Allergies     Objective:    There were no vitals taken for this visit. Wt Readings from Last 3 Encounters:  05/17/19 (!) 320 lb (145.2 kg)  01/10/19 (!) 303 lb (137.4 kg)  06/01/18 296 lb 3.2 oz (134.4 kg)    Physical Exam       Patient has been counseled extensively about nutrition and exercise as well as the importance of adherence with medications and regular follow-up. The patient was given clear instructions to go to ER or return to medical center if symptoms don't improve, worsen or new problems develop. The patient verbalized understanding.   Follow-up: No follow-ups on file.   Gildardo Pounds, FNP-BC [facility] and [place], [place]   08/16/2019, 3:03 PM

## 2019-08-28 ENCOUNTER — Other Ambulatory Visit: Payer: Self-pay | Admitting: Nurse Practitioner

## 2019-08-28 DIAGNOSIS — I10 Essential (primary) hypertension: Secondary | ICD-10-CM

## 2019-08-28 DIAGNOSIS — E119 Type 2 diabetes mellitus without complications: Secondary | ICD-10-CM

## 2019-08-28 DIAGNOSIS — Z1159 Encounter for screening for other viral diseases: Secondary | ICD-10-CM

## 2019-08-28 DIAGNOSIS — D500 Iron deficiency anemia secondary to blood loss (chronic): Secondary | ICD-10-CM

## 2019-08-28 DIAGNOSIS — E785 Hyperlipidemia, unspecified: Secondary | ICD-10-CM

## 2019-08-30 ENCOUNTER — Other Ambulatory Visit: Payer: Self-pay

## 2019-08-30 ENCOUNTER — Ambulatory Visit: Payer: Medicaid Other | Attending: Family Medicine

## 2019-08-30 DIAGNOSIS — D500 Iron deficiency anemia secondary to blood loss (chronic): Secondary | ICD-10-CM | POA: Diagnosis not present

## 2019-08-30 DIAGNOSIS — E785 Hyperlipidemia, unspecified: Secondary | ICD-10-CM

## 2019-08-30 DIAGNOSIS — E119 Type 2 diabetes mellitus without complications: Secondary | ICD-10-CM

## 2019-08-30 DIAGNOSIS — I10 Essential (primary) hypertension: Secondary | ICD-10-CM | POA: Diagnosis not present

## 2019-08-31 LAB — CMP14+EGFR
ALT: 29 IU/L (ref 0–32)
AST: 28 IU/L (ref 0–40)
Albumin/Globulin Ratio: 1.9 (ref 1.2–2.2)
Albumin: 4.3 g/dL (ref 3.8–4.8)
Alkaline Phosphatase: 103 IU/L (ref 48–121)
BUN/Creatinine Ratio: 8 — ABNORMAL LOW (ref 9–23)
BUN: 8 mg/dL (ref 6–24)
Bilirubin Total: 0.2 mg/dL (ref 0.0–1.2)
CO2: 22 mmol/L (ref 20–29)
Calcium: 9.5 mg/dL (ref 8.7–10.2)
Chloride: 107 mmol/L — ABNORMAL HIGH (ref 96–106)
Creatinine, Ser: 0.97 mg/dL (ref 0.57–1.00)
GFR calc Af Amer: 79 mL/min/{1.73_m2} (ref >59)
GFR calc non Af Amer: 69 mL/min/{1.73_m2} (ref >59)
Globulin, Total: 2.3 g/dL (ref 1.5–4.5)
Glucose: 80 mg/dL (ref 65–99)
Potassium: 5 mmol/L (ref 3.5–5.2)
Sodium: 142 mmol/L (ref 134–144)
Total Protein: 6.6 g/dL (ref 6.0–8.5)

## 2019-08-31 LAB — CBC
Hematocrit: 42.6 % (ref 34.0–46.6)
Hemoglobin: 13.5 g/dL (ref 11.1–15.9)
MCH: 25.9 pg — ABNORMAL LOW (ref 26.6–33.0)
MCHC: 31.7 g/dL (ref 31.5–35.7)
MCV: 82 fL (ref 79–97)
Platelets: 497 10*3/uL — ABNORMAL HIGH (ref 150–450)
RBC: 5.22 x10E6/uL (ref 3.77–5.28)
RDW: 16.9 % — ABNORMAL HIGH (ref 11.7–15.4)
WBC: 7.9 10*3/uL (ref 3.4–10.8)

## 2019-08-31 LAB — LIPID PANEL
Chol/HDL Ratio: 2.1 ratio (ref 0.0–4.4)
Cholesterol, Total: 160 mg/dL (ref 100–199)
HDL: 78 mg/dL (ref >39)
LDL Chol Calc (NIH): 71 mg/dL (ref 0–99)
Triglycerides: 56 mg/dL (ref 0–149)
VLDL Cholesterol Cal: 11 mg/dL (ref 5–40)

## 2019-08-31 LAB — HEMOGLOBIN A1C
Est. average glucose Bld gHb Est-mCnc: 126 mg/dL
Hgb A1c MFr Bld: 6 % — ABNORMAL HIGH (ref 4.8–5.6)

## 2019-09-14 DIAGNOSIS — I10 Essential (primary) hypertension: Secondary | ICD-10-CM | POA: Diagnosis not present

## 2019-09-14 DIAGNOSIS — R079 Chest pain, unspecified: Secondary | ICD-10-CM | POA: Diagnosis not present

## 2019-09-14 DIAGNOSIS — R0789 Other chest pain: Secondary | ICD-10-CM | POA: Diagnosis not present

## 2019-09-23 DIAGNOSIS — I10 Essential (primary) hypertension: Secondary | ICD-10-CM | POA: Diagnosis not present

## 2019-09-23 DIAGNOSIS — R0789 Other chest pain: Secondary | ICD-10-CM | POA: Diagnosis not present

## 2019-11-16 ENCOUNTER — Ambulatory Visit: Payer: Medicaid Other | Admitting: Nurse Practitioner

## 2019-12-28 ENCOUNTER — Ambulatory Visit: Payer: Medicaid Other | Attending: Nurse Practitioner | Admitting: Nurse Practitioner

## 2019-12-28 ENCOUNTER — Encounter: Payer: Self-pay | Admitting: Nurse Practitioner

## 2019-12-28 ENCOUNTER — Other Ambulatory Visit: Payer: Self-pay

## 2019-12-28 ENCOUNTER — Other Ambulatory Visit (HOSPITAL_COMMUNITY)
Admission: RE | Admit: 2019-12-28 | Discharge: 2019-12-28 | Disposition: A | Discharge status: Home / Self Care | Payer: Medicaid Other | Source: Ambulatory Visit | Attending: Nurse Practitioner | Admitting: Nurse Practitioner

## 2019-12-28 VITALS — BP 149/100 | HR 78 | Temp 97.7°F | Ht 73.0 in | Wt 322.0 lb

## 2019-12-28 DIAGNOSIS — J4520 Mild intermittent asthma, uncomplicated: Secondary | ICD-10-CM

## 2019-12-28 DIAGNOSIS — I10 Essential (primary) hypertension: Secondary | ICD-10-CM | POA: Diagnosis not present

## 2019-12-28 DIAGNOSIS — N938 Other specified abnormal uterine and vaginal bleeding: Secondary | ICD-10-CM | POA: Diagnosis not present

## 2019-12-28 DIAGNOSIS — Z1231 Encounter for screening mammogram for malignant neoplasm of breast: Secondary | ICD-10-CM | POA: Diagnosis not present

## 2019-12-28 DIAGNOSIS — N760 Acute vaginitis: Secondary | ICD-10-CM

## 2019-12-28 DIAGNOSIS — Z114 Encounter for screening for human immunodeficiency virus [HIV]: Secondary | ICD-10-CM | POA: Diagnosis not present

## 2019-12-28 DIAGNOSIS — E119 Type 2 diabetes mellitus without complications: Secondary | ICD-10-CM | POA: Diagnosis not present

## 2019-12-28 DIAGNOSIS — D500 Iron deficiency anemia secondary to blood loss (chronic): Secondary | ICD-10-CM

## 2019-12-28 DIAGNOSIS — F4322 Adjustment disorder with anxiety: Secondary | ICD-10-CM

## 2019-12-28 DIAGNOSIS — N921 Excessive and frequent menstruation with irregular cycle: Secondary | ICD-10-CM | POA: Diagnosis not present

## 2019-12-28 DIAGNOSIS — Z1159 Encounter for screening for other viral diseases: Secondary | ICD-10-CM

## 2019-12-28 DIAGNOSIS — Z1211 Encounter for screening for malignant neoplasm of colon: Secondary | ICD-10-CM | POA: Diagnosis not present

## 2019-12-28 DIAGNOSIS — F4323 Adjustment disorder with mixed anxiety and depressed mood: Secondary | ICD-10-CM

## 2019-12-28 LAB — POCT URINALYSIS DIP (CLINITEK)
Blood, UA: NEGATIVE
Glucose, UA: NEGATIVE mg/dL
Nitrite, UA: NEGATIVE
Spec Grav, UA: 1.02 (ref 1.010–1.025)
Urobilinogen, UA: 2 E.U./dL — AB
pH, UA: 6 (ref 5.0–8.0)

## 2019-12-28 LAB — GLUCOSE, POCT (MANUAL RESULT ENTRY): POC Glucose: 109 mg/dl — AB (ref 70–99)

## 2019-12-28 MED ORDER — FERROUS SULFATE 325 (65 FE) MG PO TABS: 325.0000 mg | ORAL_TABLET | Freq: Two times a day (BID) | ORAL | 0 refills | Status: DC

## 2019-12-28 MED ORDER — BREO ELLIPTA 100-25 MCG/INH IN AEPB: 1.0000 | INHALATION_SPRAY | Freq: Every day | RESPIRATORY_TRACT | 3 refills | Status: DC

## 2019-12-28 MED ORDER — LISINOPRIL 40 MG PO TABS: 40.0000 mg | ORAL_TABLET | Freq: Every day | ORAL | 1 refills | Status: DC

## 2019-12-28 MED ORDER — LISINOPRIL-HYDROCHLOROTHIAZIDE 20-25 MG PO TABS: 2.0000 | ORAL_TABLET | Freq: Every day | ORAL | 0 refills | Status: DC

## 2019-12-28 MED ORDER — ALBUTEROL SULFATE HFA 108 (90 BASE) MCG/ACT IN AERS: 2.0000 | INHALATION_SPRAY | Freq: Four times a day (QID) | RESPIRATORY_TRACT | 2 refills | Status: DC | PRN

## 2019-12-28 NOTE — Progress Notes (Signed)
Assessment & Plan:  Tonya Wilkerson was seen today for follow-up.  Diagnoses and all orders for this visit:  Essential hypertension -     Basic metabolic panel -     lisinopril (ZESTRIL) 40 MG tablet; Take 1 tablet (40 mg total) by mouth daily. Continue all antihypertensives as prescribed.  Remember to bring in your blood pressure log with you for your follow up appointment.  DASH/Mediterranean Diets are healthier choices for HTN.   Controlled type 2 diabetes mellitus without complication, without long-term current use of insulin (HCC) -     Glucose (CBG) -     Cancel: HgB A1c Continue blood sugar control as discussed in office today, low carbohydrate diet, and regular physical exercise as tolerated, 150 minutes per week (30 min each day, 5 days per week, or 50 min 3 days per week). Keep blood sugar logs with fasting goal of 90-130 mg/dl, post prandial (after you eat) less than 180.  For Hypoglycemia: BS <60 and Hyperglycemia BS >400; contact the clinic ASAP. Annual eye exams and foot exams are recommended.   Mild intermittent asthma without complication -     fluticasone furoate-vilanterol (BREO ELLIPTA) 100-25 MCG/INH AEPB; Inhale 1 puff into the lungs daily. -     albuterol (VENTOLIN HFA) 108 (90 Base) MCG/ACT inhaler; Inhale 2 puffs into the lungs every 6 (six) hours as needed for wheezing or shortness of breath.  Menorrhagia with irregular cycle -     Ambulatory referral to Gynecology  Acute vaginitis -     Cervicovaginal ancillary only -     POCT URINALYSIS DIP (CLINITEK)  Adjustment disorder with mixed anxiety and depressed mood -     Ambulatory referral to Psychiatry  Iron deficiency anemia due to chronic blood loss -     CBC -     Iron, TIBC and Ferritin Panel -     ferrous sulfate 325 (65 FE) MG tablet; Take 1 tablet (325 mg total) by mouth 2 (two) times daily with a meal.  Encounter for screening for HIV -     HIV antibody (with reflex)  Breast cancer screening by  mammogram -     MM 3D SCREEN BREAST BILATERAL; Future  Need for hepatitis C screening test -     Hepatitis C Antibody   Patient has been counseled on age-appropriate routine health concerns for screening and prevention. These are reviewed and up-to-date. Referrals have been placed accordingly. Immunizations are up-to-date or declined.    Subjective:   Chief Complaint  Patient presents with  . Follow-up    Pt. is here for hypertension follow up.    [name] 49 y.o. female presents to office today for follow up.  has a past medical history of Anemia, Asthma, Hematuria, Hyperbilirubinemia, Hypertension, Morbid obesity with BMI of 40.0-44.9, adult ([place]), and Pneumonia due to infectious organism.  Her blood pressure is not well controlled. Unfortunately she has stopped taking most of her medications. She feels she is taking entirely too many. Not pleased that her zestoretic had been discontinued by Cardiology. She feels it works better in regard to fluid retention and her Blood pressure. She was taking 20-25 mg 2 tablets once a day. ECHO: normal LV systolic function with EF 24-46% and mild diastolic dysfunction. ( per note 08-16-2019 AVOID RESUMING HCTZ DUE TO NEED TO AVOID DEHYDRATION W. HYPERDYNAMIC FUNCTION ON ECHO)   OSA: not regularly using CPAP. Finds it uncomfortable.    I recommended she speak with a psychiatrist  for grief counseling and mild depression. Her mother passed around this same time of the year. I believe her grief may be contributing to adherence with medications.  Depression screen [facility] 2/9 12/28/2019 08/16/2019 05/17/2019 07/13/2018 06/01/2018  Decreased Interest 1 0 1 0 2  Down, Depressed, Hopeless 0 0 0 0 0  PHQ - 2 Score 1 0 1 0 2  Altered sleeping 2 0 '2 1 2  ' Tired, decreased energy 2 0 '2 3 3  ' Change in appetite 0 0 1 0 2  Feeling bad or failure about yourself  0 0 0 0 0  Trouble concentrating 0 0 0 0 0  Moving slowly or fidgety/restless 0 0 0 0 0  Suicidal  thoughts 0 0 0 0 0  PHQ-9 Score 5 0 '6 4 9  ' Some encounter information is confidential and restricted. Go to Review Flowsheets activity to see all data.  Some recent data might be hidden     DM TYPE 2 Well controlled at t his time. She has not been monitoring her blood glucose levels routinely at home.  Lab Results  Component Value Date   HGBA1C 6.0 (H) 08/30/2019   Lab Results  Component Value Date   LDLCALC 71 08/30/2019  The 10-year ASCVD risk score [name] DC Jr., et al., 2013) is: 6.9%   Values used to calculate the score:     Age: 58 years     Sex: Female     Is Non-Hispanic African American: Yes     Diabetic: Yes     Tobacco smoker: No     Systolic Blood Pressure: 856 mmHg     Is BP treated: Yes     HDL Cholesterol: 78 mg/dL     Total Cholesterol: 160 mg/dL   Essential Hypertension  Not well controlled. Increasing lisinopril to 40 mg daily. Zestoretic and amlodipine dc'd by cardiology. She is to continue on lopressor 12.5 mg BID, imdur 30 mg daily. Marland Kitchenzwfd BP Readings from Last 3 Encounters:  12/28/19 (!) 149/100  05/17/19 137/90  01/10/19 103/71   Review of Systems  Constitutional: Negative for fever, malaise/fatigue and weight loss.  HENT: Negative.  Negative for nosebleeds.   Eyes: Negative.  Negative for blurred vision, double vision and photophobia.  Respiratory: Negative.  Negative for cough, shortness of breath and wheezing.   Cardiovascular: Negative.  Negative for chest pain, palpitations and leg swelling.  Gastrointestinal: Negative.  Negative for heartburn, nausea and vomiting.  Musculoskeletal: Negative.  Negative for myalgias.  Neurological: Negative.  Negative for dizziness, focal weakness, seizures and headaches.  Psychiatric/Behavioral: Negative.  Negative for suicidal ideas.    Past Medical History:  Diagnosis Date  . Anemia   . Asthma   . Hematuria   . Hyperbilirubinemia   . Hypertension   . Morbid obesity with BMI of 40.0-44.9, adult ([place])     . Pneumonia due to infectious organism     Past Surgical History:  Procedure Laterality Date  . TUBAL LIGATION      Family History  Problem Relation Age of Onset  . Hypertension Mother   . Stroke Mother   . Diabetes Maternal Aunt   . Cancer Maternal Grandfather     Social History Reviewed with no changes to be made today.   Outpatient Medications Prior to Visit  Medication Sig Dispense Refill  . Accu-Chek FastClix Lancets MISC Use as instructed. Inject into the skin twice daily E11.65 200 each 6  . Blood Glucose Monitoring Suppl (ACCU-CHEK GUIDE)  w/Device KIT 1 each by Does not apply route 2 (two) times daily. 1 kit 0  . azelastine (ASTELIN) 0.1 % nasal spray Place 2 sprays into both nostrils 2 (two) times daily. Use in each nostril as directed (Patient not taking: Reported on 12/28/2019) 30 mL 12  . busPIRone (BUSPAR) 15 MG tablet TAKE 1 TABLET(15 MG) BY MOUTH THREE TIMES DAILY (Patient not taking: Reported on 12/28/2019) 90 tablet 3  . cetirizine (ZYRTEC) 10 MG tablet TAKE 1 TABLET(10 MG) BY MOUTH DAILY (Patient not taking: Reported on 12/28/2019) 90 tablet 2  . cyclobenzaprine (FLEXERIL) 5 MG tablet Take 1 tablet (5 mg total) by mouth 3 (three) times daily as needed for muscle spasms. (Patient not taking: Reported on 12/28/2019) 30 tablet 1  . fluticasone (FLONASE) 50 MCG/ACT nasal spray Place 1 spray into both nostrils daily for 30 days. 16 g 2  . glucose blood (ACCU-CHEK GUIDE) test strip Use as instructed. Inject into the skin twice daily E11.65 (Patient not taking: Reported on 12/28/2019) 200 each 12  . ipratropium-albuterol (DUONEB) 0.5-2.5 (3) MG/3ML SOLN Take 3 mLs by nebulization every 4 (four) hours as needed. (Patient not taking: Reported on 12/28/2019) 360 mL 0  . isosorbide mononitrate (IMDUR) 30 MG 24 hr tablet Take 30 mg by mouth daily. (Patient not taking: Reported on 12/28/2019)    . metFORMIN (GLUCOPHAGE) 500 MG tablet Take 1 tablet (500 mg total) by mouth daily  with breakfast. (Patient not taking: Reported on 12/28/2019) 90 tablet 1  . metoprolol tartrate (LOPRESSOR) 25 MG tablet Take 12.5 mg by mouth 2 (two) times daily.  (Patient not taking: Reported on 12/28/2019)    . montelukast (SINGULAIR) 10 MG tablet Take 1 tablet (10 mg total) by mouth at bedtime. (Patient not taking: Reported on 12/28/2019) 30 tablet 3  . traZODone (DESYREL) 150 MG tablet TAKE 1 TABLET(150 MG) BY MOUTH AT BEDTIME (Patient not taking: Reported on 12/28/2019) 90 tablet 1  . ferrous sulfate 325 (65 FE) MG tablet Take 1 tablet (325 mg total) by mouth 2 (two) times daily with a meal. 180 tablet 0  . fluticasone furoate-vilanterol (BREO ELLIPTA) 100-25 MCG/INH AEPB Inhale 1 puff into the lungs daily. 60 each 3  . HYDROcodone-homatropine (HYCODAN) 5-1.5 MG/5ML syrup Take 5 mLs by mouth every 6 (six) hours as needed for cough. (Patient not taking: Reported on 12/28/2019) 473 mL 0  . lisinopril (ZESTRIL) 20 MG tablet Take 20 mg by mouth daily. (Patient not taking: Reported on 12/28/2019)     No facility-administered medications prior to visit.    Allergies  Allergen Reactions  . Hctz [Hydrochlorothiazide] Other (See Comments)     AVOID RESUMING HCTZ DUE TO NEED TO AVOID DEHYDRATION W. HYPERDYNAMIC FUNCTION ON ECHO.   Depression screen [facility] 2/9 12/28/2019 08/16/2019 05/17/2019 07/13/2018 06/01/2018  Decreased Interest 1 0 1 0 2  Down, Depressed, Hopeless 0 0 0 0 0  PHQ - 2 Score 1 0 1 0 2  Altered sleeping 2 0 '2 1 2  ' Tired, decreased energy 2 0 '2 3 3  ' Change in appetite 0 0 1 0 2  Feeling bad or failure about yourself  0 0 0 0 0  Trouble concentrating 0 0 0 0 0  Moving slowly or fidgety/restless 0 0 0 0 0  Suicidal thoughts 0 0 0 0 0  PHQ-9 Score 5 0 '6 4 9  ' Some encounter information is confidential and restricted. Go to Review Flowsheets activity to see all data.  Some  recent data might be hidden   Lab Results  Component Value Date   LDLCALC 71 08/30/2019       Objective:     BP (!) 149/100 (BP Location: Left Arm, Patient Position: Sitting, Cuff Size: Large)   Pulse 78   Temp 97.7 F (36.5 C) (Temporal)   Ht '6\' 1"'  (1.854 m)   Wt (!) 322 lb (146.1 kg)   LMP 12/19/2019   SpO2 98%   BMI 42.48 kg/m  Wt Readings from Last 3 Encounters:  12/28/19 (!) 322 lb (146.1 kg)  05/17/19 (!) 320 lb (145.2 kg)  01/10/19 (!) 303 lb (137.4 kg)    Physical Exam Vitals and nursing note reviewed.  Constitutional:      Appearance: She is well-developed.  HENT:     Head: Normocephalic and atraumatic.  Cardiovascular:     Rate and Rhythm: Normal rate and regular rhythm.     Heart sounds: Normal heart sounds. No murmur heard.  No friction rub. No gallop.   Pulmonary:     Effort: Pulmonary effort is normal. No tachypnea or respiratory distress.     Breath sounds: Normal breath sounds. No decreased breath sounds, wheezing, rhonchi or rales.  Chest:     Chest wall: No tenderness.  Abdominal:     General: Bowel sounds are normal.     Palpations: Abdomen is soft.  Musculoskeletal:        General: Normal range of motion.     Cervical back: Normal range of motion.  Skin:    General: Skin is warm and dry.  Neurological:     Mental Status: She is alert and oriented to person, place, and time.     Coordination: Coordination normal.  Psychiatric:        Attention and Perception: Attention and perception normal.        Mood and Affect: Affect is tearful.        Speech: Speech normal.        Behavior: Behavior normal. Behavior is cooperative.        Thought Content: Thought content normal.        Cognition and Memory: Cognition and memory normal.        Judgment: Judgment normal.          Patient has been counseled extensively about nutrition and exercise as well as the importance of adherence with medications and regular follow-up. The patient was given clear instructions to go to ER or return to medical center if symptoms don't improve, worsen or new problems  develop. The patient verbalized understanding.   Follow-up: Return for SEE LUKE FOR BP CHECK 2 WEEKS and bmp> SEE ME IN 3 months.   Gildardo Pounds, FNP-BC [facility] and [facility] [place], [place]

## 2019-12-29 LAB — BASIC METABOLIC PANEL WITH GFR
BUN/Creatinine Ratio: 8 — ABNORMAL LOW (ref 9–23)
BUN: 7 mg/dL (ref 6–24)
CO2: 21 mmol/L (ref 20–29)
Calcium: 9.4 mg/dL (ref 8.7–10.2)
Chloride: 100 mmol/L (ref 96–106)
Creatinine, Ser: 0.87 mg/dL (ref 0.57–1.00)
GFR calc Af Amer: 90 mL/min/{1.73_m2}
GFR calc non Af Amer: 78 mL/min/{1.73_m2}
Glucose: 97 mg/dL (ref 65–99)
Potassium: 4.3 mmol/L (ref 3.5–5.2)
Sodium: 138 mmol/L (ref 134–144)

## 2019-12-29 LAB — IRON,TIBC AND FERRITIN PANEL
Ferritin: 7 ng/mL — ABNORMAL LOW (ref 15–150)
Iron Saturation: 3 % — CL (ref 15–55)
Iron: 12 ug/dL — ABNORMAL LOW (ref 27–159)
Total Iron Binding Capacity: 454 ug/dL — ABNORMAL HIGH (ref 250–450)
UIBC: 442 ug/dL — ABNORMAL HIGH (ref 131–425)

## 2019-12-29 LAB — CBC
Hematocrit: 35 % (ref 34.0–46.6)
Hemoglobin: 10.1 g/dL — ABNORMAL LOW (ref 11.1–15.9)
MCH: 20.1 pg — ABNORMAL LOW (ref 26.6–33.0)
MCHC: 28.9 g/dL — ABNORMAL LOW (ref 31.5–35.7)
MCV: 70 fL — ABNORMAL LOW (ref 79–97)
Platelets: 746 10*3/uL — ABNORMAL HIGH (ref 150–450)
RBC: 5.03 x10E6/uL (ref 3.77–5.28)
RDW: 18.3 % — ABNORMAL HIGH (ref 11.7–15.4)
WBC: 8.3 10*3/uL (ref 3.4–10.8)

## 2019-12-29 LAB — HEPATITIS C ANTIBODY: Hep C Virus Ab: 0.1 s/co ratio (ref 0.0–0.9)

## 2019-12-29 LAB — HIV ANTIBODY (ROUTINE TESTING W REFLEX): HIV Screen 4th Generation wRfx: NONREACTIVE

## 2019-12-30 ENCOUNTER — Encounter: Payer: Self-pay | Admitting: Nurse Practitioner

## 2019-12-30 LAB — CERVICOVAGINAL ANCILLARY ONLY
Bacterial Vaginitis (gardnerella): POSITIVE — AB
Candida Glabrata: NEGATIVE
Candida Vaginitis: NEGATIVE
Chlamydia: NEGATIVE
Comment: NEGATIVE
Comment: NEGATIVE
Comment: NEGATIVE
Comment: NEGATIVE
Comment: NEGATIVE
Comment: NORMAL
Neisseria Gonorrhea: NEGATIVE
Trichomonas: NEGATIVE

## 2019-12-30 MED ORDER — METRONIDAZOLE 500 MG PO TABS: 500.0000 mg | ORAL_TABLET | Freq: Two times a day (BID) | ORAL | 0 refills | Status: AC

## 2020-01-02 ENCOUNTER — Telehealth: Payer: Self-pay

## 2020-01-02 NOTE — Telephone Encounter (Signed)
BREO PA APPROVED BY INSURANCE THRU 01/01/21

## 2020-01-12 ENCOUNTER — Ambulatory Visit: Payer: Medicaid Other | Admitting: Pharmacist

## 2020-02-21 ENCOUNTER — Encounter: Payer: Self-pay | Admitting: Nurse Practitioner

## 2020-02-23 ENCOUNTER — Other Ambulatory Visit: Payer: Self-pay

## 2020-02-23 ENCOUNTER — Ambulatory Visit: Payer: Medicaid Other | Admitting: Obstetrics and Gynecology

## 2020-02-23 ENCOUNTER — Encounter: Payer: Self-pay | Admitting: Obstetrics and Gynecology

## 2020-02-23 VITALS — BP 155/105 | HR 87 | Resp 16 | Ht 70.0 in | Wt 325.0 lb

## 2020-02-23 DIAGNOSIS — N939 Abnormal uterine and vaginal bleeding, unspecified: Secondary | ICD-10-CM | POA: Diagnosis not present

## 2020-02-23 MED ORDER — TRANEXAMIC ACID 650 MG PO TABS: 1300.0000 mg | ORAL_TABLET | Freq: Three times a day (TID) | ORAL | 2 refills | Status: DC

## 2020-02-23 NOTE — Progress Notes (Signed)
GYNECOLOGY OFFICE NOTE  History:  49 y.o. P1 here today for heavy periods. Very irregular and unpredictable, worsening over the years. Generally gets one period a month but onset is variable. Reports she bleeds 10-12 days out of the month. Reports bleeding is very, very heavy and she can go through a box of tampons in less than a day, has to wear diapers. Reports she is anemic and has had to have iron infusions in the past. Has not seen a Gyn or tried medication/management for her heavy periods..    Past Medical History:  Diagnosis Date  . Anemia   . Asthma   . Hematuria   . Hyperbilirubinemia   . Hypertension   . Morbid obesity with BMI of 40.0-44.9, adult ([place])   . Pneumonia due to infectious organism     Past Surgical History:  Procedure Laterality Date  . TUBAL LIGATION       Current Outpatient Medications:  .  Accu-Chek FastClix Lancets MISC, Use as instructed. Inject into the skin twice daily E11.65, Disp: 200 each, Rfl: 6 .  albuterol (VENTOLIN HFA) 108 (90 Base) MCG/ACT inhaler, Inhale 2 puffs into the lungs every 6 (six) hours as needed for wheezing or shortness of breath., Disp: 8 g, Rfl: 2 .  amLODipine (NORVASC) 2.5 MG tablet, Take by mouth., Disp: , Rfl:  .  aspirin 81 MG chewable tablet, Chew by mouth., Disp: , Rfl:  .  atorvastatin (LIPITOR) 20 MG tablet, Take by mouth., Disp: , Rfl:  .  azelastine (ASTELIN) 0.1 % nasal spray, Place 2 sprays into both nostrils 2 (two) times daily. Use in each nostril as directed, Disp: 30 mL, Rfl: 12 .  Blood Glucose Monitoring Suppl (ACCU-CHEK GUIDE) w/Device KIT, 1 each by Does not apply route 2 (two) times daily., Disp: 1 kit, Rfl: 0 .  busPIRone (BUSPAR) 15 MG tablet, TAKE 1 TABLET(15 MG) BY MOUTH THREE TIMES DAILY, Disp: 90 tablet, Rfl: 3 .  cetirizine (ZYRTEC) 10 MG tablet, TAKE 1 TABLET(10 MG) BY MOUTH DAILY, Disp: 90 tablet, Rfl: 2 .  cyclobenzaprine (FLEXERIL) 5 MG tablet, Take 1 tablet (5 mg total) by mouth 3 (three) times  daily as needed for muscle spasms., Disp: 30 tablet, Rfl: 1 .  ferrous sulfate 325 (65 FE) MG tablet, Take 1 tablet (325 mg total) by mouth 2 (two) times daily with a meal., Disp: 180 tablet, Rfl: 0 .  fluticasone furoate-vilanterol (BREO ELLIPTA) 100-25 MCG/INH AEPB, Inhale 1 puff into the lungs daily., Disp: 60 each, Rfl: 3 .  glucose blood (ACCU-CHEK GUIDE) test strip, Use as instructed. Inject into the skin twice daily E11.65, Disp: 200 each, Rfl: 12 .  ipratropium-albuterol (DUONEB) 0.5-2.5 (3) MG/3ML SOLN, Take 3 mLs by nebulization every 4 (four) hours as needed., Disp: 360 mL, Rfl: 0 .  isosorbide mononitrate (IMDUR) 30 MG 24 hr tablet, Take 30 mg by mouth daily., Disp: , Rfl:  .  lisinopril (ZESTRIL) 40 MG tablet, Take 1 tablet (40 mg total) by mouth daily., Disp: 90 tablet, Rfl: 1 .  metFORMIN (GLUCOPHAGE) 500 MG tablet, Take 1 tablet (500 mg total) by mouth daily with breakfast., Disp: 90 tablet, Rfl: 1 .  metoprolol tartrate (LOPRESSOR) 25 MG tablet, Take 12.5 mg by mouth 2 (two) times daily., Disp: , Rfl:  .  montelukast (SINGULAIR) 10 MG tablet, Take 1 tablet (10 mg total) by mouth at bedtime., Disp: 30 tablet, Rfl: 3 .  traZODone (DESYREL) 150 MG tablet, TAKE 1 TABLET(150 MG)  BY MOUTH AT BEDTIME, Disp: 90 tablet, Rfl: 1 .  fluticasone (FLONASE) 50 MCG/ACT nasal spray, Place 1 spray into both nostrils daily for 30 days., Disp: 16 g, Rfl: 2  The following portions of the patient's history were reviewed and updated as appropriate: allergies, current medications, past family history, past medical history, past social history, past surgical history and problem list.   Review of Systems:  Pertinent items noted in HPI and remainder of comprehensive ROS otherwise negative.   Objective:  Physical Exam BP (!) 171/100   Pulse 87   Resp 16   Ht _0  (1.778 m)   Wt (!) 325 lb (147.4 kg)   BMI 46.63 kg/m  CONSTITUTIONAL: Well-developed, well-nourished female in no acute distress.   HENT:  Normocephalic, atraumatic. External right and left ear normal. Oropharynx is clear and moist EYES: Conjunctivae and EOM are normal. Pupils are equal, round, and reactive to light. No scleral icterus.  NECK: Normal range of motion, supple, no masses SKIN: Skin is warm and dry. No rash noted. Not diaphoretic. No erythema. No pallor. NEUROLOGIC: Alert and oriented to person, place, and time. Normal reflexes, muscle tone coordination. No cranial nerve deficit noted. PSYCHIATRIC: Normal mood and affect. Normal behavior. Normal judgment and thought content. CARDIOVASCULAR: Normal heart rate noted RESPIRATORY: Effort normal, no problems with respiration noted ABDOMEN: Soft, no distention noted.   PELVIC: deferred MUSCULOSKELETAL: Normal range of motion. No edema noted.   Labs and Imaging No results found.  Assessment & Plan:  1. Abnormal uterine bleeding (AUB) Has taken CHC in past with no improvement, ready for management - okay with trial of lysteda pending workup - Rx sent to pharmacy - return for EMB, exam and discussion of management - US PELVIC COMPLETE WITH TRANSVAGINAL; Future   Routine preventative health maintenance measures emphasized. Please refer to After Visit Summary for other counseling recommendations.   Return in about 4 weeks (around 03/22/2020) for Followup, in person, EMB.  Total face-to-face time with patient: 15 minutes. Over 50% of encounter was spent on counseling and coordination of care.  Feliz Beam, M.D. Attending [facility] for [employer] [facility])

## 2020-02-27 ENCOUNTER — Other Ambulatory Visit: Payer: Medicaid Other

## 2020-03-08 ENCOUNTER — Other Ambulatory Visit: Payer: Self-pay

## 2020-03-08 ENCOUNTER — Ambulatory Visit (INDEPENDENT_AMBULATORY_CARE_PROVIDER_SITE_OTHER): Payer: Medicaid Other

## 2020-03-08 DIAGNOSIS — N939 Abnormal uterine and vaginal bleeding, unspecified: Secondary | ICD-10-CM | POA: Diagnosis not present

## 2020-03-08 DIAGNOSIS — D250 Submucous leiomyoma of uterus: Secondary | ICD-10-CM | POA: Diagnosis not present

## 2020-03-08 DIAGNOSIS — D259 Leiomyoma of uterus, unspecified: Secondary | ICD-10-CM | POA: Diagnosis not present

## 2020-03-08 DIAGNOSIS — N888 Other specified noninflammatory disorders of cervix uteri: Secondary | ICD-10-CM | POA: Diagnosis not present

## 2020-03-08 DIAGNOSIS — D251 Intramural leiomyoma of uterus: Secondary | ICD-10-CM | POA: Diagnosis not present

## 2020-03-17 DIAGNOSIS — U071 COVID-19: Secondary | ICD-10-CM

## 2020-03-17 HISTORY — DX: COVID-19: U07.1

## 2020-03-19 ENCOUNTER — Encounter: Payer: Self-pay | Admitting: Nurse Practitioner

## 2020-03-20 ENCOUNTER — Other Ambulatory Visit: Payer: Self-pay | Admitting: Nurse Practitioner

## 2020-03-20 MED ORDER — PREDNISONE 20 MG PO TABS: 40.0000 mg | ORAL_TABLET | Freq: Every day | ORAL | 0 refills | Status: AC

## 2020-03-20 MED ORDER — BENZONATATE 200 MG PO CAPS: 200.0000 mg | ORAL_CAPSULE | Freq: Three times a day (TID) | ORAL | 0 refills | Status: AC | PRN

## 2020-03-20 MED ORDER — GUAIFENESIN-DM 100-10 MG/5ML PO SYRP: 10.0000 mL | ORAL_SOLUTION | ORAL | 0 refills | Status: DC | PRN

## 2020-03-22 ENCOUNTER — Other Ambulatory Visit: Payer: Medicaid Other | Admitting: Obstetrics and Gynecology

## 2020-03-22 DIAGNOSIS — R059 Cough, unspecified: Secondary | ICD-10-CM | POA: Diagnosis not present

## 2020-03-22 DIAGNOSIS — U071 COVID-19: Secondary | ICD-10-CM | POA: Diagnosis not present

## 2020-03-22 DIAGNOSIS — Z20822 Contact with and (suspected) exposure to covid-19: Secondary | ICD-10-CM | POA: Diagnosis not present

## 2020-03-27 ENCOUNTER — Ambulatory Visit: Payer: Medicaid Other | Admitting: Nurse Practitioner

## 2020-03-29 ENCOUNTER — Telehealth: Payer: Self-pay | Admitting: *Deleted

## 2020-03-29 NOTE — Telephone Encounter (Signed)
Left patient a message to call and reschedule appointment on 04/02/2020 with Dr. Rosana Hoes due to the office closing for weather.

## 2020-04-02 ENCOUNTER — Other Ambulatory Visit: Payer: Medicaid Other | Admitting: Obstetrics and Gynecology

## 2020-04-08 ENCOUNTER — Other Ambulatory Visit: Payer: Self-pay | Admitting: Nurse Practitioner

## 2020-04-08 DIAGNOSIS — G4709 Other insomnia: Secondary | ICD-10-CM

## 2020-04-08 NOTE — Telephone Encounter (Signed)
Requested Prescriptions  Pending Prescriptions Disp Refills  . traZODone (DESYREL) 150 MG tablet [Pharmacy Med Name: TRAZODONE 150MG  (HUNDRED-FIFTY) TAB] 90 tablet 1    Sig: TAKE 1 TABLET(150 MG) BY MOUTH AT BEDTIME     Psychiatry: Antidepressants - Serotonin Modulator Passed - 04/08/2020  4:23 PM      Passed - Valid encounter within last 6 months    Recent Outpatient Visits          3 months ago Essential hypertension   [place], Vernia Buff, NP   7 months ago Hospital discharge follow-up   [place] Gildardo Pounds, NP   10 months ago Encounter for Papanicolaou smear for cervical cancer screening   [place], Vernia Buff, NP   11 months ago Abnormal uterine bleeding (AUB)   [place], Vernia Buff, NP   1 year ago Severe persistent asthma with acute exacerbation   [place], Vernia Buff, NP      Future Appointments            In 2 weeks Gildardo Pounds, NP [place]

## 2020-04-19 ENCOUNTER — Other Ambulatory Visit: Payer: Self-pay

## 2020-04-19 ENCOUNTER — Other Ambulatory Visit: Payer: Self-pay | Admitting: Obstetrics and Gynecology

## 2020-04-19 ENCOUNTER — Ambulatory Visit: Payer: Medicaid Other | Admitting: Obstetrics and Gynecology

## 2020-04-19 ENCOUNTER — Encounter: Payer: Self-pay | Admitting: Obstetrics and Gynecology

## 2020-04-19 VITALS — BP 192/112 | HR 95 | Resp 16 | Ht 70.0 in | Wt 325.0 lb

## 2020-04-19 DIAGNOSIS — N939 Abnormal uterine and vaginal bleeding, unspecified: Secondary | ICD-10-CM | POA: Diagnosis not present

## 2020-04-19 LAB — POCT URINE PREGNANCY: Preg Test, Ur: NEGATIVE

## 2020-04-19 NOTE — Progress Notes (Signed)
ENDOMETRIAL BIOPSY      Tonya Wilkerson is a 50 y.o. G1P1001 here for endometrial biopsy.  The indications for endometrial biopsy were reviewed.  Risks of the biopsy including cramping, bleeding, infection, uterine perforation, inadequate specimen and need for additional procedures were discussed. The patient states she understands and agrees to undergo procedure today. Consent was signed. Time out was performed.   Indications: heavy menstrual bleeding Urine HCG: negative  A bivalve speculum was placed into the vagina and the cervix was easily visualized and was prepped with Betadine x2. A single-toothed tenaculum was placed on the anterior lip of the cervix to stabilize it. The 3 mm pipelle was introduced into the endometrial cavity without difficulty to a depth of 9 cm, and a moderate amount of tissue was obtained and sent to pathology. This was repeated for a total of 4 passes. The instruments were removed from the patient's vagina. Minimal bleeding from the cervix at the tenaculum was noted.   The patient tolerated the procedure well. Routine post-procedure instructions were given to the patient.    Will base further management on results of biopsy.  Feliz Beam, MD, [place] for [facility])

## 2020-04-27 ENCOUNTER — Other Ambulatory Visit: Payer: Self-pay

## 2020-04-27 ENCOUNTER — Encounter: Payer: Self-pay | Admitting: Nurse Practitioner

## 2020-04-27 ENCOUNTER — Ambulatory Visit: Payer: Medicaid Other | Attending: Nurse Practitioner | Admitting: Nurse Practitioner

## 2020-04-27 VITALS — BP 146/100 | HR 85 | Temp 98.9°F | Resp 16 | Wt 321.0 lb

## 2020-04-27 DIAGNOSIS — I10 Essential (primary) hypertension: Secondary | ICD-10-CM

## 2020-04-27 DIAGNOSIS — J4531 Mild persistent asthma with (acute) exacerbation: Secondary | ICD-10-CM | POA: Diagnosis not present

## 2020-04-27 DIAGNOSIS — E119 Type 2 diabetes mellitus without complications: Secondary | ICD-10-CM | POA: Diagnosis not present

## 2020-04-27 DIAGNOSIS — E785 Hyperlipidemia, unspecified: Secondary | ICD-10-CM

## 2020-04-27 DIAGNOSIS — J4520 Mild intermittent asthma, uncomplicated: Secondary | ICD-10-CM

## 2020-04-27 LAB — POCT GLYCOSYLATED HEMOGLOBIN (HGB A1C): HbA1c, POC (controlled diabetic range): 6 % (ref 0.0–7.0)

## 2020-04-27 LAB — GLUCOSE, POCT (MANUAL RESULT ENTRY): POC Glucose: 105 mg/dl — AB (ref 70–99)

## 2020-04-27 MED ORDER — AZITHROMYCIN 250 MG PO TABS: ORAL_TABLET | ORAL | 0 refills | Status: DC

## 2020-04-27 NOTE — Progress Notes (Signed)
Assessment & Plan:  Tonya Wilkerson was seen today for diabetes and hypertension.  Diagnoses and all orders for this visit:  Controlled type 2 diabetes mellitus without complication, without long-term current use of insulin (HCC) -     HgB A1c -     Glucose (CBG) -     CMP14+EGFR; Future -     Microalbumin / creatinine urine ratio; Future -     Ambulatory referral to Ophthalmology Continue blood sugar control as discussed in office today, low carbohydrate diet, and regular physical exercise as tolerated, 150 minutes per week (30 min each day, 5 days per week, or 50 min 3 days per week). Keep blood sugar logs with fasting goal of 90-130 mg/dl, post prandial (after you eat) less than 180.  For Hypoglycemia: BS <60 and Hyperglycemia BS >400; contact the clinic ASAP. Annual eye exams and foot exams are recommended.  Essential hypertension -     CMP14+EGFR; Future Continue all antihypertensives as prescribed.  Remember to bring in your blood pressure log with you for your follow up appointment.  DASH/Mediterranean Diets are healthier choices for HTN.    Mild persistent asthma with acute exacerbation -     azithromycin (ZITHROMAX) 250 MG tablet; Take 2 (two) tablets by mouth on day one. Take 1 (one) tablet by mouth on days 2-5.  Dyslipidemia, goal LDL below 70 -     Lipid panel; Future INSTRUCTIONS: Work on a low fat, heart healthy diet and participate in regular aerobic exercise program by working out at least 150 minutes per week; 5 days a week-30 minutes per day. Avoid red meat/beef/steak,  fried foods. junk foods, sodas, sugary drinks, unhealthy snacking, alcohol and smoking.  Drink at least 80 oz of water per day and monitor your carbohydrate intake daily.      Patient has been counseled on age-appropriate routine health concerns for screening and prevention. These are reviewed and up-to-date. Referrals have been placed accordingly. Immunizations are up-to-date or declined.    Subjective:    Chief Complaint  Patient presents with  . Diabetes  . Hypertension   [name] 50 y.o. female presents to office today for follow up. 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   Essential Hypertension Blood pressure is elevated. She is only taking lisinopril 40 mg daily at this time. We had a lengthy discussion regarding the need for her additional antihypertensives. She will be restarting lopressor 12.5 mg BID and imdur 30 mg daily. Denies chest pain, shortness of breath, palpitations, lightheadedness, dizziness, headaches or BLE edema.  BP Readings from Last 3 Encounters:  04/27/20 (!) 146/100  04/19/20 (!) 192/112  02/23/20 (!) 155/105    DM 2 Well controlled. Prescribed metformin 500 mg daily. Weight is down a few pounds since last visit. She is currently not taking a statin. Would like to hold off at this time. Denies any symptoms of hypo or hyperglycemia.  Lab Results  Component Value Date   HGBA1C 6.0 04/27/2020   The 10-year ASCVD risk score [name] DC Jr., et al., 2013) is: 6.9%   Values used to calculate the score:     Age: 64 years     Sex: Female     Is Non-Hispanic African American: Yes     Diabetic: Yes     Tobacco smoker: No     Systolic Blood Pressure: 364 mmHg     Is  BP treated: Yes     HDL Cholesterol: 78 mg/dL     Total Cholesterol: 160 mg/dL Lab Results  Component Value Date   HGBA1C 6.0 04/27/2020   Lab Results  Component Value Date   LDLCALC 71 08/30/2019   Asthma Symptoms have been somewhat controlled with Daily use of breo and prn use of albuterol. Today she notes increased sinus pressure, post nasal drip, mild cough, tonsillar lymphadenopathy and headache.    Review of Systems  Constitutional: Negative for fever, malaise/fatigue and weight loss.  HENT: Positive for congestion and sinus pain. Negative for  nosebleeds.   Eyes: Negative.  Negative for blurred vision, double vision and photophobia.  Respiratory: Positive for cough. Negative for shortness of breath.   Cardiovascular: Negative.  Negative for chest pain, palpitations and leg swelling.  Gastrointestinal: Negative.  Negative for heartburn, nausea and vomiting.  Musculoskeletal: Negative.  Negative for myalgias.  Neurological: Positive for headaches. Negative for dizziness, focal weakness and seizures.  Psychiatric/Behavioral: Negative.  Negative for suicidal ideas.    Past Medical History:  Diagnosis Date  . Anemia   . Asthma   . Hematuria   . Hyperbilirubinemia   . Hypertension   . Morbid obesity with BMI of 40.0-44.9, adult ([place])   . Pneumonia due to infectious organism     Past Surgical History:  Procedure Laterality Date  . TUBAL LIGATION      Family History  Problem Relation Age of Onset  . Hypertension Mother   . Stroke Mother   . Diabetes Maternal Aunt   . Cancer Maternal Grandfather     Social History Reviewed with no changes to be made today.   Outpatient Medications Prior to Visit  Medication Sig Dispense Refill  . azelastine (ASTELIN) 0.1 % nasal spray Place 2 sprays into both nostrils 2 (two) times daily. Use in each nostril as directed 30 mL 12  . busPIRone (BUSPAR) 15 MG tablet TAKE 1 TABLET(15 MG) BY MOUTH THREE TIMES DAILY 90 tablet 3  . ferrous sulfate 325 (65 FE) MG tablet Take 1 tablet (325 mg total) by mouth 2 (two) times daily with a meal. 180 tablet 0  . fluticasone (FLONASE) 50 MCG/ACT nasal spray Place 1 spray into both nostrils daily for 30 days. 16 g 2  . fluticasone furoate-vilanterol (BREO ELLIPTA) 100-25 MCG/INH AEPB Inhale 1 puff into the lungs daily. 60 each 3  . ipratropium-albuterol (DUONEB) 0.5-2.5 (3) MG/3ML SOLN Take 3 mLs by nebulization every 4 (four) hours as needed. 360 mL 0  . isosorbide mononitrate (IMDUR) 30 MG 24 hr tablet Take 30 mg by mouth daily.    Marland Kitchen lisinopril  (ZESTRIL) 40 MG tablet Take 1 tablet (40 mg total) by mouth daily. 90 tablet 1  . metFORMIN (GLUCOPHAGE) 500 MG tablet Take 1 tablet (500 mg total) by mouth daily with breakfast. 90 tablet 1  . montelukast (SINGULAIR) 10 MG tablet Take 1 tablet (10 mg total) by mouth at bedtime. 30 tablet 3  . tranexamic acid (LYSTEDA) 650 MG TABS tablet Take 2 tablets (1,300 mg total) by mouth 3 (three) times daily. Take during menses for a maximum of five days 30 tablet 2  . traZODone (DESYREL) 150 MG tablet TAKE 1 TABLET(150 MG) BY MOUTH AT BEDTIME 90 tablet 1  . amLODipine (NORVASC) 2.5 MG tablet Take by mouth.    Marland Kitchen aspirin 81 MG chewable tablet Chew by mouth.    . cyclobenzaprine (FLEXERIL) 5 MG tablet Take 1 tablet (5 mg total)  by mouth 3 (three) times daily as needed for muscle spasms. 30 tablet 1  . Accu-Chek FastClix Lancets MISC Use as instructed. Inject into the skin twice daily E11.65 200 each 6  . albuterol (VENTOLIN HFA) 108 (90 Base) MCG/ACT inhaler Inhale 2 puffs into the lungs every 6 (six) hours as needed for wheezing or shortness of breath. 8 g 2  . Blood Glucose Monitoring Suppl (ACCU-CHEK GUIDE) w/Device KIT 1 each by Does not apply route 2 (two) times daily. 1 kit 0  . glucose blood (ACCU-CHEK GUIDE) test strip Use as instructed. Inject into the skin twice daily E11.65 200 each 12  . metoprolol tartrate (LOPRESSOR) 25 MG tablet Take 12.5 mg by mouth 2 (two) times daily. (Patient not taking: Reported on 04/27/2020)    . atorvastatin (LIPITOR) 20 MG tablet Take by mouth. (Patient not taking: Reported on 04/27/2020)    . cetirizine (ZYRTEC) 10 MG tablet TAKE 1 TABLET(10 MG) BY MOUTH DAILY (Patient not taking: Reported on 04/27/2020) 90 tablet 2  . guaiFENesin-dextromethorphan (ROBITUSSIN DM) 100-10 MG/5ML syrup Take 10 mLs by mouth every 4 (four) hours as needed for cough. (Patient not taking: Reported on 04/27/2020) 240 mL 0   No facility-administered medications prior to visit.    No Known  Allergies     Objective:    BP (!) 146/100   Pulse 85   Temp 98.9 F (37.2 C) (Oral)   Resp 16   Wt (!) 321 lb (145.6 kg)   LMP 04/02/2020 (Approximate)   SpO2 98%   BMI 46.06 kg/m  Wt Readings from Last 3 Encounters:  04/27/20 (!) 321 lb (145.6 kg)  04/19/20 (!) 325 lb (147.4 kg)  02/23/20 (!) 325 lb (147.4 kg)    Physical Exam Vitals and nursing note reviewed.  Constitutional:      Appearance: She is well-developed and well-nourished.  HENT:     Head: Normocephalic and atraumatic.  Eyes:     Extraocular Movements: EOM normal.  Cardiovascular:     Rate and Rhythm: Normal rate and regular rhythm.     Pulses: Intact distal pulses.     Heart sounds: Normal heart sounds. No murmur heard. No friction rub. No gallop.   Pulmonary:     Effort: Pulmonary effort is normal. No tachypnea or respiratory distress.     Breath sounds: Normal breath sounds. No decreased breath sounds, wheezing, rhonchi or rales.  Chest:     Chest wall: No tenderness.  Abdominal:     General: Bowel sounds are normal.     Palpations: Abdomen is soft.  Musculoskeletal:        General: No edema. Normal range of motion.     Cervical back: Normal range of motion.  Lymphadenopathy:     Head:     Right side of head: Tonsillar adenopathy present.     Left side of head: Tonsillar adenopathy present.  Skin:    General: Skin is warm and dry.  Neurological:     Mental Status: She is alert and oriented to person, place, and time.     Coordination: Coordination normal.  Psychiatric:        Mood and Affect: Mood and affect normal.        Behavior: Behavior normal. Behavior is cooperative.        Thought Content: Thought content normal.        Judgment: Judgment normal.        Patient has been counseled extensively about nutrition and exercise  as well as the importance of adherence with medications and regular follow-up. The patient was given clear instructions to go to ER or return to medical center if  symptoms don't improve, worsen or new problems develop. The patient verbalized understanding.   Follow-up: Return in about 2 weeks (around 05/11/2020) for BP CHECK WITH [facility] IN 2 WEEKS and LABS. see me in  3 MONTHS.   Gildardo Pounds, FNP-BC [facility] [place], [place]   04/28/2020, 2:57 PM

## 2020-04-27 NOTE — Progress Notes (Signed)
F/u BP and DM Concerns about sinus pressure

## 2020-04-28 ENCOUNTER — Encounter: Payer: Self-pay | Admitting: Nurse Practitioner

## 2020-05-03 ENCOUNTER — Encounter: Payer: Self-pay | Admitting: Nurse Practitioner

## 2020-05-04 ENCOUNTER — Other Ambulatory Visit: Payer: Self-pay | Admitting: Nurse Practitioner

## 2020-05-04 MED ORDER — HYDROCODONE-HOMATROPINE 5-1.5 MG/5ML PO SYRP: 5.0000 mL | ORAL_SOLUTION | Freq: Four times a day (QID) | ORAL | 0 refills | Status: DC | PRN

## 2020-05-04 NOTE — Progress Notes (Signed)
hyco

## 2020-05-10 ENCOUNTER — Other Ambulatory Visit: Payer: Self-pay

## 2020-05-10 ENCOUNTER — Encounter: Payer: Self-pay | Admitting: Obstetrics and Gynecology

## 2020-05-10 ENCOUNTER — Ambulatory Visit: Payer: Medicaid Other | Admitting: Obstetrics and Gynecology

## 2020-05-10 VITALS — BP 169/96 | HR 82 | Resp 16 | Ht 70.0 in | Wt 318.0 lb

## 2020-05-10 DIAGNOSIS — Z7189 Other specified counseling: Secondary | ICD-10-CM

## 2020-05-10 DIAGNOSIS — N939 Abnormal uterine and vaginal bleeding, unspecified: Secondary | ICD-10-CM | POA: Diagnosis not present

## 2020-05-10 NOTE — Progress Notes (Signed)
GYNECOLOGY OFFICE FOLLOW UP NOTE  History:  50 y.o. G1P1001 here today for follow up for discussion of management for abnormal uterine bleeding. Negative endometrial biopsy, no issues after biopsy. She declines medical management and would like hysterectomy.    Past Medical History:  Diagnosis Date  . Anemia   . Asthma   . Hematuria   . Hyperbilirubinemia   . Hypertension   . Morbid obesity with BMI of 40.0-44.9, adult ([place])   . Pneumonia due to infectious organism     Past Surgical History:  Procedure Laterality Date  . TUBAL LIGATION       Current Outpatient Medications:  .  Accu-Chek FastClix Lancets MISC, Use as instructed. Inject into the skin twice daily E11.65, Disp: 200 each, Rfl: 6 .  albuterol (VENTOLIN HFA) 108 (90 Base) MCG/ACT inhaler, Inhale 2 puffs into the lungs every 6 (six) hours as needed for wheezing or shortness of breath., Disp: 8 g, Rfl: 2 .  azelastine (ASTELIN) 0.1 % nasal spray, Place 2 sprays into both nostrils 2 (two) times daily. Use in each nostril as directed, Disp: 30 mL, Rfl: 12 .  azithromycin (ZITHROMAX) 250 MG tablet, Take 2 (two) tablets by mouth on day one. Take 1 (one) tablet by mouth on days 2-5., Disp: 6 tablet, Rfl: 0 .  Blood Glucose Monitoring Suppl (ACCU-CHEK GUIDE) w/Device KIT, 1 each by Does not apply route 2 (two) times daily., Disp: 1 kit, Rfl: 0 .  busPIRone (BUSPAR) 15 MG tablet, TAKE 1 TABLET(15 MG) BY MOUTH THREE TIMES DAILY, Disp: 90 tablet, Rfl: 3 .  fluticasone furoate-vilanterol (BREO ELLIPTA) 100-25 MCG/INH AEPB, Inhale 1 puff into the lungs daily., Disp: 60 each, Rfl: 3 .  glucose blood (ACCU-CHEK GUIDE) test strip, Use as instructed. Inject into the skin twice daily E11.65, Disp: 200 each, Rfl: 12 .  HYDROcodone-homatropine (HYCODAN) 5-1.5 MG/5ML syrup, Take 5 mLs by mouth every 6 (six) hours as needed for cough., Disp: 240 mL, Rfl: 0 .  ipratropium-albuterol (DUONEB) 0.5-2.5 (3) MG/3ML SOLN, Take 3 mLs by nebulization  every 4 (four) hours as needed., Disp: 360 mL, Rfl: 0 .  isosorbide mononitrate (IMDUR) 30 MG 24 hr tablet, Take 30 mg by mouth daily., Disp: , Rfl:  .  metFORMIN (GLUCOPHAGE) 500 MG tablet, Take 1 tablet (500 mg total) by mouth daily with breakfast., Disp: 90 tablet, Rfl: 1 .  montelukast (SINGULAIR) 10 MG tablet, Take 1 tablet (10 mg total) by mouth at bedtime., Disp: 30 tablet, Rfl: 3 .  tranexamic acid (LYSTEDA) 650 MG TABS tablet, Take 2 tablets (1,300 mg total) by mouth 3 (three) times daily. Take during menses for a maximum of five days, Disp: 30 tablet, Rfl: 2 .  traZODone (DESYREL) 150 MG tablet, TAKE 1 TABLET(150 MG) BY MOUTH AT BEDTIME, Disp: 90 tablet, Rfl: 1 .  ferrous sulfate 325 (65 FE) MG tablet, Take 1 tablet (325 mg total) by mouth 2 (two) times daily with a meal., Disp: 180 tablet, Rfl: 0 .  fluticasone (FLONASE) 50 MCG/ACT nasal spray, Place 1 spray into both nostrils daily for 30 days., Disp: 16 g, Rfl: 2 .  lisinopril (ZESTRIL) 40 MG tablet, Take 1 tablet (40 mg total) by mouth daily., Disp: 90 tablet, Rfl: 1  The following portions of the patient's history were reviewed and updated as appropriate: allergies, current medications, past family history, past medical history, past social history, past surgical history and problem list.   Review of Systems:  Pertinent items noted  in HPI and remainder of comprehensive ROS otherwise negative.   Objective:  Physical Exam BP (!) 169/96   Pulse 82   Resp 16   Ht '5\' 10"'  (1.778 m)   Wt (!) 318 lb (144.2 kg)   BMI 45.63 kg/m  CONSTITUTIONAL: Well-developed, well-nourished female in no acute distress.  HENT:  Normocephalic, atraumatic. External right and left ear normal. Oropharynx is clear and moist EYES: Conjunctivae and EOM are normal. Pupils are equal, round, and reactive to light. No scleral icterus.  NECK: Normal range of motion, supple, no masses SKIN: Skin is warm and dry. No rash noted. Not diaphoretic. No erythema. No  pallor. NEUROLOGIC: Alert and oriented to person, place, and time. Normal reflexes, muscle tone coordination. No cranial nerve deficit noted. PSYCHIATRIC: Normal mood and affect. Normal behavior. Normal judgment and thought content. CARDIOVASCULAR: Normal heart rate noted RESPIRATORY: Effort normal, no problems with respiration noted ABDOMEN: Soft, no distention noted.   PELVIC: deferred MUSCULOSKELETAL: Normal range of motion. No edema noted.  Labs and Imaging No results found.  Assessment & Plan:  1. Abnormal uterine bleeding (AUB) - patient declines medical management - fibroids present, greatest is 5 cm in diameter - will send for consult with Dr. Ihor Dow for possible RA-TLH - briefly reviewed hysterectomy, expected course and recovery - she is in agreement with plan - reviewed she will need to have good control of HTN  2. Counseled about COVID-19 virus infection Encouraged patient to get vaccinated, reviewed risks/benefits -states she will likely get vaccinated next week when she sees PCP   Routine preventative health maintenance measures emphasized. Please refer to After Visit Summary for other counseling recommendations.   Return if symptoms worsen or fail to improve.  Total face-to-face time with patient: 23 minutes. Over 50% of encounter was spent on counseling and coordination of care.  Feliz Beam, MD, [place] for [facility])

## 2020-05-14 ENCOUNTER — Ambulatory Visit: Payer: Medicaid Other | Admitting: Pharmacist

## 2020-05-29 ENCOUNTER — Ambulatory Visit: Payer: Medicaid Other | Admitting: Pharmacist

## 2020-06-04 ENCOUNTER — Encounter: Payer: Self-pay | Admitting: *Deleted

## 2020-06-04 ENCOUNTER — Ambulatory Visit: Payer: Medicaid Other | Admitting: Obstetrics & Gynecology

## 2020-06-04 ENCOUNTER — Other Ambulatory Visit: Payer: Self-pay

## 2020-06-04 ENCOUNTER — Encounter: Payer: Self-pay | Admitting: Obstetrics & Gynecology

## 2020-06-04 VITALS — BP 116/75 | HR 85 | Wt 310.0 lb

## 2020-06-04 DIAGNOSIS — D250 Submucous leiomyoma of uterus: Secondary | ICD-10-CM | POA: Diagnosis not present

## 2020-06-04 DIAGNOSIS — N939 Abnormal uterine and vaginal bleeding, unspecified: Secondary | ICD-10-CM

## 2020-06-04 DIAGNOSIS — R102 Pelvic and perineal pain: Secondary | ICD-10-CM

## 2020-06-04 NOTE — Progress Notes (Signed)
History:  50 y.o. G1P1001 here today for AUB and pelvic pain. LMP Mar 1. Pt reports bleeding 10-12 days pre month. She reports pain with her menses that is severe and sometimes pain outside of her menses. Pt is s/p work up by Dr. Rosana Hoes. Pt declined conservative management and requests Hysterectomy via minimally invasive route.     The following portions of the patient's history were reviewed and updated as appropriate: allergies, current medications, past family history, past medical history, past social history, past surgical history and problem list.  Review of Systems:  Pertinent items are noted in HPI.    Objective:  Physical Exam Blood pressure 116/75, pulse 85, weight (!) 310 lb (140.6 kg), last menstrual period 05/15/2020.  CONSTITUTIONAL: Well-developed, well-nourished female in no acute distress.  HENT:  Normocephalic, atraumatic EYES: Conjunctivae and EOM are normal. No scleral icterus.  NECK: Normal range of motion SKIN: Skin is warm and dry. No rash noted. Not diaphoretic.No pallor. [place]: Alert and oriented to person, place, and time. Normal coordination.  Abd: Soft, nontender and nondistended Pelvic: Normal appearing external genitalia; normal appearing vaginal mucosa and cervix.  Normal discharge.  Uterus 11-12 weeks sized; no other palpable masses, no uterine or adnexal tenderness  Labs and Imaging 03/08/2020 CLINICAL DATA:  Abnormal uterine bleeding, LMP 02/26/2020  EXAM: TRANSABDOMINAL AND TRANSVAGINAL ULTRASOUND OF PELVIS  TECHNIQUE: Both transabdominal and transvaginal ultrasound examinations of the pelvis were performed. Transabdominal technique was performed for global imaging of the pelvis including uterus, ovaries, adnexal regions, and pelvic cul-de-sac. It was necessary to proceed with endovaginal exam following the transabdominal exam to visualize the endometrium and ovaries.  COMPARISON:  None  FINDINGS: Uterus  Measurements: 11.6 x 9.8 x  10.4 cm = volume: 620 mL. Anteverted. Heterogeneous myometrium. Submucosal leiomyoma at upper uterus posteriorly 5.4 x 3.8 x 5.2 cm. Additional anterior wall leiomyoma at upper uterus 3.0 x 3.2 x 3.9 cm, intramural. Anterior wall leiomyoma likely submucosal 3.7 x 3.2 x 4.2 cm. Nabothian cysts at cervix. Nodule is cervix containing scattered internal mild hypoechogenicity 15 x 10 x 12 mm, favor complicated nabothian cyst.  Endometrium  Thickness: 11 mm. Not well visualized due to presence of multiple uterine leiomyomata. No obvious endometrial fluid.  Right ovary  Measurements: 2.9 x 1.7 x 2.3 cm = volume: 6 mL. Normal morphology without mass  Left ovary  Measurements: 3.0 x 1.7 x 1.8 cm = volume: 5 mL. Normal morphology without mass  Other findings  No free pelvic fluid.  No adnexal masses.  IMPRESSION: Unremarkable ovaries and adnexa.  Enlarged uterus containing multiple leiomyomata, including a 5.4 cm diameter submucosal leiomyoma at the posterior upper uterus, and a 4.2 cm diameter submucosal leiomyoma at anterior upper uterus.  Suboptimally visualized endometrial complex 11 mm thick due to multiple uterine leiomyomata  Assessment & Plan:  AUB and Pelvic pain. Pt declines conservative options. She has already reviewed her options and would like to proceed with surgery.   Patient desires surgical management with [facility] with bilateral salpingectomy.  The risks of surgery were discussed in detail with the patient including but not limited to: bleeding which may require transfusion or reoperation; infection which may require prolonged hospitalization or re-hospitalization and antibiotic therapy; injury to bowel, bladder, ureters and major vessels or other surrounding organs; need for additional procedures including laparotomy; thromboembolic phenomenon, incisional problems and other postoperative or anesthesia complications.  Patient was told that the likelihood that  her condition and symptoms will be treated effectively with this  surgical management was very high; the postoperative expectations were also discussed in detail. The patient also understands the alternative treatment options which were discussed in full. All questions were answered.  She was told that she will be contacted by our surgical scheduler regarding the time and date of her surgery; routine preoperative instructions of having nothing to eat or drink after midnight on the day prior to surgery and also coming to the hospital 1 1/2 hours prior to her time of surgery were also emphasized.  She was told she may be called for a preoperative appointment about a week prior to surgery and will be given further preoperative instructions at that visit. Printed patient education handouts about the procedure were given to the patient to review at home.  Total face-to-face time with patient, review of chart, discussion with consultant and coordination of care was 47min.    Carolyn L. Harraway-Smith, M.D., [place]. Harraway-Smith, M.D., Cherlynn June

## 2020-06-04 NOTE — Patient Instructions (Signed)
Total Laparoscopic Hysterectomy A total laparoscopic hysterectomy is a minimally invasive surgery to remove the uterus and cervix. The fallopian tubes and ovaries can also be removed during this surgery, if necessary. This procedure may be done to treat problems such as:  Growths in the uterus (uterine fibroids) that are not cancer but cause symptoms.  A condition that causes the lining of the uterus to grow in other areas (endometriosis).  Problems with pelvic support.  Cancer of the cervix, ovaries, uterus, or tissue that lines the uterus (endometrium).  Excessive bleeding in the uterus. After this procedure, you will no longer be able to have a baby, and you will no longer have a menstrual period. Tell a health care provider about:  Any allergies you have.  All medicines you are taking, including vitamins, herbs, eye drops, creams, and over-the-counter medicines.  Any problems you or family members have had with anesthetic medicines.  Any blood disorders you have.  Any surgeries you have had.  Any medical conditions you have.  Whether you are pregnant or may be pregnant. What are the risks? Generally, this is a safe procedure. However, problems may occur, including:  Infection.  Bleeding.  Blood clots in the legs or lungs.  Allergic reactions to medicines.  Damage to nearby structures or organs.  Having to change from this surgery to one in which a large incision is made in the abdomen (abdominal hysterectomy). What happens before the procedure? Staying hydrated Follow instructions from your health care provider about hydration, which may include:  Up to 2 hours before the procedure - you may continue to drink clear liquids, such as water, clear fruit juice, black coffee, and plain tea.   Eating and drinking restrictions Follow instructions from your health care provider about eating and drinking, which may include:  8 hours before the procedure - stop eating  heavy meals or foods, such as meat, fried foods, or fatty foods.  6 hours before the procedure - stop eating light meals or foods, such as toast or cereal.  6 hours before the procedure - stop drinking milk or drinks that contain milk.  2 hours before the procedure - stop drinking clear liquids. Medicines  Ask your health care provider about: ? Changing or stopping your regular medicines. This is especially important if you are taking diabetes medicines or blood thinners. ? Taking medicines such as aspirin and ibuprofen. These medicines can thin your blood. Do not take these medicines unless your health care provider tells you to take them. ? Taking over-the-counter medicines, vitamins, herbs, and supplements.  You may be asked to take medicine that helps you have a bowel movement (laxative) to prevent constipation. General instructions  If you were asked to do bowel preparation before the procedure, follow instructions from your health care provider.  This procedure can affect the way you feel about yourself. Talk with your health care provider about the physical and emotional changes hysterectomy may cause.  Do not use any products that contain nicotine or tobacco for at least 4 weeks before the procedure. These products include cigarettes, chewing tobacco, and vaping devices, such as e-cigarettes. If you need help quitting, ask your health care provider.  Plan to have a responsible adult take you home from the hospital or clinic.  Plan to have a responsible adult care for you for the time you are told after you leave the hospital or clinic. This is important. Surgery safety Ask your health care provider:  How your surgery  site will be marked.  What steps will be taken to help prevent infection. These may include: ? Removing hair at the surgery site. ? Washing skin with a germ-killing soap. ? Receiving antibiotic medicine. What happens during the procedure?  An IV will be  inserted into one of your veins.  You will be given one or more of the following: ? A medicine to help you relax (sedative). ? A medicine to make you fall asleep (general anesthetic). ? A medicine to numb the area (local anesthetic). ? A medicine that is injected into your spine to numb the area below and slightly above the injection site (spinal anesthetic). ? A medicine that is injected into an area of your body to numb everything below the injection site (regional anesthetic).  A gas will be used to inflate your abdomen. This will allow your surgeon to look inside your abdomen and do the surgery.  Three or four small incisions will be made in your abdomen.  A small device with a light (laparoscope) will be inserted into one of your incisions. Surgical instruments will be inserted through the other incisions in order to perform the procedure.  Your uterus and cervix may be removed through your vagina or cut into small pieces and removed through the small incisions. Any other organs that need to be removed will also be removed this way.  The gas will be released from inside your abdomen.  Your incisions will be closed with stitches (sutures), skin glue, or adhesive strips.  A bandage (dressing) may be placed over your incisions. The procedure may vary among health care providers and hospitals. What happens after the procedure?  Your blood pressure, heart rate, breathing rate, and blood oxygen level will be monitored until you leave the hospital or clinic.  You will be given medicine for pain as needed.  You will be encouraged to walk as soon as possible. You will also use a device to help you breathe or do breathing exercises to keep your lungs clear.  You may have to wear compression stockings. These stockings help to prevent blood clots and reduce swelling in your legs.  You will need to wear a sanitary pad for vaginal discharge or bleeding. Summary  Total laparoscopic  hysterectomy is a procedure to remove your uterus, cervix, and sometimes the fallopian tubes and ovaries.  This procedure can affect the way you feel about yourself. Talk with your health care provider about the physical and emotional changes hysterectomy may cause.  After this procedure, you will no longer be able to have a baby, and you will no longer have a menstrual period.  You will be given pain medicine to control discomfort after this procedure.  Plan to have a responsible adult take you home from the hospital or clinic. This information is not intended to replace advice given to you by your health care provider. Make sure you discuss any questions you have with your health care provider. Document Revised: 11/04/2019 Document Reviewed: 11/04/2019 Elsevier Patient Education  [place]. Total Laparoscopic Hysterectomy, Care After The following information offers guidance on how to care for yourself after your procedure. Your health care provider may also give you more specific instructions. If you have problems or questions, contact your health care provider. What can I expect after the procedure? After the procedure, it is common to have:  Pain, bruising, and numbness around your incisions.  Tiredness (fatigue).  Poor appetite.  Less interest in sex.  Vaginal discharge  or bleeding. You will need to use a sanitary pad after this procedure.  Feelings of sadness or other emotions. If your ovaries were also removed, it is also common to have symptoms of menopause, such as hot flashes, night sweats, and lack of sleep (insomnia). Follow these instructions at home: Medicines  Take over-the-counter and prescription medicines only as told by your health care provider.  Ask your health care provider if the medicine prescribed to you: ? Requires you to avoid driving or using machinery. ? Can cause constipation. You may need to take these actions to prevent or treat  constipation:  Drink enough fluid to keep your urine pale yellow.  Take over-the-counter or prescription medicines.  Eat foods that are high in fiber, such as beans, whole grains, and fresh fruits and vegetables.  Limit foods that are high in fat and processed sugars, such as fried or sweet foods. Incision care  Follow instructions from your health care provider about how to take care of your incisions. Make sure you: ? Wash your hands with soap and water for at least 20 seconds before and after you change your bandage (dressing). If soap and water are not available, use hand sanitizer. ? Change your dressing as told by your health care provider. ? Leave stitches (sutures), skin glue, or adhesive strips in place. These skin closures may need to stay in place for 2 weeks or longer. If adhesive strip edges start to loosen and curl up, you may trim the loose edges. Do not remove adhesive strips completely unless your health care provider tells you to do that.  Check your incision areas every day for signs of infection. Check for: ? More redness, swelling, or pain. ? Fluid or blood. ? Warmth. ? Pus or a bad smell.   Activity  Rest as told by your health care provider.  Avoid sitting for a long time without moving. Get up to take short walks every 1-2 hours. This is important to improve blood flow and breathing. Ask for help if you feel weak or unsteady.  Return to your normal activities as told by your health care provider. Ask your health care provider what activities are safe for you.  Do not lift anything that is heavier than 10 lb (4.5 kg), or the limit that you are told, for one month after surgery or until your health care provider says that it is safe.  If you were given a sedative during the procedure, it can affect you for several hours. Do not drive or operate machinery until your health care provider says that it is safe.   Lifestyle  Do not use any products that contain  nicotine or tobacco. These products include cigarettes, chewing tobacco, and vaping devices, such as e-cigarettes. These can delay healing after surgery. If you need help quitting, ask your health care provider.  Do not drink alcohol until your health care provider approves. General instructions  Do not douche, use tampons, or have sex for at least 6 weeks, or as told by your health care provider.  If you struggle with physical or emotional changes after your procedure, speak with your health care provider or a therapist.  Do not take baths, swim, or use a hot tub until your health care provider approves. You may only be allowed to take showers for 2-3 weeks.  Keep your dressing dry until your health care provider says it can be removed.  Try to have someone at home with you for the  first 1-2 weeks to help with your daily chores.  Wear compression stockings as told by your health care provider. These stockings help to prevent blood clots and reduce swelling in your legs.  Keep all follow-up visits. This is important.   Contact a health care provider if:  You have any of these signs of infection: ? Chills or a fever. ? More redness, swelling, or pain around an incision. ? Fluid or blood coming from an incision. ? Warmth coming from an incision. ? Pus or a bad smell coming from an incision.  An incision opens.  You feel dizzy or light-headed.  You have pain or bleeding when you urinate, or you are unable to urinate.  You have abnormal vaginal discharge.  You have pain that does not get better with medicine. Get help right away if:  You have a fever and your symptoms suddenly get worse.  You have severe abdominal pain.  You have chest pain or shortness of breath.  You faint.  You have pain, swelling, or redness in your leg.  You have heavy vaginal bleeding with blood clots, soaking through a sanitary pad in less than 1 hour. These symptoms may represent a serious problem  that is an emergency. Do not wait to see if the symptoms will go away. Get medical help right away. Call your local emergency services (911 in the [place] Do not drive yourself to the hospital. Summary  After the procedure, it is common to have pain and bruising around your incisions.  Do not take baths, swim, or use a hot tub until your health care provider approves.  Do not lift anything that is heavier than 10 lb (4.5 kg), or the limit that you are told, for one month after surgery or until your health care provider says that it is safe.  Tell your health care provider if you have any signs or symptoms of infection after the procedure.  Get help right away if you have severe abdominal pain, chest pain, shortness of breath, or heavy bleeding from your vagina. This information is not intended to replace advice given to you by your health care provider. Make sure you discuss any questions you have with your health care provider. Document Revised: 11/04/2019 Document Reviewed: 11/04/2019 Elsevier Patient Education  [place].

## 2020-06-05 ENCOUNTER — Telehealth: Payer: Self-pay | Admitting: General Practice

## 2020-06-05 NOTE — Telephone Encounter (Signed)
-----   Message from Huey Romans, RN sent at 06/04/2020  1:12 PM EDT ----- Regarding: Need HYSTERECTOMY Statement for 07-17-20 Surgery 07-17-20

## 2020-06-05 NOTE — Telephone Encounter (Signed)
Left message on voice mail informing patient that she will need to sign consent form for surgery.  Asked patient to give our office a call.

## 2020-06-13 ENCOUNTER — Encounter: Payer: Self-pay | Admitting: Obstetrics & Gynecology

## 2020-06-19 NOTE — Progress Notes (Signed)
Pt came to sign hysterectomy consent. Consent scanned to [facility] and also sent hard copy.

## 2020-07-09 ENCOUNTER — Encounter (HOSPITAL_BASED_OUTPATIENT_CLINIC_OR_DEPARTMENT_OTHER): Payer: Self-pay | Admitting: Obstetrics & Gynecology

## 2020-07-11 ENCOUNTER — Encounter (HOSPITAL_BASED_OUTPATIENT_CLINIC_OR_DEPARTMENT_OTHER): Payer: Self-pay | Admitting: Obstetrics & Gynecology

## 2020-07-11 ENCOUNTER — Other Ambulatory Visit: Payer: Self-pay

## 2020-07-11 NOTE — Progress Notes (Signed)
Spoke w/ via phone for pre-op interview---pt Lab needs dos----  Urine preg             Lab results------has lab appt 07-13-2020 1115 am for cbcb bmp t & s and ekg COVID test ------07-13-2020 1230 Arrive at -------530 am 07-17-2020 NPO after MN NO Solid Food.  Clear liquids from MN until---430 am then npo Med rec completed Medications to take morning of surgery -----albuterol inhaler prn/bring inhaler, buspar prn, breo ellipta, use duo neb, isosorbide mononitrate Diabetic medication -----n/a Patient instructed to bring photo id and insurance card day of surgery Patient aware to have Driver (ride ) / caregiver best friend Milana Huntsman will stay    for 24 hours after surgery  Patient Special Instructions -----pt given extended stay instructions Pre-Op special Istructions -----none Patient verbalized understanding of instructions that were given at this phone interview. Patient denies shortness of breath, chest pain, fever, cough at this phone interview.

## 2020-07-11 NOTE — Progress Notes (Signed)
YOU ARE SCHEDULED FOR A COVID TEST 07-13-2020 @ 1230PM. THIS TEST MUST BE DONE BEFORE SURGERY. GO TO  [place] [place], [place], IT IS APPROXIMATELY 2 MINUTES PAST [employer] ON THE RIGHT AND REMAIN IN YOUR CAR, THIS IS A DRIVE UP TEST. ONCE YOUR COVID TEST IS DONE PLEASE FOLLOW ALL THE QUARANTINE  INSTRUCTIONS GIVEN IN YOUR HANDOUT.      Your procedure is scheduled on 07-17-2020  Report to [facility].   Call this number if you have problems the morning of surgery  :[phone].   OUR ADDRESS IS [address].  WE ARE LOCATED IN [facility].  PLEASE BRING YOUR INSURANCE CARD AND PHOTO ID DAY OF SURGERY.  ONLY ONE PERSON ALLOWED IN FACILITY WAITING AREA.                                     REMEMBER:  DO NOT EAT FOOD, CANDY GUM OR MINTS  AFTER MIDNIGHT . YOU MAY HAVE CLEAR LIQUIDS FROM MIDNIGHT UNTIL 430 AM. NO CLEAR LIQUIDS AFTER 430 AM DAY OF SURGERY.   YOU MAY  BRUSH YOUR TEETH MORNING OF SURGERY AND RINSE YOUR MOUTH OUT, NO CHEWING GUM CANDY OR MINTS.    CLEAR LIQUID DIET   Foods Allowed                                                                     Foods Excluded  Coffee and tea, regular and decaf                             liquids that you cannot  Plain Jell-O any favor except red or purple                                           see through such as: Fruit ices (not with fruit pulp)                                     milk, soups, orange juice  Iced Popsicles                                    All solid food Carbonated beverages, regular and diet                                    Cranberry, grape and apple juices Sports drinks like Gatorade Lightly seasoned clear broth or consume(fat free) Sugar, honey syrup  Sample Menu Breakfast                                Lunch  Supper Cranberry juice                    Beef broth                            Chicken  broth Jell-O                                     Grape juice                           Apple juice Coffee or tea                        Jell-O                                      Popsicle                                                Coffee or tea                        Coffee or tea  _____________________________________________________________________     TAKE THESE MEDICATIONS MORNING OF SURGERY WITH A SIP OF WATER:  ALBUTEROL INHALER IF NEEDED AND BRING ALBUTEROL INHALER, BUSPAR IF NEEDED, BREO ELLIPTA AND BRING BREO ELLIPTA INHALER WITH YOU, USE DUONEB MORNING OF SURGERY, ISOSORBIDE MONONITRATE.  ONE VISITOR IS ALLOWED IN WAITING ROOM ONLY DAY OF SURGERY.  NO VISITOR MAY SPEND THE NIGHT.  VISITOR ARE ALLOWED TO STAY UNTIL 800 PM.                                    DO NOT WEAR JEWERLY, MAKE UP. DO NOT WEAR LOTIONS, POWDERS, PERFUMES OR DEODORANT. DO NOT SHAVE FOR 24 HOURS PRIOR TO DAY OF SURGERY. MEN MAY SHAVE FACE AND NECK. CONTACTS, GLASSES, OR DENTURES MAY NOT BE WORN TO SURGERY.                                    [facility] IS NOT RESPONSIBLE  FOR ANY BELONGINGS.                                                                    Marland Kitchen           [facility] - Preparing for Surgery Before surgery, you can play an important role.  Because skin is not sterile, your skin needs to be as free of germs as possible.  You can reduce the number of germs on your skin by washing with CHG (chlorahexidine gluconate) soap before surgery.  CHG is an antiseptic cleaner which kills germs and bonds with the skin to continue  killing germs even after washing. Please DO NOT use if you have an allergy to CHG or antibacterial soaps.  If your skin becomes reddened/irritated stop using the CHG and inform your nurse when you arrive at Short Stay. Do not shave (including legs and underarms) for at least 48 hours prior to the first CHG shower.  You may shave your face/neck. Please follow these instructions  carefully:  1.  Shower with CHG Soap the night before surgery and the  morning of Surgery.  2.  If you choose to wash your hair, wash your hair first as usual with your  normal  shampoo.  3.  After you shampoo, rinse your hair and body thoroughly to remove the  shampoo.                            4.  Use CHG as you would any other liquid soap.  You can apply chg directly  to the skin and wash                      Gently with a scrungie or clean washcloth.  5.  Apply the CHG Soap to your body ONLY FROM THE NECK DOWN.   Do not use on face/ open                           Wound or open sores. Avoid contact with eyes, ears mouth and genitals (private parts).                       Wash face,  Genitals (private parts) with your normal soap.             6.  Wash thoroughly, paying special attention to the area where your surgery  will be performed.  7.  Thoroughly rinse your body with warm water from the neck down.  8.  DO NOT shower/wash with your normal soap after using and rinsing off  the CHG Soap.                9.  Pat yourself dry with a clean towel.            10.  Wear clean pajamas.            11.  Place clean sheets on your bed the night of your first shower and do not  sleep with pets. Day of Surgery : Do not apply any lotions/deodorants the morning of surgery.  Please wear clean clothes to the hospital/surgery center.  FAILURE TO FOLLOW THESE INSTRUCTIONS MAY RESULT IN THE CANCELLATION OF YOUR SURGERY PATIENT SIGNATURE_________________________________  NURSE SIGNATURE__________________________________  ________________________________________________________________________                                                        QUESTIONS Tonya Wilkerson PRE OP NURSE PHONE [phone]

## 2020-07-13 ENCOUNTER — Other Ambulatory Visit (HOSPITAL_COMMUNITY)
Admission: RE | Admit: 2020-07-13 | Discharge: 2020-07-13 | Disposition: A | Discharge status: Home / Self Care | Payer: Medicaid Other | Source: Ambulatory Visit | Attending: Obstetrics & Gynecology | Admitting: Obstetrics & Gynecology

## 2020-07-13 ENCOUNTER — Other Ambulatory Visit: Payer: Self-pay

## 2020-07-13 ENCOUNTER — Encounter (HOSPITAL_COMMUNITY)
Admission: RE | Admit: 2020-07-13 | Discharge: 2020-07-13 | Disposition: A | Discharge status: Home / Self Care | Payer: Medicaid Other | Source: Ambulatory Visit | Attending: Obstetrics & Gynecology | Admitting: Obstetrics & Gynecology

## 2020-07-13 DIAGNOSIS — Z01818 Encounter for other preprocedural examination: Secondary | ICD-10-CM | POA: Diagnosis not present

## 2020-07-13 DIAGNOSIS — Z20822 Contact with and (suspected) exposure to covid-19: Secondary | ICD-10-CM | POA: Insufficient documentation

## 2020-07-13 LAB — BASIC METABOLIC PANEL
Anion gap: 9 (ref 5–15)
BUN: 9 mg/dL (ref 6–20)
CO2: 24 mmol/L (ref 22–32)
Calcium: 9 mg/dL (ref 8.9–10.3)
Chloride: 104 mmol/L (ref 98–111)
Creatinine, Ser: 0.81 mg/dL (ref 0.44–1.00)
GFR, Estimated: >60 mL/min (ref >60)
Glucose, Bld: 119 mg/dL — ABNORMAL HIGH (ref 70–99)
Potassium: 4 mmol/L (ref 3.5–5.1)
Sodium: 137 mmol/L (ref 135–145)

## 2020-07-13 LAB — CBC
HCT: 31.8 % — ABNORMAL LOW (ref 36.0–46.0)
Hemoglobin: 8.4 g/dL — ABNORMAL LOW (ref 12.0–15.0)
MCH: 16.7 pg — ABNORMAL LOW (ref 26.0–34.0)
MCHC: 26.4 g/dL — ABNORMAL LOW (ref 30.0–36.0)
MCV: 63.1 fL — ABNORMAL LOW (ref 80.0–100.0)
Platelets: 616 10*3/uL — ABNORMAL HIGH (ref 150–400)
RBC: 5.04 MIL/uL (ref 3.87–5.11)
RDW: 22.7 % — ABNORMAL HIGH (ref 11.5–15.5)
WBC: 7.4 10*3/uL (ref 4.0–10.5)
nRBC: 0.5 % — ABNORMAL HIGH (ref 0.0–0.2)

## 2020-07-13 LAB — SARS CORONAVIRUS 2 (TAT 6-24 HRS): SARS Coronavirus 2: NEGATIVE

## 2020-07-13 NOTE — Progress Notes (Signed)
hemaglobin 8.4 on labs today, note sent to dr Ihor Dow epic inbasket, secure chat sent to sally yeakley and epic inbasket note sent to Jordan battle to make dr Ihor Dow aware of 8.4 hemaglobin on labs today.

## 2020-07-16 NOTE — Anesthesia Preprocedure Evaluation (Addendum)
Anesthesia Evaluation  Patient identified by MRN, date of birth, ID band Patient awake    Reviewed: Allergy & Precautions, NPO status , Patient's Chart, lab work & pertinent test results  History of Anesthesia Complications Negative for: history of anesthetic complications  Airway Mallampati: II  TM Distance: >3 FB Neck ROM: Full    Dental  (+) Missing, Chipped,    Pulmonary asthma , sleep apnea (noncompliant with CPAP) , former smoker,    Pulmonary exam normal        Cardiovascular hypertension, Pt. on medications Normal cardiovascular exam     Neuro/Psych Anxiety negative neurological ROS     GI/Hepatic negative GI ROS, Neg liver ROS,   Endo/Other  Morbid obesity (BMI 44)  Renal/GU negative Renal ROS  negative genitourinary   Musculoskeletal negative musculoskeletal ROS (+)   Abdominal   Peds  Hematology  (+) anemia , Hgb 8.4   Anesthesia Other Findings Day of surgery medications reviewed with patient.  Reproductive/Obstetrics AUB,Pelvic pain                            Anesthesia Physical Anesthesia Plan  ASA: III  Anesthesia Plan: General   Post-op Pain Management:    Induction: Intravenous  PONV Risk Score and Plan: 4 or greater and Treatment may vary due to age or medical condition, Ondansetron, Dexamethasone, Midazolam and Scopolamine patch - Pre-op  Airway Management Planned: Oral ETT  Additional Equipment: None  Intra-op Plan:   Post-operative Plan: Extubation in OR  Informed Consent: I have reviewed the patients History and Physical, chart, labs and discussed the procedure including the risks, benefits and alternatives for the proposed anesthesia with the patient or authorized representative who has indicated his/her understanding and acceptance.     Dental advisory given  Plan Discussed with: CRNA  Anesthesia Plan Comments:        Anesthesia Quick  Evaluation

## 2020-07-16 NOTE — Progress Notes (Signed)
Secure chat received from sally yearley, chs is aware of hemaglobin 8.4 on pre op labs and is ok with this.

## 2020-07-17 ENCOUNTER — Ambulatory Visit (HOSPITAL_BASED_OUTPATIENT_CLINIC_OR_DEPARTMENT_OTHER)
Admission: RE | Admit: 2020-07-17 | Discharge: 2020-07-17 | Disposition: A | Discharge status: Home / Self Care | Payer: Medicaid Other | Attending: Obstetrics & Gynecology | Admitting: Obstetrics & Gynecology

## 2020-07-17 ENCOUNTER — Ambulatory Visit (HOSPITAL_BASED_OUTPATIENT_CLINIC_OR_DEPARTMENT_OTHER): Payer: Medicaid Other | Admitting: Anesthesiology

## 2020-07-17 ENCOUNTER — Encounter (HOSPITAL_BASED_OUTPATIENT_CLINIC_OR_DEPARTMENT_OTHER)
Admission: RE | Disposition: A | Discharge status: Home / Self Care | Payer: Self-pay | Source: Home / Self Care | Attending: Obstetrics & Gynecology

## 2020-07-17 ENCOUNTER — Other Ambulatory Visit: Payer: Self-pay

## 2020-07-17 ENCOUNTER — Encounter (HOSPITAL_BASED_OUTPATIENT_CLINIC_OR_DEPARTMENT_OTHER): Payer: Self-pay | Admitting: Obstetrics & Gynecology

## 2020-07-17 DIAGNOSIS — N939 Abnormal uterine and vaginal bleeding, unspecified: Secondary | ICD-10-CM | POA: Insufficient documentation

## 2020-07-17 DIAGNOSIS — Z823 Family history of stroke: Secondary | ICD-10-CM | POA: Insufficient documentation

## 2020-07-17 DIAGNOSIS — Z79899 Other long term (current) drug therapy: Secondary | ICD-10-CM | POA: Diagnosis not present

## 2020-07-17 DIAGNOSIS — Z8249 Family history of ischemic heart disease and other diseases of the circulatory system: Secondary | ICD-10-CM | POA: Insufficient documentation

## 2020-07-17 DIAGNOSIS — Z791 Long term (current) use of non-steroidal anti-inflammatories (NSAID): Secondary | ICD-10-CM | POA: Diagnosis not present

## 2020-07-17 DIAGNOSIS — I10 Essential (primary) hypertension: Secondary | ICD-10-CM | POA: Insufficient documentation

## 2020-07-17 DIAGNOSIS — G8929 Other chronic pain: Secondary | ICD-10-CM | POA: Diagnosis not present

## 2020-07-17 DIAGNOSIS — Z8616 Personal history of COVID-19: Secondary | ICD-10-CM | POA: Diagnosis not present

## 2020-07-17 DIAGNOSIS — Z809 Family history of malignant neoplasm, unspecified: Secondary | ICD-10-CM | POA: Insufficient documentation

## 2020-07-17 DIAGNOSIS — Z833 Family history of diabetes mellitus: Secondary | ICD-10-CM | POA: Diagnosis not present

## 2020-07-17 DIAGNOSIS — Z7951 Long term (current) use of inhaled steroids: Secondary | ICD-10-CM | POA: Diagnosis not present

## 2020-07-17 DIAGNOSIS — Z87891 Personal history of nicotine dependence: Secondary | ICD-10-CM | POA: Insufficient documentation

## 2020-07-17 DIAGNOSIS — J45909 Unspecified asthma, uncomplicated: Secondary | ICD-10-CM | POA: Diagnosis not present

## 2020-07-17 DIAGNOSIS — Z9889 Other specified postprocedural states: Secondary | ICD-10-CM

## 2020-07-17 DIAGNOSIS — D259 Leiomyoma of uterus, unspecified: Secondary | ICD-10-CM | POA: Diagnosis not present

## 2020-07-17 DIAGNOSIS — N938 Other specified abnormal uterine and vaginal bleeding: Secondary | ICD-10-CM

## 2020-07-17 DIAGNOSIS — D250 Submucous leiomyoma of uterus: Secondary | ICD-10-CM | POA: Insufficient documentation

## 2020-07-17 DIAGNOSIS — N879 Dysplasia of cervix uteri, unspecified: Secondary | ICD-10-CM | POA: Diagnosis not present

## 2020-07-17 DIAGNOSIS — R102 Pelvic and perineal pain unspecified side: Secondary | ICD-10-CM

## 2020-07-17 DIAGNOSIS — Z7984 Long term (current) use of oral hypoglycemic drugs: Secondary | ICD-10-CM | POA: Insufficient documentation

## 2020-07-17 DIAGNOSIS — D500 Iron deficiency anemia secondary to blood loss (chronic): Secondary | ICD-10-CM | POA: Diagnosis not present

## 2020-07-17 DIAGNOSIS — N838 Other noninflammatory disorders of ovary, fallopian tube and broad ligament: Secondary | ICD-10-CM | POA: Diagnosis not present

## 2020-07-17 DIAGNOSIS — N800 Endometriosis of uterus: Secondary | ICD-10-CM | POA: Insufficient documentation

## 2020-07-17 HISTORY — DX: Sleep apnea, unspecified: G47.30

## 2020-07-17 HISTORY — DX: Personal history of other complications of pregnancy, childbirth and the puerperium: Z87.59

## 2020-07-17 HISTORY — DX: Abnormal uterine and vaginal bleeding, unspecified: N93.9

## 2020-07-17 HISTORY — DX: Presence of spectacles and contact lenses: Z97.3

## 2020-07-17 HISTORY — DX: Anxiety disorder, unspecified: F41.9

## 2020-07-17 HISTORY — DX: Personal history of other medical treatment: Z92.89

## 2020-07-17 HISTORY — PX: CYSTOSCOPY: SHX5120

## 2020-07-17 HISTORY — PX: ROBOTIC ASSISTED LAPAROSCOPIC HYSTERECTOMY AND SALPINGECTOMY: SHX6379

## 2020-07-17 HISTORY — DX: Dyspnea, unspecified: R06.00

## 2020-07-17 LAB — GLUCOSE, CAPILLARY: Glucose-Capillary: 175 mg/dL — ABNORMAL HIGH (ref 70–99)

## 2020-07-17 LAB — POCT PREGNANCY, URINE: Preg Test, Ur: NEGATIVE

## 2020-07-17 LAB — TYPE AND SCREEN
ABO/RH(D): A POS
Antibody Screen: NEGATIVE

## 2020-07-17 LAB — ABO/RH: ABO/RH(D): A POS

## 2020-07-17 SURGERY — XI ROBOTIC ASSISTED LAPAROSCOPIC HYSTERECTOMY AND SALPINGECTOMY
Anesthesia: General | Site: Urethra

## 2020-07-17 MED ORDER — LACTATED RINGERS IV SOLN
INTRAVENOUS | Status: DC
Start: 2020-07-17 — End: 2020-07-17

## 2020-07-17 MED ORDER — CEFAZOLIN SODIUM-DEXTROSE 2-4 GM/100ML-% IV SOLN
2.0000 g | INTRAVENOUS | Status: AC
  Administered 2020-07-17: 2 g via INTRAVENOUS

## 2020-07-17 MED ORDER — ROCURONIUM BROMIDE 10 MG/ML (PF) SYRINGE
PREFILLED_SYRINGE | INTRAVENOUS | Status: AC
  Filled 2020-07-17: qty 10

## 2020-07-17 MED ORDER — FENTANYL CITRATE (PF) 100 MCG/2ML IJ SOLN
INTRAMUSCULAR | Status: AC
  Filled 2020-07-17: qty 2

## 2020-07-17 MED ORDER — OXYCODONE-ACETAMINOPHEN 5-325 MG PO TABS
1.0000 | ORAL_TABLET | ORAL | Status: DC | PRN
  Administered 2020-07-17: 1 via ORAL
  Administered 2020-07-17: 2 via ORAL

## 2020-07-17 MED ORDER — SOD CITRATE-CITRIC ACID 500-334 MG/5ML PO SOLN
30.0000 mL | ORAL | Status: DC
Start: 2020-07-17 — End: 2020-07-17

## 2020-07-17 MED ORDER — LIDOCAINE 2% (20 MG/ML) 5 ML SYRINGE
INTRAMUSCULAR | Status: AC
  Filled 2020-07-17: qty 10

## 2020-07-17 MED ORDER — PROMETHAZINE HCL 25 MG/ML IJ SOLN: 6.2500 mg | INTRAMUSCULAR | Status: DC | PRN

## 2020-07-17 MED ORDER — KETAMINE HCL 50 MG/5ML IJ SOSY
PREFILLED_SYRINGE | INTRAMUSCULAR | Status: AC
  Filled 2020-07-17: qty 5

## 2020-07-17 MED ORDER — GABAPENTIN 100 MG PO CAPS
100.0000 mg | ORAL_CAPSULE | ORAL | Status: AC
  Administered 2020-07-17: 100 mg via ORAL

## 2020-07-17 MED ORDER — ISOSORBIDE MONONITRATE ER 30 MG PO TB24
30.0000 mg | ORAL_TABLET | Freq: Every day | ORAL | Status: DC
  Administered 2020-07-17: 30 mg via ORAL
  Filled 2020-07-17: qty 1

## 2020-07-17 MED ORDER — SUGAMMADEX SODIUM 200 MG/2ML IV SOLN
INTRAVENOUS | Status: DC | PRN
  Administered 2020-07-17: 400 mg via INTRAVENOUS

## 2020-07-17 MED ORDER — LIDOCAINE 20MG/ML (2%) 15 ML SYRINGE OPTIME: INTRAMUSCULAR | Status: DC | PRN

## 2020-07-17 MED ORDER — INDIGOTINDISULFONATE SODIUM 8 MG/ML IJ SOLN
INTRAMUSCULAR | Status: DC | PRN
  Administered 2020-07-17: 5 mL via INTRAVENOUS

## 2020-07-17 MED ORDER — OXYCODONE HCL 5 MG/5ML PO SOLN
5.0000 mg | Freq: Once | ORAL | Status: DC | PRN
Start: 2020-07-17 — End: 2020-07-17

## 2020-07-17 MED ORDER — LIDOCAINE 2% (20 MG/ML) 5 ML SYRINGE
INTRAMUSCULAR | Status: AC
  Filled 2020-07-17: qty 20

## 2020-07-17 MED ORDER — DOCUSATE SODIUM 100 MG PO CAPS: 100.0000 mg | ORAL_CAPSULE | Freq: Two times a day (BID) | ORAL | 0 refills | Status: DC

## 2020-07-17 MED ORDER — POLYETHYLENE GLYCOL 3350 17 G PO PACK: 17.0000 g | PACK | Freq: Every day | ORAL | Status: DC | PRN

## 2020-07-17 MED ORDER — FENTANYL CITRATE (PF) 250 MCG/5ML IJ SOLN
INTRAMUSCULAR | Status: AC
  Filled 2020-07-17: qty 5

## 2020-07-17 MED ORDER — SCOPOLAMINE 1 MG/3DAYS TD PT72
1.0000 | MEDICATED_PATCH | Freq: Once | TRANSDERMAL | Status: DC
  Administered 2020-07-17: 1.5 mg via TRANSDERMAL

## 2020-07-17 MED ORDER — KETAMINE HCL 10 MG/ML IJ SOLN
INTRAMUSCULAR | Status: DC | PRN
  Administered 2020-07-17: 40 mg via INTRAVENOUS
  Administered 2020-07-17 (×2): 10 mg via INTRAVENOUS

## 2020-07-17 MED ORDER — LIDOCAINE 2% (20 MG/ML) 5 ML SYRINGE
INTRAMUSCULAR | Status: DC | PRN
  Administered 2020-07-17: 100 mg via INTRAVENOUS

## 2020-07-17 MED ORDER — IBUPROFEN 200 MG PO TABS
600.0000 mg | ORAL_TABLET | Freq: Four times a day (QID) | ORAL | Status: DC
Start: 2020-07-18 — End: 2020-07-18

## 2020-07-17 MED ORDER — MIDAZOLAM HCL 2 MG/2ML IJ SOLN
INTRAMUSCULAR | Status: AC
  Filled 2020-07-17: qty 2

## 2020-07-17 MED ORDER — OXYCODONE-ACETAMINOPHEN 5-325 MG PO TABS
ORAL_TABLET | ORAL | Status: AC
  Filled 2020-07-17: qty 1

## 2020-07-17 MED ORDER — HYDROMORPHONE HCL 1 MG/ML IJ SOLN: 0.2000 mg | INTRAMUSCULAR | Status: DC | PRN

## 2020-07-17 MED ORDER — KETOROLAC TROMETHAMINE 30 MG/ML IJ SOLN
INTRAMUSCULAR | Status: DC | PRN
  Administered 2020-07-17: 30 mg via INTRAVENOUS

## 2020-07-17 MED ORDER — ZOLPIDEM TARTRATE 5 MG PO TABS: 5.0000 mg | ORAL_TABLET | Freq: Every evening | ORAL | Status: DC | PRN

## 2020-07-17 MED ORDER — GABAPENTIN 100 MG PO CAPS
ORAL_CAPSULE | ORAL | Status: AC
  Filled 2020-07-17: qty 1

## 2020-07-17 MED ORDER — PROPOFOL 10 MG/ML IV BOLUS
INTRAVENOUS | Status: AC
  Filled 2020-07-17: qty 40

## 2020-07-17 MED ORDER — LISINOPRIL 20 MG PO TABS
40.0000 mg | ORAL_TABLET | Freq: Every day | ORAL | Status: DC
  Administered 2020-07-17: 40 mg via ORAL
  Filled 2020-07-17: qty 2

## 2020-07-17 MED ORDER — ONDANSETRON HCL 4 MG/2ML IJ SOLN: 4.0000 mg | Freq: Four times a day (QID) | INTRAMUSCULAR | Status: DC | PRN

## 2020-07-17 MED ORDER — POTASSIUM CHLORIDE IN NACL 20-0.45 MEQ/L-% IV SOLN
INTRAVENOUS | Status: DC
  Filled 2020-07-17 (×2): qty 1000

## 2020-07-17 MED ORDER — PANTOPRAZOLE SODIUM 40 MG PO TBEC
40.0000 mg | DELAYED_RELEASE_TABLET | Freq: Every day | ORAL | Status: DC
  Administered 2020-07-17: 40 mg via ORAL

## 2020-07-17 MED ORDER — ONDANSETRON HCL 4 MG/2ML IJ SOLN
INTRAMUSCULAR | Status: DC | PRN
  Administered 2020-07-17 (×2): 4 mg via INTRAVENOUS

## 2020-07-17 MED ORDER — FENTANYL CITRATE (PF) 100 MCG/2ML IJ SOLN
25.0000 ug | INTRAMUSCULAR | Status: DC | PRN
  Administered 2020-07-17: 50 ug via INTRAVENOUS
  Administered 2020-07-17: 25 ug via INTRAVENOUS

## 2020-07-17 MED ORDER — ONDANSETRON HCL 4 MG/2ML IJ SOLN
INTRAMUSCULAR | Status: AC
  Filled 2020-07-17: qty 4

## 2020-07-17 MED ORDER — OXYCODONE-ACETAMINOPHEN 5-325 MG PO TABS
ORAL_TABLET | ORAL | Status: AC
  Filled 2020-07-17: qty 2

## 2020-07-17 MED ORDER — MENTHOL 3 MG MT LOZG: 1.0000 | LOZENGE | OROMUCOSAL | Status: DC | PRN

## 2020-07-17 MED ORDER — OXYCODONE-ACETAMINOPHEN 5-325 MG PO TABS: 1.0000 | ORAL_TABLET | Freq: Four times a day (QID) | ORAL | 0 refills | Status: DC | PRN

## 2020-07-17 MED ORDER — CEFAZOLIN SODIUM 1 G IJ SOLR
INTRAMUSCULAR | Status: AC
  Filled 2020-07-17: qty 10

## 2020-07-17 MED ORDER — BISACODYL 10 MG RE SUPP: 10.0000 mg | Freq: Every day | RECTAL | Status: DC | PRN

## 2020-07-17 MED ORDER — ALBUTEROL SULFATE HFA 108 (90 BASE) MCG/ACT IN AERS: 2.0000 | INHALATION_SPRAY | Freq: Four times a day (QID) | RESPIRATORY_TRACT | Status: DC | PRN

## 2020-07-17 MED ORDER — KETOROLAC TROMETHAMINE 30 MG/ML IJ SOLN
INTRAMUSCULAR | Status: AC
  Filled 2020-07-17: qty 1

## 2020-07-17 MED ORDER — LACTATED RINGERS IV SOLN
INTRAVENOUS | Status: DC
  Administered 2020-07-17: 1000 mL via INTRAVENOUS

## 2020-07-17 MED ORDER — DEXAMETHASONE SODIUM PHOSPHATE 10 MG/ML IJ SOLN
INTRAMUSCULAR | Status: DC | PRN
  Administered 2020-07-17: 10 mg via INTRAVENOUS

## 2020-07-17 MED ORDER — IBUPROFEN 600 MG PO TABS
600.0000 mg | ORAL_TABLET | Freq: Four times a day (QID) | ORAL | 1 refills | Status: AC
Start: 2020-07-18 — End: ?

## 2020-07-17 MED ORDER — PROPOFOL 10 MG/ML IV BOLUS
INTRAVENOUS | Status: DC | PRN
  Administered 2020-07-17: 20 mg via INTRAVENOUS
  Administered 2020-07-17: 180 mg via INTRAVENOUS

## 2020-07-17 MED ORDER — PANTOPRAZOLE SODIUM 40 MG PO TBEC
DELAYED_RELEASE_TABLET | ORAL | Status: AC
  Filled 2020-07-17: qty 1

## 2020-07-17 MED ORDER — POVIDONE-IODINE 10 % EX SWAB
2.0000 | Freq: Once | CUTANEOUS | Status: AC
Start: 2020-07-17 — End: 2020-07-17
  Administered 2020-07-17: 2 via TOPICAL

## 2020-07-17 MED ORDER — BUPIVACAINE HCL (PF) 0.5 % IJ SOLN
INTRAMUSCULAR | Status: DC | PRN
  Administered 2020-07-17: 30 mL

## 2020-07-17 MED ORDER — ONDANSETRON HCL 4 MG PO TABS
4.0000 mg | ORAL_TABLET | Freq: Four times a day (QID) | ORAL | Status: DC | PRN
Start: 2020-07-17 — End: 2020-07-18

## 2020-07-17 MED ORDER — ROCURONIUM BROMIDE 10 MG/ML (PF) SYRINGE
PREFILLED_SYRINGE | INTRAVENOUS | Status: DC | PRN
  Administered 2020-07-17: 70 mg via INTRAVENOUS
  Administered 2020-07-17: 30 mg via INTRAVENOUS

## 2020-07-17 MED ORDER — CEFAZOLIN SODIUM-DEXTROSE 2-4 GM/100ML-% IV SOLN
INTRAVENOUS | Status: AC
  Filled 2020-07-17: qty 100

## 2020-07-17 MED ORDER — DOCUSATE SODIUM 100 MG PO CAPS
100.0000 mg | ORAL_CAPSULE | Freq: Two times a day (BID) | ORAL | Status: DC
Start: 2020-07-17 — End: 2020-07-18
  Administered 2020-07-17: 100 mg via ORAL

## 2020-07-17 MED ORDER — ARTIFICIAL TEARS OPHTHALMIC OINT
TOPICAL_OINTMENT | OPHTHALMIC | Status: AC
  Filled 2020-07-17: qty 3.5

## 2020-07-17 MED ORDER — DEXAMETHASONE SODIUM PHOSPHATE 10 MG/ML IJ SOLN
INTRAMUSCULAR | Status: AC
  Filled 2020-07-17: qty 1

## 2020-07-17 MED ORDER — SIMETHICONE 80 MG PO CHEW: 80.0000 mg | CHEWABLE_TABLET | Freq: Four times a day (QID) | ORAL | Status: DC | PRN

## 2020-07-17 MED ORDER — DOCUSATE SODIUM 100 MG PO CAPS
ORAL_CAPSULE | ORAL | Status: AC
  Filled 2020-07-17: qty 1

## 2020-07-17 MED ORDER — CEFAZOLIN SODIUM-DEXTROSE 1-4 GM/50ML-% IV SOLN
INTRAVENOUS | Status: DC | PRN
  Administered 2020-07-17: 1 g via INTRAVENOUS

## 2020-07-17 MED ORDER — SCOPOLAMINE 1 MG/3DAYS TD PT72
MEDICATED_PATCH | TRANSDERMAL | Status: AC
  Filled 2020-07-17: qty 1

## 2020-07-17 MED ORDER — ACETAMINOPHEN 500 MG PO TABS
ORAL_TABLET | ORAL | Status: AC
  Filled 2020-07-17: qty 2

## 2020-07-17 MED ORDER — MIDAZOLAM HCL 2 MG/2ML IJ SOLN
INTRAMUSCULAR | Status: DC | PRN
  Administered 2020-07-17: .5 mg via INTRAVENOUS
  Administered 2020-07-17: 2 mg via INTRAVENOUS
  Administered 2020-07-17: .5 mg via INTRAVENOUS

## 2020-07-17 MED ORDER — OXYCODONE HCL 5 MG PO TABS: 5.0000 mg | ORAL_TABLET | Freq: Once | ORAL | Status: DC | PRN

## 2020-07-17 MED ORDER — WHITE PETROLATUM EX OINT
TOPICAL_OINTMENT | CUTANEOUS | Status: AC
  Filled 2020-07-17: qty 5

## 2020-07-17 MED ORDER — FENTANYL CITRATE (PF) 100 MCG/2ML IJ SOLN
INTRAMUSCULAR | Status: DC | PRN
  Administered 2020-07-17: 50 ug via INTRAVENOUS
  Administered 2020-07-17: 100 ug via INTRAVENOUS
  Administered 2020-07-17 (×4): 50 ug via INTRAVENOUS

## 2020-07-17 MED ORDER — LIDOCAINE 2% (20 MG/ML) 5 ML SYRINGE
INTRAMUSCULAR | Status: DC | PRN
  Administered 2020-07-17: 1.5 mg/kg/h via INTRAVENOUS

## 2020-07-17 MED ORDER — ACETAMINOPHEN 500 MG PO TABS
1000.0000 mg | ORAL_TABLET | ORAL | Status: AC
  Administered 2020-07-17: 1000 mg via ORAL

## 2020-07-17 MED ORDER — KETOROLAC TROMETHAMINE 30 MG/ML IJ SOLN
30.0000 mg | Freq: Four times a day (QID) | INTRAMUSCULAR | Status: DC
Start: 2020-07-17 — End: 2020-07-18
  Administered 2020-07-17: 30 mg via INTRAVENOUS

## 2020-07-17 SURGICAL SUPPLY — 59 items
ADH SKN CLS APL DERMABOND .7 (GAUZE/BANDAGES/DRESSINGS) ×2
APL SRG 38 LTWT LNG FL B (MISCELLANEOUS) ×2
APPLICATOR ARISTA FLEXITIP XL (MISCELLANEOUS) ×3 IMPLANT
CATH FOLEY 3WAY  5CC 16FR (CATHETERS) ×1
CATH FOLEY 3WAY 5CC 16FR (CATHETERS) ×2 IMPLANT
COVER BACK TABLE 60X90IN (DRAPES) ×3 IMPLANT
COVER TIP SHEARS 8 DVNC (MISCELLANEOUS) ×2 IMPLANT
COVER TIP SHEARS 8MM DA VINCI (MISCELLANEOUS) ×1
COVER WAND RF STERILE (DRAPES) ×3 IMPLANT
DECANTER SPIKE VIAL GLASS SM (MISCELLANEOUS) ×3 IMPLANT
DEFOGGER SCOPE WARMER CLEARIFY (MISCELLANEOUS) ×3 IMPLANT
DERMABOND ADVANCED (GAUZE/BANDAGES/DRESSINGS) ×1
DERMABOND ADVANCED .7 DNX12 (GAUZE/BANDAGES/DRESSINGS) ×2 IMPLANT
DRAPE ARM DVNC X/XI (DISPOSABLE) ×6 IMPLANT
DRAPE COLUMN DVNC XI (DISPOSABLE) ×2 IMPLANT
DRAPE DA VINCI XI ARM (DISPOSABLE) ×3
DRAPE DA VINCI XI COLUMN (DISPOSABLE) ×1
DRAPE UTILITY XL STRL (DRAPES) ×3 IMPLANT
DURAPREP 26ML APPLICATOR (WOUND CARE) ×6 IMPLANT
ELECT REM PT RETURN 9FT ADLT (ELECTROSURGICAL) ×3
ELECTRODE REM PT RTRN 9FT ADLT (ELECTROSURGICAL) ×2 IMPLANT
GLOVE SURG ENC MOIS LTX SZ7 (GLOVE) ×9 IMPLANT
GLOVE SURG ENC TEXT LTX SZ6.5 (GLOVE) ×6 IMPLANT
GLOVE SURG UNDER POLY LF SZ7 (GLOVE) ×18 IMPLANT
HEMOSTAT ARISTA ABSORB 3G PWDR (HEMOSTASIS) ×3 IMPLANT
HOLDER FOLEY CATH W/STRAP (MISCELLANEOUS) ×3 IMPLANT
IRRIG SUCT STRYKERFLOW 2 WTIP (MISCELLANEOUS) ×3
IRRIGATION SUCT STRKRFLW 2 WTP (MISCELLANEOUS) ×2 IMPLANT
IV NS 1000ML (IV SOLUTION) ×6
IV NS 1000ML BAXH (IV SOLUTION) ×4 IMPLANT
KIT TURNOVER CYSTO (KITS) ×3 IMPLANT
LEGGING LITHOTOMY PAIR STRL (DRAPES) ×3 IMPLANT
MANIFOLD NEPTUNE II (INSTRUMENTS) ×3 IMPLANT
OBTURATOR OPTICAL STANDARD 8MM (TROCAR) ×1
OBTURATOR OPTICAL STND 8 DVNC (TROCAR) ×2
OBTURATOR OPTICALSTD 8 DVNC (TROCAR) ×2 IMPLANT
OCCLUDER COLPOPNEUMO (BALLOONS) ×3 IMPLANT
PACK ROBOT WH (CUSTOM PROCEDURE TRAY) ×3 IMPLANT
PACK ROBOTIC GOWN (GOWN DISPOSABLE) ×3 IMPLANT
PACK TRENDGUARD 600 HYBRD PROC (MISCELLANEOUS) ×2 IMPLANT
PAD OB MATERNITY 4.3X12.25 (PERSONAL CARE ITEMS) ×3 IMPLANT
PAD PREP 24X48 CUFFED NSTRL (MISCELLANEOUS) ×3 IMPLANT
PROTECTOR NERVE ULNAR (MISCELLANEOUS) ×6 IMPLANT
SEAL CANN UNIV 5-8 DVNC XI (MISCELLANEOUS) ×6 IMPLANT
SEAL XI 5MM-8MM UNIVERSAL (MISCELLANEOUS) ×3
SEALER VESSEL DA VINCI XI (MISCELLANEOUS) ×1
SEALER VESSEL EXT DVNC XI (MISCELLANEOUS) ×2 IMPLANT
SET IRRIG Y TYPE TUR BLADDER L (SET/KITS/TRAYS/PACK) ×3 IMPLANT
SET TRI-LUMEN FLTR TB AIRSEAL (TUBING) ×3 IMPLANT
SUT VIC AB 0 CT1 27 (SUTURE) ×6
SUT VIC AB 0 CT1 27XBRD ANBCTR (SUTURE) ×4 IMPLANT
SUT VIC AB 4-0 PS2 18 (SUTURE) ×9 IMPLANT
SUT VLOC 180 0 9IN  GS21 (SUTURE) ×1
SUT VLOC 180 0 9IN GS21 (SUTURE) ×2 IMPLANT
TIP UTERINE 6.7X8CM BLUE DISP (MISCELLANEOUS) ×3 IMPLANT
TOWEL OR 17X26 10 PK STRL BLUE (TOWEL DISPOSABLE) ×3 IMPLANT
TRENDGUARD 600 HYBRID PROC PK (MISCELLANEOUS) ×3
TROCAR PORT AIRSEAL 5X120 (TROCAR) ×3 IMPLANT
WATER STERILE IRR 1000ML POUR (IV SOLUTION) ×3 IMPLANT

## 2020-07-17 NOTE — Discharge Summary (Signed)
Physician Discharge Summary  Patient ID: Tonya Wilkerson MRN: [id number] DOB/AGE: [birth year] 50 y.o.  Admit date: 07/17/2020 Discharge date: 07/17/2020  Admission Diagnoses: AUB and pelvic pain  Discharge Diagnoses:  Principal Problem:   Abnormal uterine bleeding Active Problems:   Chronic pelvic pain in female   Post-operative state   Discharged Condition: good  Hospital Course: Patient had an uncomplicated surgery; for further details of this surgery, please refer to the operative note. Furthermore, the patient had an uncomplicated postoperative course.  By time of discharge, her pain was controlled on oral pain medications; she was ambulating, voiding without difficulty, tolerating regular diet and passing flatus.  She was deemed stable for discharge to home.    Consults: None  Significant Diagnostic Studies: labs: CBC  Treatments: surgery: Robot assisted Total laparoscopic hysterectomy with bilateral salpingectomy.    Discharge Exam: Blood pressure (!) 115/50, pulse 87, temperature (!) 97.5 F (36.4 C), temperature source Oral, resp. rate 16, height '6\' 1"'  (1.854 m), weight (!) 140.9 kg, last menstrual period 06/15/2020, SpO2 95 %. General appearance: alert and no distress  Disposition: Discharge disposition: 01-Home or Self Care       Discharge Instructions    Call MD for:  difficulty breathing, headache or visual disturbances   Complete by: As directed    Call MD for:  extreme fatigue   Complete by: As directed    Call MD for:  hives   Complete by: As directed    Call MD for:  persistant dizziness or light-headedness   Complete by: As directed    Call MD for:  persistant nausea and vomiting   Complete by: As directed    Call MD for:  redness, tenderness, or signs of infection (pain, swelling, redness, odor or green/yellow discharge around incision site)   Complete by: As directed    Call MD for:  severe uncontrolled pain   Complete by: As directed    Call MD for:   temperature >100.4   Complete by: As directed    Diet - low sodium heart healthy   Complete by: As directed    Diet Carb Modified   Complete by: As directed    Discharge wound care:   Complete by: As directed    Keep port sites clean and dry.   Driving Restrictions   Complete by: As directed    No driving for 2 weeks   Increase activity slowly   Complete by: As directed    Sexual Activity Restrictions   Complete by: As directed    No sexual activity for 8 weeks. Nothing in vagina for 8 weeks.     Allergies as of 07/17/2020   No Known Allergies     Medication List    TAKE these medications   Accu-Chek Guide test strip Generic drug: glucose blood Use as instructed. Inject into the skin twice daily E11.65   Accu-Chek Guide w/Device Kit 1 each by Does not apply route 2 (two) times daily.   albuterol 108 (90 Base) MCG/ACT inhaler Commonly known as: VENTOLIN HFA Inhale 2 puffs into the lungs every 6 (six) hours as needed for wheezing or shortness of breath.   azelastine 0.1 % nasal spray Commonly known as: ASTELIN Place 2 sprays into both nostrils 2 (two) times daily. Use in each nostril as directed What changed:   when to take this  reasons to take this   Breo Ellipta 100-25 MCG/INH Aepb Generic drug: fluticasone furoate-vilanterol Inhale 1 puff into the lungs daily.  busPIRone 15 MG tablet Commonly known as: BUSPAR TAKE 1 TABLET(15 MG) BY MOUTH THREE TIMES DAILY What changed:   when to take this  reasons to take this  additional instructions   docusate sodium 100 MG capsule Commonly known as: COLACE Take 1 capsule (100 mg total) by mouth 2 (two) times daily.   ferrous sulfate 325 (65 FE) MG tablet Take 1 tablet (325 mg total) by mouth 2 (two) times daily with a meal.   ibuprofen 600 MG tablet Commonly known as: ADVIL Take 1 tablet (600 mg total) by mouth every 6 (six) hours. Start taking on: Jul 18, 2020   ipratropium-albuterol 0.5-2.5 (3) MG/3ML  Soln Commonly known as: DUONEB Take 3 mLs by nebulization every 4 (four) hours as needed.   isosorbide mononitrate 30 MG 24 hr tablet Commonly known as: IMDUR Take 30 mg by mouth daily.   lisinopril 40 MG tablet Commonly known as: ZESTRIL Take 1 tablet (40 mg total) by mouth daily.   metFORMIN 500 MG tablet Commonly known as: GLUCOPHAGE Take 1 tablet (500 mg total) by mouth daily with breakfast.   oxyCODONE-acetaminophen 5-325 MG tablet Commonly known as: PERCOCET/ROXICET Take 1 tablet by mouth every 6 (six) hours as needed for moderate pain.   traZODone 150 MG tablet Commonly known as: DESYREL TAKE 1 TABLET(150 MG) BY MOUTH AT BEDTIME What changed: See the new instructions.            Discharge Care Instructions  (From admission, onward)         Start     Ordered   07/17/20 0000  Discharge wound care:       Comments: Keep port sites clean and dry.   07/17/20 1303          [place] In 2 weeks.   Specialty: Obstetrics and Gynecology Contact information: [address] [place] [address] [phone]              Signed: Lavonia Drafts 07/17/2020, 1:03 PM

## 2020-07-17 NOTE — Anesthesia Postprocedure Evaluation (Signed)
Anesthesia Post Note  Patient: Tonya Wilkerson  Procedure(s) Performed: XI ROBOTIC ASSISTED TOTAL HYSTERECTOMY WITH BILATERAL SALPINGECTOMY (Bilateral Abdomen) CYSTOSCOPY (N/A Urethra)     Patient location during evaluation: PACU Anesthesia Type: General Level of consciousness: awake and alert and oriented Pain management: pain level controlled Vital Signs Assessment: post-procedure vital signs reviewed and stable Respiratory status: spontaneous breathing, nonlabored ventilation and respiratory function stable Cardiovascular status: blood pressure returned to baseline Postop Assessment: no apparent nausea or vomiting Anesthetic complications: no   No complications documented.  Last Vitals:  Vitals:   07/17/20 1115 07/17/20 1135  BP: (!) 155/105 (!) 170/123  Pulse: 72 71  Resp: 16 18  Temp: 36.6 C 36.4 C  SpO2: 100% 98%    Last Pain:  Vitals:   07/17/20 1135  TempSrc: Oral  PainSc:                  Brennan Bailey

## 2020-07-17 NOTE — Transfer of Care (Signed)
Immediate Anesthesia Transfer of Care Note  Patient: Tonya Wilkerson  Procedure(s) Performed: Procedure(s) (LRB): XI ROBOTIC ASSISTED TOTAL HYSTERECTOMY WITH BILATERAL SALPINGECTOMY (Bilateral) CYSTOSCOPY (N/A)  Patient Location: PACU  Anesthesia Type: General  Level of Consciousness: awake, alert  and oriented  Airway & Oxygen Therapy: Patient Spontanous Breathing and Patient connected to face mask oxygen  Post-op Assessment: Report given to PACU RN and Post -op Vital signs reviewed and stable  Post vital signs: Reviewed and stable  Complications: No apparent anesthesia complications  Last Vitals:  Vitals Value Taken Time  BP 159/138 07/17/20 1046  Temp    Pulse 79 07/17/20 1052  Resp 10 07/17/20 1052  SpO2 100 % 07/17/20 1052  Vitals shown include unvalidated device data.  Last Pain:  Vitals:   07/17/20 0605  TempSrc: Oral  PainSc: 10-Worst pain ever      Patients Stated Pain Goal: 10 (25/05/39 7673)  Complications: No complications documented.

## 2020-07-17 NOTE — Brief Op Note (Signed)
07/17/2020  10:16 AM  PATIENT:  Tonya Wilkerson  50 y.o. female  PRE-OPERATIVE DIAGNOSIS:  AUB,Pelvic pain  POST-OPERATIVE DIAGNOSIS:  AUB,Pelvic pain  PROCEDURE:  Procedure(s): XI ROBOTIC ASSISTED TOTAL HYSTERECTOMY WITH BILATERAL SALPINGECTOMY (Bilateral) CYSTOSCOPY (N/A)  SURGEON:  Surgeon(s) and Role:    * Lavonia Drafts, MD - Primary    * Megan Salon, MD - Assisting  ANESTHESIA:   general  EBL:  50 mL   BLOOD ADMINISTERED:none  DRAINS: none   LOCAL MEDICATIONS USED:  MARCAINE     SPECIMEN:  Source of Specimen:  uterus, cervix and fallopian tubes.   DISPOSITION OF SPECIMEN:  PATHOLOGY  COUNTS:  YES  TOURNIQUET:  * No tourniquets in log *  DICTATION: .Note written in [place]: prolonged observation and then discharge later today.   PATIENT DISPOSITION:  PACU - hemodynamically stable.   Delay start of Pharmacological VTE agent (>24hrs) due to surgical blood loss or risk of bleeding: not applicable  Complications: none immediate  Necola Bluestein L. Harraway-Smith, M.D., Cherlynn June

## 2020-07-17 NOTE — Anesthesia Procedure Notes (Signed)
Procedure Name: Intubation Date/Time: 07/17/2020 7:51 AM Performed by: Mechele Claude, CRNA Pre-anesthesia Checklist: Patient identified, Emergency Drugs available, Suction available and Patient being monitored Patient Re-evaluated:Patient Re-evaluated prior to induction Oxygen Delivery Method: Circle system utilized Preoxygenation: Pre-oxygenation with 100% oxygen Induction Type: IV induction and Cricoid Pressure applied Ventilation: Mask ventilation without difficulty Laryngoscope Size: Mac and 4 Grade View: Grade II Tube type: Oral Tube size: 7.0 mm Number of attempts: 2 Airway Equipment and Method: Stylet and Oral airway Placement Confirmation: ETT inserted through vocal cords under direct vision,  positive ETCO2 and breath sounds checked- equal and bilateral Secured at: 22 cm Tube secured with: Tape Dental Injury: Teeth and Oropharynx as per pre-operative assessment  Comments: First attempt at DL unable to visualize cords.  Repositioned head without tren-guard headrest. Second DL, grade 2 view with cricoid pressure.  AOI.

## 2020-07-17 NOTE — Discharge Instructions (Signed)
Total Laparoscopic Hysterectomy, Care After The following information offers guidance on how to care for yourself after your procedure. Your health care provider may also give you more specific instructions. If you have problems or questions, contact your health care provider. What can I expect after the procedure? After the procedure, it is common to have:  Pain, bruising, and numbness around your incisions.  Tiredness (fatigue).  Poor appetite.  Less interest in sex.  Vaginal discharge or bleeding. You will need to use a sanitary pad after this procedure.  Feelings of sadness or other emotions. If your ovaries were also removed, it is also common to have symptoms of menopause, such as hot flashes, night sweats, and lack of sleep (insomnia). Follow these instructions at home: Medicines  Take over-the-counter and prescription medicines only as told by your health care provider.  Ask your health care provider if the medicine prescribed to you: ? Requires you to avoid driving or using machinery. ? Can cause constipation. You may need to take these actions to prevent or treat constipation:  Drink enough fluid to keep your urine pale yellow.  Take over-the-counter or prescription medicines.  Eat foods that are high in fiber, such as beans, whole grains, and fresh fruits and vegetables.  Limit foods that are high in fat and processed sugars, such as fried or sweet foods. Incision care  Follow instructions from your health care provider about how to take care of your incisions. Make sure you: ? Wash your hands with soap and water for at least 20 seconds before and after you change your bandage (dressing). If soap and water are not available, use hand sanitizer. ? Change your dressing as told by your health care provider. ? Leave stitches (sutures), skin glue, or adhesive strips in place. These skin closures may need to stay in place for 2 weeks or longer. If adhesive strip edges start  to loosen and curl up, you may trim the loose edges. Do not remove adhesive strips completely unless your health care provider tells you to do that.  Check your incision areas every day for signs of infection. Check for: ? More redness, swelling, or pain. ? Fluid or blood. ? Warmth. ? Pus or a bad smell.   Activity  Rest as told by your health care provider.  Avoid sitting for a long time without moving. Get up to take short walks every 1-2 hours. This is important to improve blood flow and breathing. Ask for help if you feel weak or unsteady.  Return to your normal activities as told by your health care provider. Ask your health care provider what activities are safe for you.  Do not lift anything that is heavier than 10 lb (4.5 kg), or the limit that you are told, for one month after surgery or until your health care provider says that it is safe.  If you were given a sedative during the procedure, it can affect you for several hours. Do not drive or operate machinery until your health care provider says that it is safe.   Lifestyle  Do not use any products that contain nicotine or tobacco. These products include cigarettes, chewing tobacco, and vaping devices, such as e-cigarettes. These can delay healing after surgery. If you need help quitting, ask your health care provider.  Do not drink alcohol until your health care provider approves. General instructions  Do not douche, use tampons, or have sex for at least 6 weeks, or as told by your health   care provider.  If you struggle with physical or emotional changes after your procedure, speak with your health care provider or a therapist.  Do not take baths, swim, or use a hot tub until your health care provider approves. You may only be allowed to take showers for 2-3 weeks.  Keep your dressing dry until your health care provider says it can be removed.  Try to have someone at home with you for the first 1-2 weeks to help with your  daily chores.  Wear compression stockings as told by your health care provider. These stockings help to prevent blood clots and reduce swelling in your legs.  Keep all follow-up visits. This is important.   Contact a health care provider if:  You have any of these signs of infection: ? Chills or a fever. ? More redness, swelling, or pain around an incision. ? Fluid or blood coming from an incision. ? Warmth coming from an incision. ? Pus or a bad smell coming from an incision.  An incision opens.  You feel dizzy or light-headed.  You have pain or bleeding when you urinate, or you are unable to urinate.  You have abnormal vaginal discharge.  You have pain that does not get better with medicine. Get help right away if:  You have a fever and your symptoms suddenly get worse.  You have severe abdominal pain.  You have chest pain or shortness of breath.  You faint.  You have pain, swelling, or redness in your leg.  You have heavy vaginal bleeding with blood clots, soaking through a sanitary pad in less than 1 hour. These symptoms may represent a serious problem that is an emergency. Do not wait to see if the symptoms will go away. Get medical help right away. Call your local emergency services (911 in the [place] Do not drive yourself to the hospital. Summary  After the procedure, it is common to have pain and bruising around your incisions.  Do not take baths, swim, or use a hot tub until your health care provider approves.  Do not lift anything that is heavier than 10 lb (4.5 kg), or the limit that you are told, for one month after surgery or until your health care provider says that it is safe.  Tell your health care provider if you have any signs or symptoms of infection after the procedure.  Get help right away if you have severe abdominal pain, chest pain, shortness of breath, or heavy bleeding from your vagina. This information is not intended to replace advice given  to you by your health care provider. Make sure you discuss any questions you have with your health care provider. Document Revised: 11/04/2019 Document Reviewed: 11/04/2019 Elsevier Patient Education  2021 [employer]  

## 2020-07-17 NOTE — H&P (Addendum)
Preoperative History and Physical  Tonya Wilkerson is a 50 y.o. G1P1001 here for surgical management of pelvic pain and abnormal uterine bleeding.   Pt has had bleeding daily for the last month.    Proposed surgery: Robot assisted total laparoscopic hysterectomy with bilateral salpingectomy   Past Medical History:  Diagnosis Date  . Abnormal uterine bleeding (AUB)   . Anemia   . Anxiety   . Asthma   . COVID 03/2020   mild cold symptoms x 3 days all symptoms resolved  . Dyspnea    with exertion  . History of blood transfusion yrs ago   after childbirth  . History of pre-eclampsia 17 yrs ago  . Hypertension   . Morbid obesity with BMI of 40.0-44.9, adult ([place])   . Pneumonia due to infectious organism 2020  . Sleep apnea dx 2021   did not tolerate cpap mild osa   . Wears glasses    Past Surgical History:  Procedure Laterality Date  . TUBAL LIGATION  17 yrs ago   OB History    Gravida  1   Para  1   Term  1   Preterm      AB      Living  1     SAB      IAB      Ectopic      Multiple      Live Births  1          Patient denies any cervical dysplasia or STIs. Medications Prior to Admission  Medication Sig Dispense Refill Last Dose  . albuterol (VENTOLIN HFA) 108 (90 Base) MCG/ACT inhaler Inhale 2 puffs into the lungs every 6 (six) hours as needed for wheezing or shortness of breath. 8 g 2 07/16/2020 at Unknown time  . busPIRone (BUSPAR) 15 MG tablet TAKE 1 TABLET(15 MG) BY MOUTH THREE TIMES DAILY (Patient taking differently: 3 (three) times daily as needed.) 90 tablet 3 07/16/2020 at Unknown time  . ferrous sulfate 325 (65 FE) MG tablet Take 1 tablet (325 mg total) by mouth 2 (two) times daily with a meal. 180 tablet 0 07/16/2020 at Unknown time  . fluticasone furoate-vilanterol (BREO ELLIPTA) 100-25 MCG/INH AEPB Inhale 1 puff into the lungs daily. 60 each 3 07/16/2020 at Unknown time  . isosorbide mononitrate (IMDUR) 30 MG 24 hr tablet Take 30 mg by mouth daily.    07/16/2020 at Unknown time  . lisinopril (ZESTRIL) 40 MG tablet Take 1 tablet (40 mg total) by mouth daily. 90 tablet 1 07/16/2020 at Unknown time  . metFORMIN (GLUCOPHAGE) 500 MG tablet Take 1 tablet (500 mg total) by mouth daily with breakfast. 90 tablet 1 07/16/2020 at Unknown time  . azelastine (ASTELIN) 0.1 % nasal spray Place 2 sprays into both nostrils 2 (two) times daily. Use in each nostril as directed (Patient taking differently: Place 2 sprays into both nostrils 2 (two) times daily as needed. Use in each nostril as directed) 30 mL 12 More than a month at Unknown time  . Blood Glucose Monitoring Suppl (ACCU-CHEK GUIDE) w/Device KIT 1 each by Does not apply route 2 (two) times daily. 1 kit 0 More than a month at Unknown time  . glucose blood (ACCU-CHEK GUIDE) test strip Use as instructed. Inject into the skin twice daily E11.65 200 each 12 More than a month at Unknown time  . ipratropium-albuterol (DUONEB) 0.5-2.5 (3) MG/3ML SOLN Take 3 mLs by nebulization every 4 (four) hours as needed. 360 mL  0   . tranexamic acid (LYSTEDA) 650 MG TABS tablet Take 2 tablets (1,300 mg total) by mouth 3 (three) times daily. Take during menses for a maximum of five days (Patient taking differently: Take 1,300 mg by mouth 3 (three) times daily as needed. Take during menses for a maximum of five days) 30 tablet 2 More than a month at Unknown time  . traZODone (DESYREL) 150 MG tablet TAKE 1 TABLET(150 MG) BY MOUTH AT BEDTIME (Patient taking differently: at bedtime as needed.) 90 tablet 1     No Known Allergies Social History:   reports that she has quit smoking. Her smoking use included cigarettes. She has never used smokeless tobacco. She reports current alcohol use. She reports that she does not use drugs. Family History  Problem Relation Age of Onset  . Hypertension Mother   . Stroke Mother   . Diabetes Maternal Aunt   . Cancer Maternal Grandfather     Review of Systems: Noncontributory  PHYSICAL EXAM: Blood  pressure (!) 169/109, pulse 88, temperature 98.1 F (36.7 C), temperature source Oral, resp. rate 16, height _0  (1.854 m), weight (!) 140.9 kg, last menstrual period 06/15/2020, SpO2 98 %. General appearance - alert, well appearing, and in no distress Chest - clear to auscultation, no wheezes, rales or rhonchi, symmetric air entry Heart - normal rate and regular rhythm Abdomen - soft, nontender, nondistended, no masses or organomegaly Pelvic - Normal appearing external genitalia; normal appearing vaginal mucosa and cervix.  Normal discharge.  Uterus 11-12 weeks sized; no other palpable masses, no uterine or adnexal tendernessExtremities - peripheral pulses normal, no pedal edema, no clubbing or cyanosis  Labs: Results for orders placed or performed during the hospital encounter of 07/17/20 (from the past 336 hour(s))  Pregnancy, urine POC   Collection Time: 07/17/20  5:37 AM  Result Value Ref Range   Preg Test, Ur NEGATIVE NEGATIVE  ABO/Rh   Collection Time: 07/17/20  6:21 AM  Result Value Ref Range   ABO/RH(D) PENDING   Results for orders placed or performed during the hospital encounter of 07/13/20 (from the past 336 hour(s))  SARS CORONAVIRUS 2 (TAT 6-24 HRS) Nasopharyngeal Nasopharyngeal Swab   Collection Time: 07/13/20 12:11 PM   Specimen: Nasopharyngeal Swab  Result Value Ref Range   SARS Coronavirus 2 NEGATIVE NEGATIVE  Results for orders placed or performed during the hospital encounter of 07/13/20 (from the past 336 hour(s))  Basic metabolic panel per protocol   Collection Time: 07/13/20 11:24 AM  Result Value Ref Range   Sodium 137 135 - 145 mmol/L   Potassium 4.0 3.5 - 5.1 mmol/L   Chloride 104 98 - 111 mmol/L   CO2 24 22 - 32 mmol/L   Glucose, Bld 119 (H) 70 - 99 mg/dL   BUN 9 6 - 20 mg/dL   Creatinine, Ser 0.81 0.44 - 1.00 mg/dL   Calcium 9.0 8.9 - 10.3 mg/dL   GFR, Estimated >60 >60 mL/min   Anion gap 9 5 - 15  CBC   Collection Time: 07/13/20 11:24 AM  Result  Value Ref Range   WBC 7.4 4.0 - 10.5 K/uL   RBC 5.04 3.87 - 5.11 MIL/uL   Hemoglobin 8.4 (L) 12.0 - 15.0 g/dL   HCT 31.8 (L) 36.0 - 46.0 %   MCV 63.1 (L) 80.0 - 100.0 fL   MCH 16.7 (L) 26.0 - 34.0 pg   MCHC 26.4 (L) 30.0 - 36.0 g/dL   RDW 22.7 (H) 11.5 -  15.5 %   Platelets 616 (H) 150 - 400 K/uL   nRBC 0.5 (H) 0.0 - 0.2 %  Type and screen [place]   Collection Time: 07/13/20 11:24 AM  Result Value Ref Range   ABO/RH(D) A POS    Antibody Screen NEG    Sample Expiration 07/27/2020,2359    Extend sample reason      NO TRANSFUSIONS OR PREGNANCY IN THE PAST 3 MONTHS Performed at [facility], [place] [address], [place], [place] [address]     Imaging Studies: 06/05/2019 03/08/2020 CLINICAL DATA: Abnormal uterine bleeding, LMP 02/26/2020  EXAM: TRANSABDOMINAL AND TRANSVAGINAL ULTRASOUND OF PELVIS  TECHNIQUE: Both transabdominal and transvaginal ultrasound examinations of the pelvis were performed. Transabdominal technique was performed for global imaging of the pelvis including uterus, ovaries, adnexal regions, and pelvic cul-de-sac. It was necessary to proceed with endovaginal exam following the transabdominal exam to visualize the endometrium and ovaries.  COMPARISON: None  FINDINGS: Uterus  Measurements: 11.6 x 9.8 x 10.4 cm = volume: 620 mL. Anteverted. Heterogeneous myometrium. Submucosal leiomyoma at upper uterus posteriorly 5.4 x 3.8 x 5.2 cm. Additional anterior wall leiomyoma at upper uterus 3.0 x 3.2 x 3.9 cm, intramural. Anterior wall leiomyoma likely submucosal 3.7 x 3.2 x 4.2 cm. Nabothian cysts at cervix. Nodule is cervix containing scattered internal mild hypoechogenicity 15 x 10 x 12 mm, favor complicated nabothian cyst.  Endometrium  Thickness: 11 mm. Not well visualized due to presence of multiple uterine leiomyomata. No obvious endometrial fluid.  Right ovary  Measurements: 2.9 x 1.7 x 2.3 cm = volume:  6 mL. Normal morphology without mass  Left ovary  Measurements: 3.0 x 1.7 x 1.8 cm = volume: 5 mL. Normal morphology without mass  Other findings  No free pelvic fluid. No adnexal masses.  IMPRESSION: Unremarkable ovaries and adnexa.  Enlarged uterus containing multiple leiomyomata, including a 5.4 cm diameter submucosal leiomyoma at the posterior upper uterus, and a 4.2 cm diameter submucosal leiomyoma at anterior upper uterus.  Suboptimally visualized endometrial complex 11 mm thick due to multiple uterine leiomyomata Assessment: Patient Active Problem List   Diagnosis Date Noted  . Abnormal uterine bleeding 07/17/2020  . Adjustment disorder with mixed anxiety and depressed mood 02/02/2018  . Essential hypertension 04/21/2017  . Other insomnia 04/21/2017  . Dysfunctional uterine bleeding 04/21/2017  . Iron deficiency anemia due to chronic blood loss 04/22/2016    Plan: Patient will undergo surgical management with Robot assisted total laparoscopic hysterectomy with bilateral salpingectomy. The risks of surgery were discussed in detail with the patient including but not limited to: bleeding which may require transfusion or reoperation; infection which may require antibiotics; injury to surrounding organs which may involve bowel, bladder, ureters ; need for additional procedures including laparoscopy or laparotomy; thromboembolic phenomenon, surgical site problems and other postoperative/anesthesia complications. Likelihood of success in alleviating the patient's condition was discussed. Routine postoperative instructions will be reviewed with the patient and her family in detail after surgery.  The patient concurred with the proposed plan, giving informed written consent for the surgery.  Patient has been NPO since last night she will remain NPO for procedure.  Anesthesia and OR aware.  Preoperative prophylactic antibiotics and SCDs ordered on call to the OR.  To OR when  ready.  Laurali Goddard L. Ihor Dow, M.D., [facility] 07/17/2020 7:21 AM

## 2020-07-17 NOTE — Op Note (Signed)
07/17/2020  10:16 AM  PATIENT:  Tonya Wilkerson  50 y.o. female  PRE-OPERATIVE DIAGNOSIS:  AUB,Pelvic pain  POST-OPERATIVE DIAGNOSIS:  AUB,Pelvic pain  PROCEDURE:  Procedure(s): XI ROBOTIC ASSISTED TOTAL HYSTERECTOMY WITH BILATERAL SALPINGECTOMY (Bilateral) CYSTOSCOPY (N/A)  SURGEON:  Surgeon(s) and Role:    * Lavonia Drafts, MD - Primary    * Megan Salon, MD - Assisting  ANESTHESIA:   general  EBL:  50 mL   BLOOD ADMINISTERED:none  DRAINS: none   LOCAL MEDICATIONS USED:  MARCAINE     SPECIMEN:  Source of Specimen:  uterus, cervix and fallopian tubes.   DISPOSITION OF SPECIMEN:  PATHOLOGY  COUNTS:  YES  TOURNIQUET:  * No tourniquets in log *  DICTATION: .Note written in [place]: prolonged observation and then discharge later today.   PATIENT DISPOSITION:  PACU - hemodynamically stable.   Delay start of Pharmacological VTE agent (>24hrs) due to surgical blood loss or risk of bleeding: not applicable  Complications: none immediate  Indications: 50 yo female with abnormal uterine bleeding and pelvic pain.   Findings: there was a large piece of omentum adherent to the anterior abd wall. The fallopian tubes and ovaries were within normal limits.   The risks, benefits, and alternatives of surgery were explained, understood, and accepted. Consents were signed. All questions were answered. She was taken to the operating room and general anesthesia was applied without complication. She was placed in the dorsal lithotomy position and her abdomen and vagina were prepped and draped after she had been carefully positioned on the table. A bimanual exam revealed a 11 week size uterus that was mobile. Her adnexa were not enlarged. The cervix was measured and the uterus was sounded to 9cm. A Rumi uterine manipulator was placed without difficulty. A Foley catheter was placed and it drained clear throughout the case. Gloves were changed and attention was turned to the  abdomen. An 35mm incision was made in the left upper quadrant and the laparoscope was placed through the Optiview trocar and the abd was insufflated using 4 L of CO2.. After good pneumoperitoneum was established, the above findings were noted. She was placed in Trendelenburg position.  A 5 mm trocar was placed in the left low quadrant lateral to the umbilicus.Using the curved shears, the omental adhesion was released from the upper abd. Good hemostasis was noted. A trocar was placed 5 cm above the umbilicus under direct visualization. And the right lower quadrant port was placed in the right lower quadrant to allow maximum exposure during the robotic case. The ports were all placed under direct laparoscopic visualization. The robot was docked and I proceeded with a robotic portion of the case.  The pelvis was inspected and the uterus was found to have fibroids and be enlarged.  The fallopian tubes and ovaries were found to be normal. The remainder of her pelvis appeared normal with the exception of adhesions of bowel to the adnexa and sidewall on the left side.  These were released sharply. The ureters and the infundibulopelvic ligaments were identified. I excised the fallopian tubes bilaterally. The round ligament on each side was cauterized and cut. The Vessel Sealer instrument was used for this portion. The round ligaments were identified, cauterized and ligated, a bladder flap was created anteriorly. The uterine vessels were identified and cauterized and then cut.The bladder was pushed out of the operative site and an anterior colpotomy was made. The colpotomy incision was extended circumferentially, following the blue outline  of the [employer]. All pedicles were hemostatic.  The uterus was too large to be removed intact so I proceeded to morcellate the uterus from below. The uterus was then removed from the vagina with the fallopian tube segments. At this point I performed cystoscopy. The cystoscopy  revealed blue ejection from both ureters.  The vaginal cuff was closed robotically with v-lock suture.  Excellent hemostasis was noted throughout. The pelvis was irrigated. The intraabdominal pressure was lowered assess hemostasis. After determining excellent hemostasis, the robot was undocked. The skin from all of the other ports was closed with 3-0 vicryl and fibrin glue. 27cc of 0.25% Marcaine was injected into the port sites.  The patient was then extubated and taken to recovery in stable condition.   Sponge, lap and needle counts were correct x 2.  An experienced assistant was required given the standard of surgical care given the complexity of the case.  This assistant was needed for exposure, dissection, suctioning, retraction, instrument exchange, and for overall help during the procedure.  Kaz Auld L. Harraway-Smith, M.D., [facility]

## 2020-07-18 ENCOUNTER — Encounter (HOSPITAL_BASED_OUTPATIENT_CLINIC_OR_DEPARTMENT_OTHER): Payer: Self-pay | Admitting: Obstetrics & Gynecology

## 2020-07-18 LAB — SURGICAL PATHOLOGY

## 2020-07-19 ENCOUNTER — Telehealth: Payer: Self-pay

## 2020-07-19 ENCOUNTER — Encounter: Payer: Self-pay | Admitting: *Deleted

## 2020-07-19 NOTE — Telephone Encounter (Signed)
Patient called and I checked on her post operatively.  Patient states only complaint is being sore. Patient made aware to keep taking the ibprofen  Patient states understanding. \  Patient asked about voiding and bowel habits and states she was able to go on Tuesday evening and hasnt had difficulty with this.   Patient advised to call with any concerns before her post op appointments. Patient states understanding. Kathrene Alu RN

## 2020-07-25 ENCOUNTER — Other Ambulatory Visit: Payer: Self-pay

## 2020-07-25 ENCOUNTER — Telehealth: Payer: Self-pay | Admitting: Nurse Practitioner

## 2020-07-25 ENCOUNTER — Ambulatory Visit: Payer: Medicaid Other | Attending: Nurse Practitioner | Admitting: Nurse Practitioner

## 2020-07-25 NOTE — Telephone Encounter (Signed)
CALLED AND LVM regarding televisit today

## 2020-08-02 ENCOUNTER — Ambulatory Visit (INDEPENDENT_AMBULATORY_CARE_PROVIDER_SITE_OTHER): Payer: Medicaid Other | Admitting: Obstetrics & Gynecology

## 2020-08-02 ENCOUNTER — Other Ambulatory Visit: Payer: Self-pay

## 2020-08-02 ENCOUNTER — Encounter: Payer: Self-pay | Admitting: Obstetrics & Gynecology

## 2020-08-02 VITALS — BP 167/110 | HR 91 | Wt 309.0 lb

## 2020-08-02 DIAGNOSIS — R030 Elevated blood-pressure reading, without diagnosis of hypertension: Secondary | ICD-10-CM

## 2020-08-02 DIAGNOSIS — Z9889 Other specified postprocedural states: Secondary | ICD-10-CM

## 2020-08-02 NOTE — Progress Notes (Signed)
History:  50 y.o.LMP here today for 2 week post op check.Pt is s/p RATH with bilateral salpingectomy on 07/17/2020.  Pt reports that she is doing well. She is eating and passing stools without difficulty.   She had some constipation initially that was managed by drinking Epsom salt.   The following portions of the patient's history were reviewed and updated as appropriate: allergies, current medications, past family history, past medical history, past social history, past surgical history and problem list.  Review of Systems:  Pertinent items are noted in HPI.    Objective:  Physical Exam BP (!) 167/110   Pulse 91   Wt (!) 309 lb (140.2 kg)   LMP 06/15/2020   BMI 40.77 kg/m   CONSTITUTIONAL: Well-developed, well-nourished female in no acute distress.  HENT:  Normocephalic, atraumatic EYES: Conjunctivae and EOM are normal. No scleral icterus.  NECK: Normal range of motion SKIN: Skin is warm and dry. No rash noted. Not diaphoretic.No pallor. [place]: Alert and oriented to person, place, and time. Normal coordination.  Abd: Soft, nontender and nondistended; her port sites are healing well. .  Pelvic: deferred  Labs and Imaging Surg path 07/17/2020 Clinical History: AUB, pelvic pain ([facility])    FINAL MICROSCOPIC DIAGNOSIS:   A. UTERUS, CERVIX, BILATERAL TUBES:  Uterus:  - Secretory pattern endometrium  - Leiomyoma (4.5 cm; largest)  - Adenomyosis  - No hyperplasia or malignancy identified   Cervix:  - Benign cervix with squamous metaplasia  - No dysplasia or malignancy identified   Fallopian tubes, bilateral:  - Benign fallopian tubes with paratubal cysts (1.5 cm; largest)  - No malignancy identified    GROSS DESCRIPTION:   Specimen: Uterus with attached bilateral fallopian tubes, received fresh  Specimen integrity: The uterus is previously incised.  Size and shape: 15.5 x 10.2 x 9.7 cm asymmetrical uterus  Weight: The uterus weighs 401 g.  Serosa: Smooth and  tan  Cervix: The ectocervical mucosa is smooth, tan and the external os is  patent measuring 1.4 cm. The endocervical mucosa is glistening  tan-pink.  Endometrium: The endometrial cavity is distorted by the previous  incision and multiple myometrial nodules. The endometrial cavity  measures 7.2 x 4.5 cm. The endometrium is glistening tan-red and  measures 0.2 cm in thickness  Myometrium: There are multiple (greater than 10) subserosal, intramural  and submucosal myometrial nodules which vary in size from 0.4 to 4.5 cm  in greatest dimension. The cut surfaces are solid, homogeneous and  white.  Fallopian tubes: The fallopian tubes are each of approximately the same  size measuring 7.5 cm in length and up to 0.7 cm in diameter. There are  bilateral tubal ligations with the midportion of each tube absent.  There are bilateral paratubal cysts measuring up to 1.5 cm.  Block Summary:  9 blocks submitted  1 = cervix  2, 3 = endomyometrium  4, 5 = myometrial nodules  6, 7 = right fallopian tube  8, 9 = left fallopian tube (GRP 07/17/2020)    Assessment & Plan:  2 week post op check following RATH with bilateral salpingectomy.   Doing well  Reviewed her surg path.   Reviewed post op instructions and activities  Gradual increase in activities  F/u in 4 weeks or sooner prn  Reviewed no intercourse for 8 weeks post op  All questions answered.   Elevated BP  Pt reports that she took her meds this am. She had a phone visit with her primary care  provider last week.   I have asked pt to take BP daily at home at random times and report those reading to her primary care provider.    Jacquita Mulhearn L. Harraway-Smith, M.D., [facility]

## 2020-08-06 ENCOUNTER — Encounter: Payer: Self-pay | Admitting: Nurse Practitioner

## 2020-08-06 MED ORDER — AMLODIPINE BESYLATE 5 MG PO TABS: 5.0000 mg | ORAL_TABLET | Freq: Every day | ORAL | 1 refills | Status: DC

## 2020-08-16 ENCOUNTER — Ambulatory Visit: Payer: Medicaid Other | Admitting: Pharmacist

## 2020-08-27 ENCOUNTER — Encounter: Payer: Medicaid Other | Admitting: Obstetrics & Gynecology

## 2020-08-28 ENCOUNTER — Ambulatory Visit: Payer: Medicaid Other | Admitting: Pharmacist

## 2020-09-10 ENCOUNTER — Other Ambulatory Visit: Payer: Self-pay

## 2020-09-10 ENCOUNTER — Ambulatory Visit (INDEPENDENT_AMBULATORY_CARE_PROVIDER_SITE_OTHER): Payer: Medicaid Other | Admitting: Obstetrics & Gynecology

## 2020-09-10 ENCOUNTER — Encounter: Payer: Self-pay | Admitting: General Practice

## 2020-09-10 ENCOUNTER — Encounter: Payer: Self-pay | Admitting: Obstetrics & Gynecology

## 2020-09-10 VITALS — BP 175/99 | HR 78 | Ht 72.0 in | Wt 313.0 lb

## 2020-09-10 DIAGNOSIS — L910 Hypertrophic scar: Secondary | ICD-10-CM | POA: Diagnosis not present

## 2020-09-10 DIAGNOSIS — Z9889 Other specified postprocedural states: Secondary | ICD-10-CM

## 2020-09-10 NOTE — Progress Notes (Signed)
History:  50 y.o.LMP here today for 2 week post op check.Pt is s/p RATH with bilateral salpingectomy on 07/17/2020.  Pt reports that she is doing well. She is eating and passing stools without difficulty.     The following portions of the patient's history were reviewed and updated as appropriate: allergies, current medications, past family history, past medical history, past social history, past surgical history and problem list.  Review of Systems:  Pertinent items are noted in HPI.    Objective:  Physical Exam BP (!) 175/99 (BP Location: Right Arm, Patient Position: Sitting, Cuff Size: Large)   Pulse 78   Ht 6' (1.829 m)   Wt (!) 313 lb (142 kg)   LMP 06/15/2020   BMI 42.45 kg/m   CONSTITUTIONAL: Well-developed, well-nourished female in no acute distress.  HENT:  Normocephalic, atraumatic EYES: Conjunctivae and EOM are normal. No scleral icterus.  NECK: Normal range of motion SKIN: Skin is warm and dry. No rash noted. Not diaphoretic.No pallor. [place]: Alert and oriented to person, place, and time. Normal coordination.  Abd: Soft, nontender and nondistended; her port sites are healing well. There are keloids at each of the ports sited. The largest is in the midline port.   Pelvic: vaginal cuff intact and healing well. No lesions noted.   Procedure: Pt seen for keloid on at surgical port sites.  Area x4  cleaned with alcohol.  2cc of Kenalog 40 + 4cc of Lidocaine 2% was injected into the keloids.  There were no complications. Pt instructed about s/sx of infection.  She tolerated the procedure well.    Labs and Imaging Surg path 07/17/2020 FINAL MICROSCOPIC DIAGNOSIS:   A. UTERUS, CERVIX, BILATERAL TUBES:  Uterus:  -  Secretory pattern endometrium  -  Leiomyoma (4.5 cm; largest)  -  Adenomyosis  -  No hyperplasia or malignancy identified   Cervix:  -  Benign cervix with squamous metaplasia  -  No dysplasia or malignancy identified    Fallopian tubes, bilateral:  -   Benign fallopian tubes with paratubal cysts (1.5 cm; largest)  -  No malignancy identified    Assessment & Plan:  6 week post op check following RATH with bilateral salpingectomy. Development of keloids at port sites. S/p Kenalog injection   Doing well  Reviewed her surg path.   Reviewed post op instructions and activities  Gradual increase in activities  F/u in 2 weeks to recheck port sites. Routine post check in 3 months or sooner prn  Reviewed no intercourse for 8 weeks post op  May return to all other activities.    All questions answered.   Jennye Runquist L. Harraway-Smith, M.D., [facility]

## 2020-09-10 NOTE — Progress Notes (Signed)
Pt presents today for 6 week postop after RATH and Bilateral Salpingectomy. Pt is doing well. No complaints.

## 2020-09-24 ENCOUNTER — Other Ambulatory Visit: Payer: Self-pay

## 2020-09-24 ENCOUNTER — Encounter: Payer: Medicaid Other | Admitting: Obstetrics & Gynecology

## 2020-09-24 ENCOUNTER — Encounter: Payer: Self-pay | Admitting: Obstetrics & Gynecology

## 2020-09-25 NOTE — Progress Notes (Signed)
Pt came too soon for next steroid injection.  Reschedule in 2 weeks.  This encounter was created in error - please disregard.

## 2020-10-02 ENCOUNTER — Ambulatory Visit: Payer: Medicaid Other | Admitting: Advanced Practice Midwife

## 2020-10-08 ENCOUNTER — Ambulatory Visit: Payer: Medicaid Other | Admitting: Nurse Practitioner

## 2020-10-15 ENCOUNTER — Ambulatory Visit: Payer: Medicaid Other | Admitting: Obstetrics & Gynecology

## 2020-10-15 ENCOUNTER — Encounter: Payer: Self-pay | Admitting: Obstetrics & Gynecology

## 2020-10-15 ENCOUNTER — Other Ambulatory Visit: Payer: Self-pay

## 2020-10-15 VITALS — BP 147/99 | HR 66 | Resp 16 | Ht 72.0 in | Wt 313.0 lb

## 2020-10-15 DIAGNOSIS — I10 Essential (primary) hypertension: Secondary | ICD-10-CM | POA: Diagnosis not present

## 2020-10-15 DIAGNOSIS — L910 Hypertrophic scar: Secondary | ICD-10-CM | POA: Diagnosis not present

## 2020-10-15 NOTE — Progress Notes (Signed)
   Subjective:    Patient ID: Tonya Wilkerson, female    DOB: Jul 28, 1970, 50 y.o.   MRN: [id number]  HPI 50 yo patient presents for injection of triamcinolone to reduce keloid scars from recent [place].  Pt had some resolution of 3/4 ports and the 4th grew slightly.  No other complaints   Review of Systems  Constitutional: Negative.   Respiratory: Negative.    Cardiovascular: Negative.   Gastrointestinal: Negative.   Genitourinary: Negative.       Objective:   Physical Exam Vitals reviewed.  Constitutional:      General: She is not in acute distress.    Appearance: She is well-developed.  HENT:     Head: Normocephalic and atraumatic.  Eyes:     Conjunctiva/sclera: Conjunctivae normal.  Cardiovascular:     Rate and Rhythm: Normal rate.  Pulmonary:     Effort: Pulmonary effort is normal.  Skin:    General: Skin is warm and dry.     Comments: 4 ports with keloids  Neurological:     Mental Status: She is alert and oriented to person, place, and time.  Psychiatric:        Mood and Affect: Mood normal.  Vitals:   10/15/20 1343  BP: (!) 147/99  Pulse: 66  Resp: 16  Weight: (!) 313 lb (142 kg)  Height: 6' (1.829 m)   Assessment & Plan:  50 yo female with keloid scars at [facility] sites.   Injection #2 one month after first injection. Triamcinolone 40 mg/ml injected into keloid x4.  6 cc used total.   No complications and pat tolerated injection well.  HTN--pt to follow up with PCP for BP control.

## 2020-10-21 ENCOUNTER — Other Ambulatory Visit: Payer: Self-pay | Admitting: Internal Medicine

## 2020-10-21 DIAGNOSIS — J45909 Unspecified asthma, uncomplicated: Secondary | ICD-10-CM | POA: Insufficient documentation

## 2020-10-21 NOTE — Telephone Encounter (Signed)
Requested Prescriptions  Pending Prescriptions Disp Refills  . amLODipine (NORVASC) 5 MG tablet [Pharmacy Med Name: AMLODIPINE BESYLATE '5MG'$  TABLETS] 30 tablet 1    Sig: TAKE 1 TABLET(5 MG) BY MOUTH DAILY     Cardiovascular:  Calcium Channel Blockers Failed - 10/21/2020  1:47 PM      Failed - Last BP in normal range    BP Readings from Last 1 Encounters:  10/15/20 (!) 147/99         Passed - Valid encounter within last 6 months    Recent Outpatient Visits          5 months ago Controlled type 2 diabetes mellitus without complication, without long-term current use of insulin ([place])   [place], Vernia Buff, NP   9 months ago Essential hypertension   [place], Vernia Buff, NP   1 year ago Hospital discharge follow-up   [place], Zelda W, NP   1 year ago Encounter for Papanicolaou smear for cervical cancer screening   [place], Vernia Buff, NP   1 year ago Abnormal uterine bleeding (AUB)   [place], Zelda W, NP      Future Appointments            In 1 month Gildardo Pounds, NP [place]

## 2020-10-30 ENCOUNTER — Other Ambulatory Visit: Payer: Self-pay | Admitting: Nurse Practitioner

## 2020-10-30 ENCOUNTER — Encounter: Payer: Self-pay | Admitting: Nurse Practitioner

## 2020-10-30 MED ORDER — NYSTATIN 100000 UNIT/ML MT SUSP: 5.0000 mL | Freq: Four times a day (QID) | OROMUCOSAL | 1 refills | Status: DC

## 2020-11-12 ENCOUNTER — Encounter: Payer: Self-pay | Admitting: Obstetrics & Gynecology

## 2020-11-12 ENCOUNTER — Other Ambulatory Visit: Payer: Self-pay

## 2020-11-12 ENCOUNTER — Ambulatory Visit (INDEPENDENT_AMBULATORY_CARE_PROVIDER_SITE_OTHER): Payer: Medicaid Other | Admitting: Obstetrics & Gynecology

## 2020-11-12 VITALS — BP 153/115 | Ht 72.0 in | Wt 316.0 lb

## 2020-11-12 DIAGNOSIS — L910 Hypertrophic scar: Secondary | ICD-10-CM

## 2020-11-12 DIAGNOSIS — I10 Essential (primary) hypertension: Secondary | ICD-10-CM

## 2020-11-12 NOTE — Progress Notes (Signed)
   Subjective:    Patient ID: Tonya Wilkerson, female    DOB: [birth year], 50 y.o.   MRN: [id number]  HPI  50 year old female presents for injection of keloids after her robotic hysterectomy.  The 1 in centrally above her umbilicus is the most tender.  The other ones do not bother her and are slightly decreasing in size.  Patient also hypertensive again today.  She has a note out her primary care doctor.  I encouraged her to send another note about her blood pressure still being high.  I do not believe this should go on much longer without manipulation of her blood pressure medications.  Patient is asymptomatic today.  Review of Systems  Constitutional: Negative.   Respiratory: Negative.    Cardiovascular: Negative.   Gastrointestinal: Negative.   Genitourinary: Negative.       Objective:   Physical Exam Vitals reviewed.  Constitutional:      General: She is not in acute distress.    Appearance: She is well-developed.  HENT:     Head: Normocephalic and atraumatic.  Eyes:     Conjunctiva/sclera: Conjunctivae normal.  Cardiovascular:     Rate and Rhythm: Normal rate.  Pulmonary:     Effort: Pulmonary effort is normal.  Abdominal:    Skin:    General: Skin is warm and dry.  Neurological:     Mental Status: She is alert and oriented to person, place, and time.  Psychiatric:        Mood and Affect: Mood normal.  Vitals:   11/12/20 1327  BP: (!) 153/115  Weight: (!) 316 lb (143.3 kg)  Height: 6' (1.829 m)      Assessment & Plan:  50 year old female with keloids status post R ATH. Triamcinolone injected today known and above diagram. Hypertension--patient to call PCP NP again to make sure that she is aware and hopefully change medications for better control.

## 2020-11-27 ENCOUNTER — Ambulatory Visit: Payer: Medicaid Other | Attending: Nurse Practitioner | Admitting: Nurse Practitioner

## 2020-11-27 ENCOUNTER — Encounter: Payer: Self-pay | Admitting: Nurse Practitioner

## 2020-11-27 ENCOUNTER — Other Ambulatory Visit: Payer: Self-pay

## 2020-11-27 VITALS — BP 157/120 | HR 95 | Ht 72.0 in | Wt 313.2 lb

## 2020-11-27 DIAGNOSIS — Z8249 Family history of ischemic heart disease and other diseases of the circulatory system: Secondary | ICD-10-CM | POA: Insufficient documentation

## 2020-11-27 DIAGNOSIS — F419 Anxiety disorder, unspecified: Secondary | ICD-10-CM | POA: Insufficient documentation

## 2020-11-27 DIAGNOSIS — E785 Hyperlipidemia, unspecified: Secondary | ICD-10-CM | POA: Diagnosis not present

## 2020-11-27 DIAGNOSIS — Z833 Family history of diabetes mellitus: Secondary | ICD-10-CM | POA: Diagnosis not present

## 2020-11-27 DIAGNOSIS — Z7951 Long term (current) use of inhaled steroids: Secondary | ICD-10-CM | POA: Insufficient documentation

## 2020-11-27 DIAGNOSIS — D500 Iron deficiency anemia secondary to blood loss (chronic): Secondary | ICD-10-CM

## 2020-11-27 DIAGNOSIS — E119 Type 2 diabetes mellitus without complications: Secondary | ICD-10-CM | POA: Diagnosis not present

## 2020-11-27 DIAGNOSIS — Z79899 Other long term (current) drug therapy: Secondary | ICD-10-CM | POA: Insufficient documentation

## 2020-11-27 DIAGNOSIS — Z7984 Long term (current) use of oral hypoglycemic drugs: Secondary | ICD-10-CM | POA: Insufficient documentation

## 2020-11-27 DIAGNOSIS — Z8616 Personal history of COVID-19: Secondary | ICD-10-CM | POA: Insufficient documentation

## 2020-11-27 DIAGNOSIS — J4520 Mild intermittent asthma, uncomplicated: Secondary | ICD-10-CM | POA: Diagnosis not present

## 2020-11-27 DIAGNOSIS — I10 Essential (primary) hypertension: Secondary | ICD-10-CM | POA: Diagnosis not present

## 2020-11-27 DIAGNOSIS — Z6841 Body Mass Index (BMI) 40.0 and over, adult: Secondary | ICD-10-CM | POA: Diagnosis not present

## 2020-11-27 DIAGNOSIS — G4730 Sleep apnea, unspecified: Secondary | ICD-10-CM | POA: Diagnosis not present

## 2020-11-27 LAB — POCT GLYCOSYLATED HEMOGLOBIN (HGB A1C): Hemoglobin A1C: 6 % — AB (ref 4.0–5.6)

## 2020-11-27 LAB — GLUCOSE, POCT (MANUAL RESULT ENTRY): POC Glucose: 109 mg/dl — AB (ref 70–99)

## 2020-11-27 MED ORDER — CARVEDILOL 3.125 MG PO TABS: 3.1250 mg | ORAL_TABLET | Freq: Two times a day (BID) | ORAL | 1 refills | Status: DC

## 2020-11-27 MED ORDER — BUSPIRONE HCL 15 MG PO TABS: ORAL_TABLET | ORAL | 3 refills | Status: DC

## 2020-11-27 MED ORDER — PREDNISONE 20 MG PO TABS: 20.0000 mg | ORAL_TABLET | Freq: Every day | ORAL | 0 refills | Status: DC

## 2020-11-27 MED ORDER — NYSTATIN 100000 UNIT/ML MT SUSP
5.0000 mL | Freq: Four times a day (QID) | OROMUCOSAL | 6 refills | Status: DC
Start: 2020-11-27 — End: 2020-12-10

## 2020-11-27 MED ORDER — IPRATROPIUM-ALBUTEROL 0.5-2.5 (3) MG/3ML IN SOLN: 3.0000 mL | RESPIRATORY_TRACT | 3 refills | Status: DC | PRN

## 2020-11-27 MED ORDER — LISINOPRIL 40 MG PO TABS: 40.0000 mg | ORAL_TABLET | Freq: Every day | ORAL | 1 refills | Status: DC

## 2020-11-27 MED ORDER — AMLODIPINE BESYLATE 10 MG PO TABS: ORAL_TABLET | ORAL | 1 refills | Status: DC

## 2020-11-27 NOTE — Progress Notes (Signed)
Assessment & Plan:  Tonya Wilkerson was seen today for diabetes.  Diagnoses and all orders for this visit:  Controlled type 2 diabetes mellitus without complication, without long-term current use of insulin (HCC) -     POCT glucose (manual entry) -     POCT glycosylated hemoglobin (Hb A1C) -     CMP14+EGFR -     Amb ref to Medical Nutrition Therapy-MNT Continue blood sugar control as discussed in office today, low carbohydrate diet, and regular physical exercise as tolerated, 150 minutes per week (30 min each day, 5 days per week, or 50 min 3 days per week). Keep blood sugar logs with fasting goal of 90-130 mg/dl, post prandial (after you eat) less than 180.  For Hypoglycemia: BS <60 and Hyperglycemia BS >400; contact the clinic ASAP. Annual eye exams and foot exams are recommended.   Dyslipidemia, goal LDL below 70 -     Lipid panel INSTRUCTIONS: Work on a low fat, heart healthy diet and participate in regular aerobic exercise program by working out at least 150 minutes per week; 5 days a week-30 minutes per day. Avoid red meat/beef/steak,  fried foods. junk foods, sodas, sugary drinks, unhealthy snacking, alcohol and smoking.  Drink at least 80 oz of water per day and monitor your carbohydrate intake daily.    Iron deficiency anemia due to chronic blood loss -     CBC with Differential -     Cancel: CBC  Essential hypertension -     CMP14+EGFR -     amLODipine (NORVASC) 10 MG tablet; TAKE 1 TABLET(5 MG) BY MOUTH DAILY -     lisinopril (ZESTRIL) 40 MG tablet; Take 1 tablet (40 mg total) by mouth daily. -     carvedilol (COREG) 3.125 MG tablet; Take 1 tablet (3.125 mg total) by mouth 2 (two) times daily with a meal. -     Amb ref to Medical Nutrition Therapy-MNT Continue all antihypertensives as prescribed.  Remember to bring in your blood pressure log with you for your follow up appointment.  DASH/Mediterranean Diets are healthier choices for HTN.    Anxiety -     busPIRone (BUSPAR) 15  MG tablet; TAKE 1 TABLET(15 MG) BY MOUTH THREE TIMES DAILY  Morbid obesity with BMI of 40.0-44.9, adult (HCC) -     Amb ref to Medical Nutrition Therapy-MNT  Mild intermittent asthma without complication -     ipratropium-albuterol (DUONEB) 0.5-2.5 (3) MG/3ML SOLN; Take 3 mLs by nebulization every 4 (four) hours as needed. -     nystatin (MYCOSTATIN) 100000 UNIT/ML suspension; Take 5 mLs (500,000 Units total) by mouth 4 (four) times daily. -     predniSONE (DELTASONE) 20 MG tablet; Take 1 tablet (20 mg total) by mouth daily with breakfast. Discussed diet and exercise for person with BMI >40. Instructed: You must burn more calories than you eat. Losing 5 percent of your body weight should be considered a success. In the longer term, losing more than 15 percent of your body weight and staying at this weight is an extremely good result. However, keep in mind that even losing 5 percent of your body weight leads to important health benefits, so try not to get discouraged if you're not able to lose more than this.    Patient has been counseled on age-appropriate routine health concerns for screening and prevention. These are reviewed and up-to-date. Referrals have been placed accordingly. Immunizations are up-to-date or declined.    Subjective:   Chief Complaint  Patient presents with   Diabetes   [name] 50 y.o. female presents to office today for follow up to DM, HTN and HPL. She has a past medical history of Abnormal uterine bleeding (AUB), Anemia, Anxiety, Asthma, COVID (03/2020), Dyspnea, History of blood transfusion (yrs ago), History of pre-eclampsia (17 yrs ago), Hypertension, Morbid obesity with BMI of 40.0-44.9, adult ([place]), Pneumonia due to infectious organism (2020), Sleep apnea (dx 2021), and Wears glasses.    DM  Well controlled. She is not taking metformin 500 mg daily. LDL not optimal with pravastatin 20 mg daily. She denies any history of myalgias.  Lab Results   Component Value Date   HGBA1C 6.0 (A) 11/27/2020   Lab Results  Component Value Date   LDLCALC 106 (H) 11/27/2020     HTN Blood pressure is elevated. Will add carvedilol 3.125 mg BID. She will continue on amlodipine 10 mg and lisinopril 40 mg daily. Denies chest pain, shortness of breath, palpitations, lightheadedness, dizziness, headaches or BLE edema.   BP Readings from Last 3 Encounters:  11/27/20 (!) 157/120  11/12/20 (!) 153/115  10/15/20 (!) 147/99    Asthma Well controlled at this time. Denies cough, wheezing or shortness of breath   Anxiety  Well controlled with prn buspar. Review of Systems  Constitutional:  Negative for fever, malaise/fatigue and weight loss.  HENT: Negative.  Negative for nosebleeds.   Eyes: Negative.  Negative for blurred vision, double vision and photophobia.  Respiratory: Negative.  Negative for cough and shortness of breath.   Cardiovascular: Negative.  Negative for chest pain, palpitations and leg swelling.  Gastrointestinal: Negative.  Negative for heartburn, nausea and vomiting.  Musculoskeletal: Negative.  Negative for myalgias.  Neurological: Negative.  Negative for dizziness, focal weakness, seizures and headaches.  Psychiatric/Behavioral: Negative.  Negative for suicidal ideas.    Past Medical History:  Diagnosis Date   Abnormal uterine bleeding (AUB)    Anemia    Anxiety    Asthma    COVID 03/2020   mild cold symptoms x 3 days all symptoms resolved   Dyspnea    with exertion   History of blood transfusion yrs ago   after childbirth   History of pre-eclampsia 17 yrs ago   Hypertension    Morbid obesity with BMI of 40.0-44.9, adult ([place])    Pneumonia due to infectious organism 2020   Sleep apnea dx 2021   did not tolerate cpap mild osa    Wears glasses     Past Surgical History:  Procedure Laterality Date   CYSTOSCOPY N/A 07/17/2020   Procedure: CYSTOSCOPY;  Surgeon: Lavonia Drafts, MD;  Location: [facility];  Service: Gynecology;  Laterality: N/A;   ROBOTIC ASSISTED LAPAROSCOPIC HYSTERECTOMY AND SALPINGECTOMY Bilateral 07/17/2020   Procedure: XI ROBOTIC ASSISTED TOTAL HYSTERECTOMY WITH BILATERAL SALPINGECTOMY;  Surgeon: Lavonia Drafts, MD;  Location: [facility];  Service: Gynecology;  Laterality: Bilateral;   TUBAL LIGATION  17 yrs ago    Family History  Problem Relation Age of Onset   Hypertension Mother    Stroke Mother    Diabetes Maternal Aunt    Cancer Maternal Grandfather     Social History Reviewed with no changes to be made today.   Outpatient Medications Prior to Visit  Medication Sig Dispense Refill   albuterol (VENTOLIN HFA) 108 (90 Base) MCG/ACT inhaler Inhale 2 puffs into the lungs every 6 (six) hours as needed for wheezing or shortness of breath. 8 g 2  Blood Glucose Monitoring Suppl (ACCU-CHEK GUIDE) w/Device KIT 1 each by Does not apply route 2 (two) times daily. 1 kit 0   docusate sodium (COLACE) 100 MG capsule Take 1 capsule (100 mg total) by mouth 2 (two) times daily. 10 capsule 0   fluticasone furoate-vilanterol (BREO ELLIPTA) 100-25 MCG/INH AEPB Inhale 1 puff into the lungs daily. 60 each 3   glucose blood (ACCU-CHEK GUIDE) test strip Use as instructed. Inject into the skin twice daily E11.65 200 each 12   ibuprofen (ADVIL) 600 MG tablet Take 1 tablet (600 mg total) by mouth every 6 (six) hours. 40 tablet 1   metFORMIN (GLUCOPHAGE) 500 MG tablet Take 1 tablet (500 mg total) by mouth daily with breakfast. 90 tablet 1   pseudoephedrine (SUDAFED) 30 MG tablet Take 30 mg by mouth every 4 (four) hours as needed for congestion.     traZODone (DESYREL) 150 MG tablet TAKE 1 TABLET(150 MG) BY MOUTH AT BEDTIME (Patient taking differently: at bedtime as needed.) 90 tablet 1   amLODipine (NORVASC) 5 MG tablet TAKE 1 TABLET(5 MG) BY MOUTH DAILY 30 tablet 1   busPIRone (BUSPAR) 15 MG tablet TAKE 1 TABLET(15 MG) BY MOUTH THREE TIMES DAILY (Patient  taking differently: 3 (three) times daily as needed.) 90 tablet 3   ipratropium-albuterol (DUONEB) 0.5-2.5 (3) MG/3ML SOLN Take 3 mLs by nebulization every 4 (four) hours as needed. 360 mL 0   isosorbide mononitrate (IMDUR) 30 MG 24 hr tablet Take 30 mg by mouth daily.     nystatin (MYCOSTATIN) 100000 UNIT/ML suspension Take 5 mLs (500,000 Units total) by mouth 4 (four) times daily. 60 mL 1   ferrous sulfate 325 (65 FE) MG tablet Take 1 tablet (325 mg total) by mouth 2 (two) times daily with a meal. 180 tablet 0   lisinopril (ZESTRIL) 40 MG tablet Take 1 tablet (40 mg total) by mouth daily. 90 tablet 1   No facility-administered medications prior to visit.    No Known Allergies     Objective:    BP (!) 157/120   Pulse 95   Ht 6' (1.829 m)   Wt (!) 313 lb 4 oz (142.1 kg)   LMP 06/15/2020   SpO2 98%   BMI 42.48 kg/m  Wt Readings from Last 3 Encounters:  11/27/20 (!) 313 lb 4 oz (142.1 kg)  11/12/20 (!) 316 lb (143.3 kg)  10/15/20 (!) 313 lb (142 kg)    Physical Exam Vitals and nursing note reviewed.  Constitutional:      Appearance: She is well-developed.  HENT:     Head: Normocephalic and atraumatic.  Cardiovascular:     Rate and Rhythm: Normal rate and regular rhythm.     Heart sounds: Normal heart sounds. No murmur heard.   No friction rub. No gallop.  Pulmonary:     Effort: Pulmonary effort is normal. No tachypnea or respiratory distress.     Breath sounds: Normal breath sounds. No decreased breath sounds, wheezing, rhonchi or rales.  Chest:     Chest wall: No tenderness.  Abdominal:     General: Bowel sounds are normal.     Palpations: Abdomen is soft.  Musculoskeletal:        General: Normal range of motion.     Cervical back: Normal range of motion.  Skin:    General: Skin is warm and dry.  Neurological:     Mental Status: She is alert and oriented to person, place, and time.  Coordination: Coordination normal.  Psychiatric:        Behavior: Behavior  normal. Behavior is cooperative.        Thought Content: Thought content normal.        Judgment: Judgment normal.         Patient has been counseled extensively about nutrition and exercise as well as the importance of adherence with medications and regular follow-up. The patient was given clear instructions to go to ER or return to medical center if symptoms don't improve, worsen or new problems develop. The patient verbalized understanding.   Follow-up: Return in about 3 months (around 02/26/2021). She will reach out to me through [facility] regarding blood pressure readings over the next 2 weeks.   Gildardo Pounds, FNP-BC [facility] and [place], [place]   12/01/2020, 8:28 PM

## 2020-11-27 NOTE — Patient Instructions (Signed)
Take amlodipine and carvedilol in the morning Taking lisinopril and carvedilol in the evening

## 2020-11-28 LAB — CBC WITH DIFFERENTIAL/PLATELET
Basophils Absolute: 0.1 10*3/uL (ref 0.0–0.2)
Basos: 1 %
EOS (ABSOLUTE): 0.4 10*3/uL (ref 0.0–0.4)
Eos: 5 %
Hematocrit: 41.9 % (ref 34.0–46.6)
Hemoglobin: 12.2 g/dL (ref 11.1–15.9)
Immature Grans (Abs): 0 10*3/uL (ref 0.0–0.1)
Immature Granulocytes: 0 %
Lymphocytes Absolute: 1.8 10*3/uL (ref 0.7–3.1)
Lymphs: 22 %
MCH: 19.7 pg — ABNORMAL LOW (ref 26.6–33.0)
MCHC: 29.1 g/dL — ABNORMAL LOW (ref 31.5–35.7)
MCV: 68 fL — ABNORMAL LOW (ref 79–97)
Monocytes Absolute: 0.6 10*3/uL (ref 0.1–0.9)
Monocytes: 8 %
Neutrophils Absolute: 5.3 10*3/uL (ref 1.4–7.0)
Neutrophils: 64 %
Platelets: 578 10*3/uL — ABNORMAL HIGH (ref 150–450)
RBC: 6.18 x10E6/uL — ABNORMAL HIGH (ref 3.77–5.28)
RDW: 22.7 % — ABNORMAL HIGH (ref 11.7–15.4)
WBC: 8.2 10*3/uL (ref 3.4–10.8)

## 2020-11-28 LAB — CMP14+EGFR
ALT: 17 IU/L (ref 0–32)
AST: 18 IU/L (ref 0–40)
Albumin/Globulin Ratio: 1.4 (ref 1.2–2.2)
Albumin: 4.3 g/dL (ref 3.8–4.8)
Alkaline Phosphatase: 148 IU/L — ABNORMAL HIGH (ref 44–121)
BUN/Creatinine Ratio: 7 — ABNORMAL LOW (ref 9–23)
BUN: 6 mg/dL (ref 6–24)
Bilirubin Total: 0.3 mg/dL (ref 0.0–1.2)
CO2: 24 mmol/L (ref 20–29)
Calcium: 9.8 mg/dL (ref 8.7–10.2)
Chloride: 100 mmol/L (ref 96–106)
Creatinine, Ser: 0.86 mg/dL (ref 0.57–1.00)
Globulin, Total: 3 g/dL (ref 1.5–4.5)
Glucose: 95 mg/dL (ref 65–99)
Potassium: 5.5 mmol/L — ABNORMAL HIGH (ref 3.5–5.2)
Sodium: 140 mmol/L (ref 134–144)
Total Protein: 7.3 g/dL (ref 6.0–8.5)
eGFR: 82 mL/min/{1.73_m2} (ref >59)

## 2020-11-28 LAB — LIPID PANEL
Chol/HDL Ratio: 2.4 ratio (ref 0.0–4.4)
Cholesterol, Total: 220 mg/dL — ABNORMAL HIGH (ref 100–199)
HDL: 92 mg/dL (ref >39)
LDL Chol Calc (NIH): 106 mg/dL — ABNORMAL HIGH (ref 0–99)
Triglycerides: 131 mg/dL (ref 0–149)
VLDL Cholesterol Cal: 22 mg/dL (ref 5–40)

## 2020-11-29 ENCOUNTER — Other Ambulatory Visit: Payer: Self-pay | Admitting: Nurse Practitioner

## 2020-11-29 MED ORDER — PRAVASTATIN SODIUM 20 MG PO TABS: 20.0000 mg | ORAL_TABLET | Freq: Every day | ORAL | 3 refills | Status: DC

## 2020-12-01 ENCOUNTER — Encounter: Payer: Self-pay | Admitting: Nurse Practitioner

## 2020-12-02 ENCOUNTER — Other Ambulatory Visit: Payer: Self-pay | Admitting: Nurse Practitioner

## 2020-12-02 MED ORDER — LOSARTAN POTASSIUM 50 MG PO TABS: 25.0000 mg | ORAL_TABLET | Freq: Every day | ORAL | 0 refills | Status: DC

## 2020-12-10 ENCOUNTER — Telehealth: Payer: Self-pay | Admitting: *Deleted

## 2020-12-10 ENCOUNTER — Ambulatory Visit: Payer: Medicaid Other | Admitting: Family Medicine

## 2020-12-10 ENCOUNTER — Other Ambulatory Visit: Payer: Self-pay

## 2020-12-10 ENCOUNTER — Encounter: Payer: Self-pay | Admitting: Family Medicine

## 2020-12-10 VITALS — BP 170/119 | HR 103 | Resp 16 | Ht 72.0 in | Wt 306.0 lb

## 2020-12-10 DIAGNOSIS — L910 Hypertrophic scar: Secondary | ICD-10-CM | POA: Diagnosis not present

## 2020-12-10 DIAGNOSIS — I10 Essential (primary) hypertension: Secondary | ICD-10-CM

## 2020-12-10 NOTE — Telephone Encounter (Signed)
Patient is okay with seeing Dr. Kennon Rounds.

## 2020-12-10 NOTE — Assessment & Plan Note (Signed)
Patient declined further injection today. To return for further injection if keloids worsen.

## 2020-12-10 NOTE — Progress Notes (Signed)
   Subjective:    Patient ID: Tonya Wilkerson is a 50 y.o. female presenting with Follow-up  on 12/10/2020  HPI: Patient is s/p RATH who developed some keloids following surgery. Seen by physician x 2 and injected with triamcinolone x 2. Good results after last injection.  Review of Systems  Constitutional:  Negative for chills and fever.  Respiratory:  Negative for shortness of breath.   Cardiovascular:  Negative for chest pain.  Gastrointestinal:  Negative for abdominal pain, nausea and vomiting.  Genitourinary:  Negative for dysuria.  Skin:  Negative for rash.     Objective:    BP (!) 170/119   Pulse (!) 103   Resp 16   Ht 6' (1.829 m)   Wt (!) 306 lb (138.8 kg)   LMP 06/15/2020   BMI 41.50 kg/m  Physical Exam Constitutional:      General: She is not in acute distress.    Appearance: She is well-developed.  HENT:     Head: Normocephalic and atraumatic.  Eyes:     General: No scleral icterus. Cardiovascular:     Rate and Rhythm: Normal rate.  Pulmonary:     Effort: Pulmonary effort is normal.  Abdominal:     Palpations: Abdomen is soft.  Musculoskeletal:     Cervical back: Neck supple.  Skin:    General: Skin is warm and dry.     Comments: 4 ports and only 1 with slight thickness. Others are now flat.  Neurological:     Mental Status: She is alert and oriented to person, place, and time.        Assessment & Plan:   Problem List Items Addressed This Visit       Unprioritized   Essential hypertension    She had stopped her carvedilol due to significant pulmonary issues. She resumed meds and will monitor BP at home. To call PCP if needed to adjust her meds.      Keloid scar - Primary    Patient declined further injection today. To return for further injection if keloids worsen.       No follow-ups on file.  Donnamae Jude 12/10/2020 4:37 PM

## 2020-12-10 NOTE — Assessment & Plan Note (Signed)
She had stopped her carvedilol due to significant pulmonary issues. She resumed meds and will monitor BP at home. To call PCP if needed to adjust her meds.

## 2021-01-16 ENCOUNTER — Encounter: Payer: Medicaid Other | Attending: Nurse Practitioner | Admitting: Dietician

## 2021-02-27 ENCOUNTER — Ambulatory Visit: Payer: Medicaid Other | Admitting: Nurse Practitioner

## 2021-03-20 ENCOUNTER — Other Ambulatory Visit: Payer: Self-pay | Admitting: Nurse Practitioner

## 2021-03-21 NOTE — Telephone Encounter (Signed)
Requested medication (s) are due for refill today - yes  Requested medication (s) are on the active medication list -yes  Future visit scheduled -yes  Last refill: 12/02/20 #90  Notes to clinic: Request RF: fails lab protocol- no lab/office follow up as directed, does have upcoming appointment  Requested Prescriptions  Pending Prescriptions Disp Refills   losartan (COZAAR) 50 MG tablet [Pharmacy Med Name: LOSARTAN 50MG  TABLETS] 90 tablet 0    Sig: TAKE 1/2 TO 1 TABLET BY MOUTH DAILY AS DIRECTED     Cardiovascular:  Angiotensin Receptor Blockers Failed - 03/20/2021  4:32 PM      Failed - K in normal range and within 180 days    Potassium  Date Value Ref Range Status  11/27/2020 5.5 (H) 3.5 - 5.2 mmol/L Final          Failed - Last BP in normal range    BP Readings from Last 1 Encounters:  12/10/20 (!) 170/119          Passed - Cr in normal range and within 180 days    Creatinine, Ser  Date Value Ref Range Status  11/27/2020 0.86 0.57 - 1.00 mg/dL Final          Passed - Patient is not pregnant      Passed - Valid encounter within last 6 months    Recent Outpatient Visits           3 months ago Controlled type 2 diabetes mellitus without complication, without long-term current use of insulin ([place])   [place], [place] W, NP   10 months ago Controlled type 2 diabetes mellitus without complication, without long-term current use of insulin ([place])   [place], Vernia Buff, NP   1 year ago Essential hypertension   [place], Vernia Buff, NP   1 year ago Hospital discharge follow-up   [place], Zelda W, NP   1 year ago Encounter for Papanicolaou smear for cervical cancer screening   [place], Vernia Buff, NP       Future Appointments             In 1 month Gildardo Pounds, NP [place]               Requested Prescriptions  Pending Prescriptions Disp Refills   losartan (COZAAR) 50 MG tablet [Pharmacy Med Name: LOSARTAN 50MG  TABLETS] 90 tablet 0    Sig: TAKE 1/2 TO 1 TABLET BY MOUTH DAILY AS DIRECTED     Cardiovascular:  Angiotensin Receptor Blockers Failed - 03/20/2021  4:32 PM      Failed - K in normal range and within 180 days    Potassium  Date Value Ref Range Status  11/27/2020 5.5 (H) 3.5 - 5.2 mmol/L Final          Failed - Last BP in normal range    BP Readings from Last 1 Encounters:  12/10/20 (!) 170/119          Passed - Cr in normal range and within 180 days    Creatinine, Ser  Date Value Ref Range Status  11/27/2020 0.86 0.57 - 1.00 mg/dL Final          Passed - Patient is not pregnant      Passed - Valid encounter within last 6  months    Recent Outpatient Visits           3 months ago Controlled type 2 diabetes mellitus without complication, without long-term current use of insulin ([place])   [place], [place] W, NP   10 months ago Controlled type 2 diabetes mellitus without complication, without long-term current use of insulin [facility])   [place], Vernia Buff, NP   1 year ago Essential hypertension   [place], Zelda W, NP   1 year ago Hospital discharge follow-up   [place], Zelda W, NP   1 year ago Encounter for Papanicolaou smear for cervical cancer screening   [place], Zelda W, NP       Future Appointments             In 1 month Gildardo Pounds, NP [place]

## 2021-05-01 ENCOUNTER — Other Ambulatory Visit: Payer: Self-pay

## 2021-05-01 ENCOUNTER — Other Ambulatory Visit: Payer: Self-pay | Admitting: Nurse Practitioner

## 2021-05-01 ENCOUNTER — Encounter: Payer: Self-pay | Admitting: Nurse Practitioner

## 2021-05-01 ENCOUNTER — Ambulatory Visit: Payer: Medicaid Other | Attending: Nurse Practitioner | Admitting: Nurse Practitioner

## 2021-05-01 VITALS — BP 142/99 | HR 87 | Ht 72.0 in | Wt 319.0 lb

## 2021-05-01 DIAGNOSIS — F32A Depression, unspecified: Secondary | ICD-10-CM | POA: Diagnosis not present

## 2021-05-01 DIAGNOSIS — Z1211 Encounter for screening for malignant neoplasm of colon: Secondary | ICD-10-CM

## 2021-05-01 DIAGNOSIS — Z1231 Encounter for screening mammogram for malignant neoplasm of breast: Secondary | ICD-10-CM

## 2021-05-01 DIAGNOSIS — F419 Anxiety disorder, unspecified: Secondary | ICD-10-CM | POA: Diagnosis not present

## 2021-05-01 DIAGNOSIS — D500 Iron deficiency anemia secondary to blood loss (chronic): Secondary | ICD-10-CM | POA: Diagnosis not present

## 2021-05-01 DIAGNOSIS — J4520 Mild intermittent asthma, uncomplicated: Secondary | ICD-10-CM

## 2021-05-01 DIAGNOSIS — E119 Type 2 diabetes mellitus without complications: Secondary | ICD-10-CM | POA: Diagnosis not present

## 2021-05-01 DIAGNOSIS — I10 Essential (primary) hypertension: Secondary | ICD-10-CM

## 2021-05-01 DIAGNOSIS — E785 Hyperlipidemia, unspecified: Secondary | ICD-10-CM | POA: Diagnosis not present

## 2021-05-01 DIAGNOSIS — Z6841 Body Mass Index (BMI) 40.0 and over, adult: Secondary | ICD-10-CM

## 2021-05-01 LAB — POCT GLYCOSYLATED HEMOGLOBIN (HGB A1C): Hemoglobin A1C: 6 % — AB (ref 4.0–5.6)

## 2021-05-01 LAB — GLUCOSE, POCT (MANUAL RESULT ENTRY): POC Glucose: 116 mg/dl — AB (ref 70–99)

## 2021-05-01 MED ORDER — FLUTICASONE FUROATE-VILANTEROL 100-25 MCG/ACT IN AEPB: 1.0000 | INHALATION_SPRAY | Freq: Every day | RESPIRATORY_TRACT | 11 refills | Status: AC

## 2021-05-01 MED ORDER — AMLODIPINE BESYLATE 10 MG PO TABS: 10.0000 mg | ORAL_TABLET | Freq: Every day | ORAL | 1 refills | Status: DC

## 2021-05-01 MED ORDER — LOSARTAN POTASSIUM 50 MG PO TABS: 50.0000 mg | ORAL_TABLET | Freq: Every day | ORAL | 1 refills | Status: DC

## 2021-05-01 MED ORDER — ESCITALOPRAM OXALATE 10 MG PO TABS: 10.0000 mg | ORAL_TABLET | Freq: Every day | ORAL | 1 refills | Status: DC

## 2021-05-01 NOTE — Patient Instructions (Signed)
BP GOALS TOP NUMBER 130 or less BOTTOM NUMBER 80 or less

## 2021-05-01 NOTE — Progress Notes (Signed)
Assessment & Plan:  Tonya Wilkerson was seen today for hypertension and diabetes.  Diagnoses and all orders for this visit:  Essential hypertension -     CMP14+EGFR -     losartan (COZAAR) 50 MG tablet; Take 1 tablet (50 mg total) by mouth daily. -     amLODipine (NORVASC) 10 MG tablet; Take 1 tablet (10 mg total) by mouth daily.  Type 2 diabetes mellitus without complication, without long-term current use of insulin (HCC) -     Hemoglobin A1c -     POCT glycosylated hemoglobin (Hb A1C) -     POCT glucose (manual entry) -     Referral to Nutrition and Diabetes Services  Breast cancer screening by mammogram -     MM DIGITAL SCREENING BILATERAL; Future -     POCT glucose (manual entry)  Colon cancer screening -     Ambulatory referral to Gastroenterology  Iron deficiency anemia due to chronic blood loss -     CBC  Dyslipidemia, goal LDL below 70 -     Lipid panel  Morbid obesity with BMI of 40.0-44.9, adult ([place]) -     Referral to Nutrition and Diabetes Services  Anxiety and depression -     escitalopram (LEXAPRO) 10 MG tablet; Take 1 tablet (10 mg total) by mouth daily.  Mild intermittent asthma without complication -     fluticasone furoate-vilanterol (BREO ELLIPTA) 100-25 MCG/ACT AEPB; Inhale 1 puff into the lungs daily.    Patient has been counseled on age-appropriate routine health concerns for screening and prevention. These are reviewed and up-to-date. Referrals have been placed accordingly. Immunizations are up-to-date or declined.    Subjective:   Chief Complaint  Patient presents with   Hypertension   Diabetes   [name] 51 y.o. female presents to office today for follow up to HTN and DM.  She has a past medical history of Abnormal uterine bleeding (AUB), Anemia, Anxiety, Asthma, COVID (03/2020), Hypertension, Morbid obesity with BMI of 40.0-44.9, adult ([place]), Pneumonia due to infectious organism (2020), Sleep apnea (dx 2021), and DM 2   Lab Results   Component Value Date   LDLCALC 106 (H) 11/27/2020     HTN BP not optimal. She stopped taking carvedilol as she felt is was exacerbating her asthma symptoms. She is currently taking amlodipine 10 mg daily and losartan 25 mg which we will increase to 50 mg daily today.  BP Readings from Last 3 Encounters:  05/01/21 (!) 142/99  12/10/20 (!) 170/119  11/27/20 (!) 157/120     DM 2 Well controlled. She is no longer taking metformin. LDL not at goal with pravastatin 20 mg daily.  Lab Results  Component Value Date   HGBA1C 6.0 (A) 05/01/2021   Lab Results  Component Value Date   LDLCALC 106 (H) 11/27/2020    Anxiety and Depression  Notes increased irritability, lack of motivation and difficulty sleeping. Taking buspar for anxiety and notes it does tend to make her drowsy so she does not take this often.   Review of Systems  Constitutional:  Negative for fever, malaise/fatigue and weight loss.  HENT: Negative.  Negative for nosebleeds.   Eyes: Negative.  Negative for blurred vision, double vision and photophobia.  Respiratory: Negative.  Negative for cough and shortness of breath.   Cardiovascular: Negative.  Negative for chest pain, palpitations and leg swelling.  Gastrointestinal: Negative.  Negative for heartburn, nausea and vomiting.  Musculoskeletal: Negative.  Negative for myalgias.  Neurological: Negative.  Negative for dizziness, focal weakness, seizures and headaches.  Psychiatric/Behavioral:  Positive for depression. Negative for suicidal ideas. The patient is nervous/anxious.    Past Medical History:  Diagnosis Date   Abnormal uterine bleeding (AUB)    Anemia    Anxiety    Asthma    COVID 03/2020   mild cold symptoms x 3 days all symptoms resolved   Dyspnea    with exertion   History of blood transfusion yrs ago   after childbirth   History of pre-eclampsia 17 yrs ago   Hypertension    Morbid obesity with BMI of 40.0-44.9, adult ([place])    Pneumonia due to  infectious organism 2020   Sleep apnea dx 2021   did not tolerate cpap mild osa    Wears glasses     Past Surgical History:  Procedure Laterality Date   CYSTOSCOPY N/A 07/17/2020   Procedure: CYSTOSCOPY;  Surgeon: Lavonia Drafts, MD;  Location: [facility];  Service: Gynecology;  Laterality: N/A;   ROBOTIC ASSISTED LAPAROSCOPIC HYSTERECTOMY AND SALPINGECTOMY Bilateral 07/17/2020   Procedure: XI ROBOTIC ASSISTED TOTAL HYSTERECTOMY WITH BILATERAL SALPINGECTOMY;  Surgeon: Lavonia Drafts, MD;  Location: [facility];  Service: Gynecology;  Laterality: Bilateral;   TUBAL LIGATION  17 yrs ago    Family History  Problem Relation Age of Onset   Hypertension Mother    Stroke Mother    Diabetes Maternal Aunt    Cancer Maternal Grandfather     Social History Reviewed with no changes to be made today.   Outpatient Medications Prior to Visit  Medication Sig Dispense Refill   albuterol (VENTOLIN HFA) 108 (90 Base) MCG/ACT inhaler Inhale 2 puffs into the lungs every 6 (six) hours as needed for wheezing or shortness of breath. 8 g 2   Blood Glucose Monitoring Suppl (ACCU-CHEK GUIDE) w/Device KIT 1 each by Does not apply route 2 (two) times daily. 1 kit 0   busPIRone (BUSPAR) 15 MG tablet TAKE 1 TABLET(15 MG) BY MOUTH THREE TIMES DAILY 90 tablet 3   docusate sodium (COLACE) 100 MG capsule Take 1 capsule (100 mg total) by mouth 2 (two) times daily. 10 capsule 0   fluticasone furoate-vilanterol (BREO ELLIPTA) 100-25 MCG/INH AEPB Inhale 1 puff into the lungs daily. 60 each 3   glucose blood (ACCU-CHEK GUIDE) test strip Use as instructed. Inject into the skin twice daily E11.65 200 each 12   ibuprofen (ADVIL) 600 MG tablet Take 1 tablet (600 mg total) by mouth every 6 (six) hours. 40 tablet 1   ipratropium-albuterol (DUONEB) 0.5-2.5 (3) MG/3ML SOLN Take 3 mLs by nebulization every 4 (four) hours as needed. 360 mL 3   pseudoephedrine (SUDAFED) 30 MG tablet Take  30 mg by mouth every 4 (four) hours as needed for congestion.     traZODone (DESYREL) 150 MG tablet TAKE 1 TABLET(150 MG) BY MOUTH AT BEDTIME (Patient taking differently: at bedtime as needed.) 90 tablet 1   amLODipine (NORVASC) 10 MG tablet TAKE 1 TABLET(5 MG) BY MOUTH DAILY 90 tablet 1   losartan (COZAAR) 50 MG tablet TAKE 1/2 TO 1 TABLET BY MOUTH DAILY AS DIRECTED 90 tablet 0   metFORMIN (GLUCOPHAGE) 500 MG tablet Take 1 tablet (500 mg total) by mouth daily with breakfast. (Patient not taking: Reported on 05/01/2021) 90 tablet 1   pravastatin (PRAVACHOL) 20 MG tablet Take 1 tablet (20 mg total) by mouth daily. (Patient not taking: Reported on 05/01/2021) 90 tablet 3  carvedilol (COREG) 3.125 MG tablet Take 1 tablet (3.125 mg total) by mouth 2 (two) times daily with a meal. (Patient not taking: Reported on 12/10/2020) 60 tablet 1   ferrous sulfate 325 (65 FE) MG tablet Take 1 tablet (325 mg total) by mouth 2 (two) times daily with a meal. 180 tablet 0   No facility-administered medications prior to visit.    No Known Allergies     Objective:    BP (!) 142/99    Pulse 87    Ht 6' (1.829 m)    Wt (!) 319 lb (144.7 kg)    LMP 06/15/2020    SpO2 97%    BMI 43.26 kg/m  Wt Readings from Last 3 Encounters:  05/01/21 (!) 319 lb (144.7 kg)  12/10/20 (!) 306 lb (138.8 kg)  11/27/20 (!) 313 lb 4 oz (142.1 kg)    Physical Exam Vitals and nursing note reviewed.  Constitutional:      Appearance: She is well-developed.  HENT:     Head: Normocephalic and atraumatic.  Cardiovascular:     Rate and Rhythm: Normal rate and regular rhythm.     Heart sounds: Normal heart sounds. No murmur heard.   No friction rub. No gallop.  Pulmonary:     Effort: Pulmonary effort is normal. No tachypnea or respiratory distress.     Breath sounds: Normal breath sounds. No decreased breath sounds, wheezing, rhonchi or rales.  Chest:     Chest wall: No tenderness.  Abdominal:     General: Bowel sounds are normal.      Palpations: Abdomen is soft.  Musculoskeletal:        General: Normal range of motion.     Cervical back: Normal range of motion.  Skin:    General: Skin is warm and dry.  Neurological:     Mental Status: She is alert and oriented to person, place, and time.     Coordination: Coordination normal.  Psychiatric:        Behavior: Behavior normal. Behavior is cooperative.        Thought Content: Thought content normal.        Judgment: Judgment normal.         Patient has been counseled extensively about nutrition and exercise as well as the importance of adherence with medications and regular follow-up. The patient was given clear instructions to go to ER or return to medical center if symptoms don't improve, worsen or new problems develop. The patient verbalized understanding.   Follow-up: Return in about 4 weeks (around 05/29/2021) for virtual or tele in 4 weeks depression.   Gildardo Pounds, FNP-BC [facility] and [place], [place]   05/01/2021, 9:38 PM

## 2021-05-01 NOTE — Telephone Encounter (Signed)
Provider already signed for refill.  Requested Prescriptions  Pending Prescriptions Disp Refills   fluticasone furoate-vilanterol (BREO ELLIPTA) 100-25 MCG/ACT AEPB [Pharmacy Med Name: FLUTIC/VILAN 100-25MCG ORAL INH(30)] 60 each 3    Sig: INHALE 1 PUFF INTO THE LUNGS DAILY     There is no refill protocol information for this order

## 2021-05-02 LAB — CMP14+EGFR
ALT: 17 IU/L (ref 0–32)
AST: 15 IU/L (ref 0–40)
Albumin/Globulin Ratio: 1.6 (ref 1.2–2.2)
Albumin: 4.4 g/dL (ref 3.8–4.9)
Alkaline Phosphatase: 123 IU/L — ABNORMAL HIGH (ref 44–121)
BUN/Creatinine Ratio: 9 (ref 9–23)
BUN: 7 mg/dL (ref 6–24)
Bilirubin Total: 0.3 mg/dL (ref 0.0–1.2)
CO2: 22 mmol/L (ref 20–29)
Calcium: 9.4 mg/dL (ref 8.7–10.2)
Chloride: 105 mmol/L (ref 96–106)
Creatinine, Ser: 0.82 mg/dL (ref 0.57–1.00)
Globulin, Total: 2.7 g/dL (ref 1.5–4.5)
Glucose: 104 mg/dL — ABNORMAL HIGH (ref 70–99)
Potassium: 4.4 mmol/L (ref 3.5–5.2)
Sodium: 143 mmol/L (ref 134–144)
Total Protein: 7.1 g/dL (ref 6.0–8.5)
eGFR: 87 mL/min/{1.73_m2} (ref >59)

## 2021-05-02 LAB — CBC
Hematocrit: 44.5 % (ref 34.0–46.6)
Hemoglobin: 14.3 g/dL (ref 11.1–15.9)
MCH: 24.1 pg — ABNORMAL LOW (ref 26.6–33.0)
MCHC: 32.1 g/dL (ref 31.5–35.7)
MCV: 75 fL — ABNORMAL LOW (ref 79–97)
Platelets: 442 10*3/uL (ref 150–450)
RBC: 5.94 x10E6/uL — ABNORMAL HIGH (ref 3.77–5.28)
RDW: 20.7 % — ABNORMAL HIGH (ref 11.7–15.4)
WBC: 8 10*3/uL (ref 3.4–10.8)

## 2021-05-02 LAB — LIPID PANEL
Chol/HDL Ratio: 2 ratio (ref 0.0–4.4)
Cholesterol, Total: 200 mg/dL — ABNORMAL HIGH (ref 100–199)
HDL: 101 mg/dL (ref >39)
LDL Chol Calc (NIH): 85 mg/dL (ref 0–99)
Triglycerides: 80 mg/dL (ref 0–149)
VLDL Cholesterol Cal: 14 mg/dL (ref 5–40)

## 2021-05-02 LAB — HEMOGLOBIN A1C
Est. average glucose Bld gHb Est-mCnc: 120 mg/dL
Hgb A1c MFr Bld: 5.8 % — ABNORMAL HIGH (ref 4.8–5.6)

## 2021-05-16 ENCOUNTER — Other Ambulatory Visit: Payer: Self-pay

## 2021-05-16 ENCOUNTER — Ambulatory Visit (INDEPENDENT_AMBULATORY_CARE_PROVIDER_SITE_OTHER): Payer: Medicaid Other

## 2021-05-16 DIAGNOSIS — Z1231 Encounter for screening mammogram for malignant neoplasm of breast: Secondary | ICD-10-CM

## 2021-05-28 ENCOUNTER — Ambulatory Visit: Payer: Medicaid Other | Attending: Nurse Practitioner | Admitting: Nurse Practitioner

## 2021-05-28 ENCOUNTER — Other Ambulatory Visit: Payer: Self-pay

## 2021-05-28 ENCOUNTER — Encounter: Payer: Self-pay | Admitting: Nurse Practitioner

## 2021-05-28 DIAGNOSIS — B9689 Other specified bacterial agents as the cause of diseases classified elsewhere: Secondary | ICD-10-CM

## 2021-05-28 DIAGNOSIS — J329 Chronic sinusitis, unspecified: Secondary | ICD-10-CM | POA: Diagnosis not present

## 2021-05-28 MED ORDER — PROMETHAZINE-DM 6.25-15 MG/5ML PO SYRP: 5.0000 mL | ORAL_SOLUTION | Freq: Four times a day (QID) | ORAL | 0 refills | Status: DC | PRN

## 2021-05-28 MED ORDER — AMOXICILLIN-POT CLAVULANATE 875-125 MG PO TABS: 1.0000 | ORAL_TABLET | Freq: Two times a day (BID) | ORAL | 0 refills | Status: AC

## 2021-05-28 MED ORDER — OXYMETAZOLINE HCL 0.05 % NA SOLN: 1.0000 | Freq: Two times a day (BID) | NASAL | 0 refills | Status: DC

## 2021-05-28 NOTE — Progress Notes (Signed)
Virtual Visit via Telephone Note ?Due to national recommendations of social distancing due to [place] 19, telehealth visit is felt to be most appropriate for this patient at this time.  I discussed the limitations, risks, security and privacy concerns of performing an evaluation and management service by telephone and the availability of in person appointments. I also discussed with the patient that there may be a patient responsible charge related to this service. The patient expressed understanding and agreed to proceed.  ? ? ?I connected with Tonya Wilkerson on 05/28/21  at   1:50 PM EDT  EDT by telephone and verified that I am speaking with the correct person using two identifiers. ? ?Location of Patient: ?Private Residence ?  ?Location of Provider: ?Scientist, research (physical sciences) and [employer] Office  ?  ?Persons participating in Telemedicine visit: ?[name] FNP-BC ?Sheniya Signor  ?  ?History of Present Illness: ?Telemedicine visit for: SINUSITIS ? ?She complains of congestion, cough described as nonproductive, facial pain, headache described as pressure and aching, nasal congestion, purulent nasal discharge, sinus pressure, and sneezing.with no fever, chills, night sweats or weight loss. Onset of symptoms was about a week ago and gradually worsening.She is drinking plenty of fluids.  Past history is significant for asthma and occasional episodes of bronchitis. Patient is non-smoker ? ? ? ? ? ?Past Medical History:  ?Diagnosis Date  ? Abnormal uterine bleeding (AUB)   ? Anemia   ? Anxiety   ? Asthma   ? COVID 03/2020  ? mild cold symptoms x 3 days all symptoms resolved  ? Dyspnea   ? with exertion  ? History of blood transfusion yrs ago  ? after childbirth  ? History of pre-eclampsia 17 yrs ago  ? Hypertension   ? Morbid obesity with BMI of 40.0-44.9, adult ([place])   ? Pneumonia due to infectious organism 2020  ? Sleep apnea dx 2021  ? did not tolerate cpap mild osa   ? Wears glasses   ?  ?Past Surgical History:   ?Procedure Laterality Date  ? CYSTOSCOPY N/A 07/17/2020  ? Procedure: CYSTOSCOPY;  Surgeon: Lavonia Drafts, MD;  Location: [facility];  Service: Gynecology;  Laterality: N/A;  ? ROBOTIC ASSISTED LAPAROSCOPIC HYSTERECTOMY AND SALPINGECTOMY Bilateral 07/17/2020  ? Procedure: XI ROBOTIC ASSISTED TOTAL HYSTERECTOMY WITH BILATERAL SALPINGECTOMY;  Surgeon: Lavonia Drafts, MD;  Location: [facility];  Service: Gynecology;  Laterality: Bilateral;  ? TUBAL LIGATION  17 yrs ago  ?  ?Family History  ?Problem Relation Age of Onset  ? Hypertension Mother   ? Stroke Mother   ? Diabetes Maternal Aunt   ? Cancer Maternal Grandfather   ?  ?Social History  ? ?Socioeconomic History  ? Marital status: Divorced  ?  Spouse name: Not on file  ? Number of children: Not on file  ? Years of education: Not on file  ? Highest education level: Not on file  ?Occupational History  ? Not on file  ?Tobacco Use  ? Smoking status: Former  ?  Types: Cigarettes  ? Smokeless tobacco: Never  ? Tobacco comments:  ?  social smoker   ?Vaping Use  ? Vaping Use: Never used  ?Substance and Sexual Activity  ? Alcohol use: Yes  ?  Comment: daily 1-2 drinks per day  ? Drug use: No  ? Sexual activity: Not Currently  ?  Birth control/protection: Surgical  ?Other Topics Concern  ? Not on file  ?Social History Narrative  ? Not on file  ? ?  Social Determinants of Health  ? ?Financial Resource Strain: Not on file  ?Food Insecurity: Not on file  ?Transportation Needs: Not on file  ?Physical Activity: Not on file  ?Stress: Not on file  ?Social Connections: Not on file  ?  ? ?Observations/Objective: ?Awake, alert and oriented x 3 ? ? ?Review of Systems  ?Constitutional:  Positive for malaise/fatigue. Negative for chills and fever.  ?HENT:  Positive for congestion, sinus pain and sore throat. Negative for ear discharge, ear pain and hearing loss.   ?Eyes: Negative.   ?Respiratory:  Positive for cough. Negative for sputum  production, shortness of breath and wheezing.   ?Cardiovascular: Negative.  Negative for chest pain, orthopnea and leg swelling.  ?Gastrointestinal: Negative.  Negative for abdominal pain, diarrhea, nausea and vomiting.  ?Neurological:  Positive for headaches. Negative for dizziness and focal weakness.  ?Endo/Heme/Allergies:  Negative for environmental allergies.  ?Psychiatric/Behavioral: Negative.     ?Assessment and Plan: ?Diagnoses and all orders for this visit: ? ?Bacterial sinusitis ?-     amoxicillin-clavulanate (AUGMENTIN) 875-125 MG tablet; Take 1 tablet by mouth 2 (two) times daily for 7 days. ?-     oxymetazoline (NASAL SPRAY 12 HOUR) 0.05 % nasal spray; Place 1 spray into both nostrils 2 (two) times daily. ?-     promethazine-dextromethorphan (PROMETHAZINE-DM) 6.25-15 MG/5ML syrup; Take 5 mLs by mouth 4 (four) times daily as needed for cough. ? ?  ? ?Follow Up Instructions ?Return in about 1 week (around 06/04/2021) for HTN.  ? ?  ?I discussed the assessment and treatment plan with the patient. The patient was provided an opportunity to ask questions and all were answered. The patient agreed with the plan and demonstrated an understanding of the instructions. ?  ?The patient was advised to call back or seek an in-person evaluation if the symptoms worsen or if the condition fails to improve as anticipated. ? ?I provided 12 minutes of non-face-to-face time during this encounter including median intraservice time, reviewing previous notes, labs, imaging, medications and explaining diagnosis and management. ? ?Gildardo Pounds, FNP-BC  ?

## 2021-05-29 ENCOUNTER — Other Ambulatory Visit: Payer: Self-pay | Admitting: Nurse Practitioner

## 2021-05-29 DIAGNOSIS — J4520 Mild intermittent asthma, uncomplicated: Secondary | ICD-10-CM

## 2021-05-29 NOTE — Telephone Encounter (Signed)
Requested medication (s) are due for refill today:   Yes ? ?Requested medication (s) are on the active medication list:   Yes ? ?Future visit scheduled:   Yes in 6 days ? ? ?Last ordered: 12/28/2019 8 g, 2 refills ? ?Returned because needs new Rx.    BP out of range per protocol.  ? ?Requested Prescriptions  ?Pending Prescriptions Disp Refills  ? albuterol (VENTOLIN HFA) 108 (90 Base) MCG/ACT inhaler [Pharmacy Med Name: ALBUTEROL HFA INH (200 PUFFS)8.5GM] 8.5 g   ?  Sig: INHALE 2 PUFFS INTO THE LUNGS EVERY 6 HOURS AS NEEDED FOR WHEEZING OR SHORTNESS OF BREATH  ?  ? Pulmonology:  Beta Agonists 2 Failed - 05/29/2021  6:45 AM  ?  ?  Failed - Last BP in normal range  ?  BP Readings from Last 1 Encounters:  ?05/01/21 (!) 142/99  ?  ?  ?  ?  Passed - Last Heart Rate in normal range  ?  Pulse Readings from Last 1 Encounters:  ?05/01/21 87  ?  ?  ?  ?  Passed - Valid encounter within last 12 months  ?  Recent Outpatient Visits   ? ?      ? Yesterday Bacterial sinusitis  ? [place], [place] W, NP  ? 4 weeks ago Essential hypertension  ? [place], [place] W, NP  ? 6 months ago Controlled type 2 diabetes mellitus without complication, without long-term current use of insulin ([place])  ? [place], [place] W, NP  ? 1 year ago Controlled type 2 diabetes mellitus without complication, without long-term current use of insulin ([place])  ? [place], [place] Buff, NP  ? 1 year ago Essential hypertension  ? [place] Gildardo Pounds, NP  ? ?  ?  ?Future Appointments   ? ?        ? In 6 days Gildardo Pounds, NP [place]  ? ?  ? ?  ?  ?  ? ?

## 2021-06-04 ENCOUNTER — Encounter: Payer: Self-pay | Admitting: Nurse Practitioner

## 2021-06-04 ENCOUNTER — Ambulatory Visit: Payer: Medicaid Other | Attending: Nurse Practitioner | Admitting: Nurse Practitioner

## 2021-06-04 ENCOUNTER — Other Ambulatory Visit: Payer: Self-pay

## 2021-06-04 DIAGNOSIS — J40 Bronchitis, not specified as acute or chronic: Secondary | ICD-10-CM

## 2021-06-04 DIAGNOSIS — I10 Essential (primary) hypertension: Secondary | ICD-10-CM | POA: Diagnosis not present

## 2021-06-04 MED ORDER — PREDNISONE 20 MG PO TABS: 40.0000 mg | ORAL_TABLET | Freq: Every day | ORAL | 0 refills | Status: AC

## 2021-06-04 NOTE — Progress Notes (Signed)
Virtual Visit Note ?Due to national recommendations of social distancing due to [place] 19, virtual visit is felt to be most appropriate for this patient at this time.  I discussed the limitations, risks, security and privacy concerns of performing an evaluation and management service by video and the availability of in person appointments. I also discussed with the patient that there may be a patient responsible charge related to this service. The patient expressed understanding and agreed to proceed.  ? ? ?I connected with Tonya Wilkerson on 06/04/21  at   1:50 PM EDT  EDT by VIDEO and verified that I am speaking with the correct person using two identifiers. ? ? ?Location of Patient: ?Private Residence ?  ?Location of Provider: ?Scientist, research (physical sciences) and [employer] Office  ?  ?Persons participating in VIRTUAL visit: ?Tonya Rankins FNP-BC ?Tonya Wilkerson  ?  ?History of Present Illness: ?VIRTUAL visit for: URI and HTN ? ?HTN ?Blood pressure >150/100. She has an URI and is taking sudafed products over the counter. I have instructed her to log her blood pressures 1 week after completing her current treatment for URI. If continues elevated we will need to increase losartan to 100 mg daily.  ?BP Readings from Last 3 Encounters:  ?05/01/21 (!) 142/99  ?12/10/20 (!) 170/119  ?11/27/20 (!) 157/120  ?  ? ?Upper Respiratory Infection: Patient complains of symptoms of a URI. Symptoms include congestion and cough. Onset of symptoms was several days ago, gradually worsening since that time. She also c/o congestion, cough described as productive, nasal congestion, productive cough with  mucoid colored sputum, shortness of breath, sinus pressure, and wheezing Evaluation to date: none ?seen previously and thought to have a bacterial sinusitis. Treatment to date: antibiotics, antihistamines, cough suppressants, decongestants, and nasal steroids. ?  ? ? ? ?Past Medical History:  ?Diagnosis Date  ? Abnormal uterine bleeding (AUB)   ?  Anemia   ? Anxiety   ? Asthma   ? COVID 03/2020  ? mild cold symptoms x 3 days all symptoms resolved  ? Dyspnea   ? with exertion  ? History of blood transfusion yrs ago  ? after childbirth  ? History of pre-eclampsia 17 yrs ago  ? Hypertension   ? Morbid obesity with BMI of 40.0-44.9, adult ([place])   ? Pneumonia due to infectious organism 2020  ? Sleep apnea dx 2021  ? did not tolerate cpap mild osa   ? Wears glasses   ?  ?Past Surgical History:  ?Procedure Laterality Date  ? CYSTOSCOPY N/A 07/17/2020  ? Procedure: CYSTOSCOPY;  Surgeon: Lavonia Drafts, MD;  [facility];  Service: Gynecology;  Laterality: N/A;  ? ROBOTIC ASSISTED LAPAROSCOPIC HYSTERECTOMY AND SALPINGECTOMY Bilateral 07/17/2020  ? Procedure: XI ROBOTIC ASSISTED TOTAL HYSTERECTOMY WITH BILATERAL SALPINGECTOMY;  Surgeon: Lavonia Drafts, MD;  Location: [facility];  Service: Gynecology;  Laterality: Bilateral;  ? TUBAL LIGATION  17 yrs ago  ?  ?Family History  ?Problem Relation Age of Onset  ? Hypertension Mother   ? Stroke Mother   ? Diabetes Maternal Aunt   ? Cancer Maternal Grandfather   ?  ?Social History  ? ?Socioeconomic History  ? Marital status: Divorced  ?  Spouse name: Not on file  ? Number of children: Not on file  ? Years of education: Not on file  ? Highest education level: Not on file  ?Occupational History  ? Not on file  ?Tobacco Use  ? Smoking status: Former  ?  Types: Cigarettes  ? Smokeless tobacco: Never  ? Tobacco comments:  ?  social smoker   ?Vaping Use  ? Vaping Use: Never used  ?Substance and Sexual Activity  ? Alcohol use: Yes  ?  Comment: daily 1-2 drinks per day  ? Drug use: No  ? Sexual activity: Not Currently  ?  Birth control/protection: Surgical  ?Other Topics Concern  ? Not on file  ?Social History Narrative  ? Not on file  ? ?Social Determinants of Health  ? ?Financial Resource Strain: Not on file  ?Food Insecurity: Not on file  ?Transportation Needs: Not on file   ?Physical Activity: Not on file  ?Stress: Not on file  ?Social Connections: Not on file  ?  ? ?Observations/Objective: ?Awake, alert and oriented x 3 ? ? ?Review of Systems  ?Constitutional:  Negative for fever, malaise/fatigue and weight loss.  ?HENT:  Positive for congestion. Negative for nosebleeds.   ?Eyes: Negative.  Negative for blurred vision, double vision and photophobia.  ?Respiratory:  Positive for cough, sputum production, shortness of breath and wheezing.   ?Cardiovascular: Negative.  Negative for chest pain, palpitations and leg swelling.  ?Gastrointestinal: Negative.  Negative for heartburn, nausea and vomiting.  ?Musculoskeletal: Negative.  Negative for myalgias.  ?Neurological: Negative.  Negative for dizziness, focal weakness, seizures and headaches.  ?Psychiatric/Behavioral: Negative.  Negative for suicidal ideas.    ?Assessment and Plan: ?Diagnoses and all orders for this visit: ? ?Essential hypertension ?She will update me on BP readings via mychart in a few weeks ? ?Bronchitis ?-     predniSONE (DELTASONE) 20 MG tablet; Take 2 tablets (40 mg total) by mouth daily with breakfast for 5 days. ? ?  ? ?Follow Up Instructions ?Return for HTN.  ? ?  ?I discussed the assessment and treatment plan with the patient. The patient was provided an opportunity to ask questions and all were answered. The patient agreed with the plan and demonstrated an understanding of the instructions. ?  ?The patient was advised to call back or seek an in-person evaluation if the symptoms worsen or if the condition fails to improve as anticipated. ? ?I provided 12 minutes of face-to-face time during this encounter including median intraservice time, reviewing previous notes, labs, imaging, medications and explaining diagnosis and management. ? ?Gildardo Pounds, FNP-BC  ?

## 2021-06-19 ENCOUNTER — Encounter: Payer: Medicaid Other | Attending: Nurse Practitioner | Admitting: Dietician

## 2021-06-19 ENCOUNTER — Encounter: Payer: Self-pay | Admitting: Dietician

## 2021-06-19 DIAGNOSIS — E119 Type 2 diabetes mellitus without complications: Secondary | ICD-10-CM | POA: Insufficient documentation

## 2021-06-19 DIAGNOSIS — Z713 Dietary counseling and surveillance: Secondary | ICD-10-CM | POA: Insufficient documentation

## 2021-06-19 DIAGNOSIS — Z6841 Body Mass Index (BMI) 40.0 and over, adult: Secondary | ICD-10-CM | POA: Diagnosis not present

## 2021-06-19 NOTE — Progress Notes (Signed)
Diabetes Self-Management Education ? ?Visit Type: First/Initial ? ?Appt. Start Time: 1400 Appt. End Time: 0865 ? ?06/19/2021 ? ?Tonya Wilkerson, identified by name and date of birth, is a 51 y.o. female with a diagnosis of Diabetes: Type 2.  ? ?ASSESSMENT ?Pt reports having to take prednisone multiple times in the past, causing their blood sugar to spike and they have been prescribed metformin for this reason. ?Pt has discontinued taking metformin recently, A1c is stable. ?Pt reports their glucometer is broken, RD provided replacement sample meter. ?Accu-Chek GuideMe ?Lot#: [id number] ?S/N: [id number] ?Exp. Date:07/03/2022 ?Pt reports feeling that their health is poor due to be overweight. Pt reports history of being athletic and skinny. Pt would like to lose weight to lower blood pressure, blood sugar, and quality of life. ?Pt reports being depressed over their weight and health. ?Pt states that they are mostly sedentary, has trouble breathing when being active. Pt job is sedentary. Pt has asthma that interferes with breathing as well. ?Pt reports drinking around 100 oz of water a day. ?Pt reports skipping breakfast at times or will skip lunch. ?Pt reports they get frustrated counting calories, tried weighing foods in the past and  ?Pt reports drinking wine during the week, sometimes 3-4 glasses. ? ?Height '6\' 1"'$  (1.854 m), weight (!) 328 lb 12.8 oz (149.1 kg), last menstrual period 06/15/2020. ?Body mass index is 43.38 kg/m?. ? ? Diabetes Self-Management Education - 06/19/21 1415   ? ?  ? Visit Information  ? Visit Type First/Initial   ?  ? Initial Visit  ? Diabetes Type Type 2   ? Are you currently following a meal plan? No   ? Are you taking your medications as prescribed? No   ? Date Diagnosed 2020   ?  ? Health Coping  ? How would you rate your overall health? Poor   ?  ? Psychosocial Assessment  ? Patient Belief/Attitude about Diabetes Motivated to manage diabetes   ? Self-management support Doctor's office   ?  Other persons present Patient   ? Patient Concerns Weight Control;Nutrition/Meal planning   ? Special Needs None   ? Preferred Learning Style No preference indicated   ? Learning Readiness Ready   ? How often do you need to have someone help you when you read instructions, pamphlets, or other written materials from your doctor or pharmacy? 1 - Never   ? What is the last grade level you completed in school? some college   ?  ? Pre-Education Assessment  ? Patient understands the diabetes disease and treatment process. Needs Instruction   ? Patient understands incorporating nutritional management into lifestyle. Needs Instruction   ? Patient undertands incorporating physical activity into lifestyle. Needs Instruction   ? Patient understands using medications safely. Needs Instruction   ? Patient understands monitoring blood glucose, interpreting and using results Needs Instruction   ? Patient understands prevention, detection, and treatment of acute complications. Needs Instruction   ? Patient understands prevention, detection, and treatment of chronic complications. Needs Instruction   ? Patient understands how to develop strategies to address psychosocial issues. Needs Instruction   ? Patient understands how to develop strategies to promote health/change behavior. Needs Instruction   ?  ? Complications  ? Last HgB A1C per patient/outside source 5.8 %   05/01/2021  ? How often do you check your blood sugar? 0 times/day (not testing)   Pt glucometer is broken  ? Have you had a dilated eye exam in the past 12  months? No   ? Have you had a dental exam in the past 12 months? Yes   ? Are you checking your feet? Yes   ? How many days per week are you checking your feet? 7   ?  ? Dietary Intake  ? Breakfast Salmon, grits w/butter, cup of OJ, 2 bottles of water   ? Lunch 2 Fried pork chop, water   ? Snack (afternoon) handful of canned peanuts, handful of pistachios   ? Dinner 1/2 fried pork chop, 1 roasted pork chop w/ gravy  and onions, rice, water   ? Snack (evening) small portion of rice and gravy, can of soda   ? Beverage(s) water, soda   ?  ? Exercise  ? Exercise Type ADL's   ? How many days per week to you exercise? 1   ? How many minutes per day do you exercise? 20   ? Total minutes per week of exercise 20   ?  ? Patient Education  ? Previous Diabetes Education No   ? Disease state  Factors that contribute to the development of diabetes   ? Nutrition management  Role of diet in the treatment of diabetes and the relationship between the three main macronutrients and blood glucose level;Food label reading, portion sizes and measuring food.;Information on hints to eating out and maintain blood glucose control.   ? Physical activity and exercise  Role of exercise on diabetes management, blood pressure control and cardiac health.   ? Monitoring Purpose and frequency of SMBG.   ? Chronic complications Relationship between chronic complications and blood glucose control   ? Psychosocial adjustment Role of stress on diabetes;Identified and addressed patients feelings and concerns about diabetes;Brainstormed with patient on coping mechanisms for social situations, getting support from significant others, dealing with feelings about diabetes   ? Personal strategies to promote health Lifestyle issues that need to be addressed for better diabetes care   ?  ? Individualized Goals (developed by patient)  ? Nutrition Follow meal plan discussed;General guidelines for healthy choices and portions discussed   ? Medications Not Applicable   ? Monitoring  test my blood glucose as discussed   ?  ? Post-Education Assessment  ? Patient understands the diabetes disease and treatment process. Needs Review   ? Patient understands incorporating nutritional management into lifestyle. Needs Review   ? Patient undertands incorporating physical activity into lifestyle. Needs Review   ? Patient understands using medications safely. Needs Review   ? Patient  understands monitoring blood glucose, interpreting and using results Needs Review   ? Patient understands prevention, detection, and treatment of acute complications. Needs Review   ? Patient understands prevention, detection, and treatment of chronic complications. Needs Review   ? Patient understands how to develop strategies to address psychosocial issues. Needs Review   ? Patient understands how to develop strategies to promote health/change behavior. Needs Review   ?  ? Outcomes  ? Expected Outcomes Demonstrated interest in learning. Expect positive outcomes   ? Future DMSE 4-6 wks   ? Program Status Not Completed   ? ?  ?  ? ?  ? ? ?Individualized Plan for Diabetes Self-Management Training:  ? ?Learning Objective:  Patient will have a greater understanding of diabetes self-management. ?Patient education plan is to attend individual and/or group sessions per assessed needs and concerns. ?  ?Plan:  ? ?Patient Instructions  ?Work towards consistently eating three meals a day, about 5-6 hours apart! ? ?Begin to  recognize carbohydrates, proteins, and non-starchy vegetables in your food choices! ? ?Begin to build your meals using the proportions of the Balanced Plate. ?First, select your carb choice(s) for the meal. Make this 25% of your meal. ?Next, select your source of protein to pair with your carb choice(s). Make this another 25% of your meal. ?Finally, complete your meal with a variety of non-starchy vegetables. Make this the remaining 50% of your meal. ? ? ?Use a combination of these three strategies to effectively lower your consumption of fats ?1) Consume a smaller portion when having high fat foods ?2) Consume high fat foods less frequently ?3) Find a reduced, low, or fat free version ? ?When snacking on peanuts, have half the normal amount of peanuts and add a serving of fruit (baseball size). ?When snacking on PB and graham crackers, always try to spread your PB very thin. ? ?Expected Outcomes:   Demonstrated interest in learning. Expect positive outcomes ? ?Education material provided: My Plate, Food Log, Energy Balance Wroksheet ? ?If problems or questions, patient to contact team via:  Phone and Email ? ?Fut

## 2021-06-19 NOTE — Patient Instructions (Signed)
Work towards consistently eating three meals a day, about 5-6 hours apart! ? ?Begin to recognize carbohydrates, proteins, and non-starchy vegetables in your food choices! ? ?Begin to build your meals using the proportions of the Balanced Plate. ?First, select your carb choice(s) for the meal. Make this 25% of your meal. ?Next, select your source of protein to pair with your carb choice(s). Make this another 25% of your meal. ?Finally, complete your meal with a variety of non-starchy vegetables. Make this the remaining 50% of your meal. ? ? ?Use a combination of these three strategies to effectively lower your consumption of fats ?1) Consume a smaller portion when having high fat foods ?2) Consume high fat foods less frequently ?3) Find a reduced, low, or fat free version ? ?When snacking on peanuts, have half the normal amount of peanuts and add a serving of fruit (baseball size). ?When snacking on PB and graham crackers, always try to spread your PB very thin. ?

## 2021-07-26 ENCOUNTER — Other Ambulatory Visit: Payer: Self-pay | Admitting: Pharmacist

## 2021-07-26 NOTE — Chronic Care Management (AMB) (Signed)
? ?   ? ?Chief Complaint  ?Patient presents with  ? Hypertension  ? ? ?Tonya Wilkerson is a 51 y.o. year old female who was referred for medication management by their primary care provider, Gildardo Pounds, NP. They presented for a telephone visit. ?  ?They were referred to the pharmacist by a quality report for assistance in managing diabetes and hypertension.  ? ?Subjective: ? ?Care Team: ?Primary Care Provider: Gildardo Pounds, NP ; Next Scheduled Visit: none scheduled  ? ?Medication Access/Adherence ? ?Current Pharmacy:  ?[id number] [place] [place] [address] [address] ?Phone: [phone] Fax: [phone] ? ? ?Patient reports affordability concerns with their medications: No  ?Patient reports access/transportation concerns to their pharmacy: No  ?Patient reports adherence concerns with their medications:  No   ? ?Hypertension: ? ?Current medications: amlodipine 10 mg daily, losartan 50 mg daily - but reports she has been taking twice daily. Fill history indicates last filled in 03/2021 for 90 day supply ?Medications previously tried:  ? ?Patient has a validated, automated, upper arm home BP cuff ?Current blood pressure readings readings: 130-140s/90-100s ? ?Hyperlipidemia/ASCVD Risk Reduction ? ?Current lipid lowering medications: prescribed pravastatin 20 mg daily, but not taking. No tolerability concerns, but overwhelmed by the number of pills ? ? ?Health Maintenance ? ?Health Maintenance Due  ?Topic Date Due  ? COVID-19 Vaccine (1) Never done  ? FOOT EXAM  Never done  ? OPHTHALMOLOGY EXAM  Never done  ? COLONOSCOPY (Pts 45-70yr Insurance coverage will need to be confirmed)  Never done  ? Zoster Vaccines- Shingrix (1 of 2) Never done  ?  ? ?Objective: ?Lab Results  ?Component Value Date  ? HGBA1C 5.8 (H) 05/01/2021  ? ? ?Lab Results  ?Component Value Date  ? CREATININE 0.82 05/01/2021  ? BUN 7 05/01/2021  ? NA 143  05/01/2021  ? K 4.4 05/01/2021  ? CL 105 05/01/2021  ? CO2 22 05/01/2021  ? ? ?Lab Results  ?Component Value Date  ? CHOL 200 (H) 05/01/2021  ? HDL 101 05/01/2021  ? [place]85 05/01/2021  ? TRIG 80 05/01/2021  ? CHOLHDL 2.0 05/01/2021  ? ? ?Medications Reviewed Today   ? ? Reviewed by HOsker Mason RPH-CPP (Pharmacist) on 07/26/21 at 0[place]landList Status: <None>  ? ?Medication Order Taking? Sig Documenting Provider Last Dose Status Informant  ?albuterol (VENTOLIN HFA) 108 (90 Base) MCG/ACT inhaler [id number] INHALE 2 PUFFS INTO THE LUNGS EVERY 6 HOURS AS NEEDED FOR WHEEZING OR SHORTNESS OF BREATH  ?Patient not taking: Reported on 07/26/2021  ? NCharlott Rakes MD Not Taking Active   ?amLODipine (NORVASC) 10 MG tablet [id number] Take 1 tablet (10 mg total) by mouth daily. FGildardo Pounds NP Taking Active   ?Blood Glucose Monitoring Suppl (ACCU-CHEK GUIDE) w/Device KIT [id number] 1 each by Does not apply route 2 (two) times daily. FGildardo Pounds NP Taking Active   ?busPIRone (BUSPAR) 15 MG tablet [id number] TAKE 1 TABLET(15 MG) BY MOUTH THREE TIMES DAILY FGildardo Pounds NP Taking Active   ?escitalopram (LEXAPRO) 10 MG tablet [id number] Take 1 tablet (10 mg total) by mouth daily.  ?Patient not taking: Reported on 07/26/2021  ? FGildardo Pounds NP Not Taking Active   ?fluticasone furoate-vilanterol (BREO ELLIPTA) 100-25 MCG/ACT AEPB [id number] Inhale 1 puff into the lungs daily. FGildardo Pounds NP  Taking Active   ?glucose blood (ACCU-CHEK GUIDE) test strip [id number]  Use as instructed. Inject into the skin twice daily E11.65 Gildardo Pounds, NP  Active   ?ibuprofen (ADVIL) 600 MG tablet [id number]  Take 1 tablet (600 mg total) by mouth every 6 (six) hours. Lavonia Drafts, MD  Active   ?ipratropium-albuterol (DUONEB) 0.5-2.5 (3) MG/3ML SOLN [id number]  Take 3 mLs by nebulization every 4 (four) hours as needed. Gildardo Pounds, NP  Active   ?losartan (COZAAR) 50 MG tablet  [id number] Yes Take 1 tablet (50 mg total) by mouth daily. Gildardo Pounds, NP Taking Active   ?         ?Med Note [name], Ellysia Char T   Fri Jul 26, 2021  9:25 AM) Twice daily  ?metFORMIN (GLUCOPHAGE) 500 MG tablet [id number] Yes Take 1 tablet (500 mg total) by mouth daily with breakfast. Gildardo Pounds, NP Taking Active Self  ?oxymetazoline (NASAL SPRAY 12 HOUR) 0.05 % nasal spray [id number] No Place 1 spray into both nostrils 2 (two) times daily.  ?Patient not taking: Reported on 07/26/2021  ? Gildardo Pounds, NP Not Taking Active   ?pravastatin (PRAVACHOL) 20 MG tablet [id number] No Take 1 tablet (20 mg total) by mouth daily.  ?Patient not taking: Reported on 07/26/2021  ? Gildardo Pounds, NP Not Taking Active   ?promethazine-dextromethorphan (PROMETHAZINE-DM) 6.25-15 MG/5ML syrup [id number] No Take 5 mLs by mouth 4 (four) times daily as needed for cough.  ?Patient not taking: Reported on 07/26/2021  ? Gildardo Pounds, NP Not Taking Active   ?pseudoephedrine (SUDAFED) 30 MG tablet [id number] No Take 30 mg by mouth every 4 (four) hours as needed for congestion.  ?Patient not taking: Reported on 07/26/2021  ? [provider] Not Taking Active   ?traZODone (DESYREL) 150 MG tablet [id number] Yes TAKE 1 TABLET(150 MG) BY MOUTH AT BEDTIME  ?Patient taking differently: at bedtime as needed.  ? Gildardo Pounds, NP Taking Active Self  ?         ?Med Note [name], [name]   Fri Jul 26, 2021  9:32 AM)    ? ?  ?  ? ?  ? ? ?Assessment/Plan:  ?Care Plan : Medication Management  ?Updates made by Osker Mason, RPH-CPP since 07/26/2021 12:00 AM  ?  ? ?Problem: Hypertension   ?  ? ?Goal: BP <130/80   ?Note:   ?Current Barriers:  ?Unable to achieve control of hypertension  ? ?Patient Needs: ?Optimized therapy  ? ?Patient Activities: ?Patient will:  ?- check blood pressure daily, document, and provide at future appointments ? ?  ? ? ?Hypertension: ?- Currently uncontrolled ?- Reviewed long term cardiovascular and  renal outcomes of uncontrolled blood pressure ?- Reviewed appropriate blood pressure monitoring technique and reviewed goal blood pressure. Recommended to check home blood pressure and heart rate daily. ?- Recommend to consider change to valsartan or valsartan/HCTZ combo. Patient amenable to scheduling with embedded pharmacist.  ?- Recommend to continue current regimen at this time  ? ?Hyperlipidemia/ASCVD Risk Reduction: ?- Currently uncontrolled.  ?- Reviewed long term complications of uncontrolled cholesterol and indication for statin in diabetes with hypertension ?- Reviewed family history. No family history of premature cardiovascular disease.  ?- Encouraged to contemplate initiation of statin for primary prevention and discuss with Zelda. Could consider stopping metformin given well controlled A1C, as patient does not want to "add" another medication (this would be replacing).  ?  ?Follow Up Plan: embedded pharmacist in  6 weeks.  ? ?Catie Hedwig Morton, PharmD, BCACP ?[place] ?[phone] ? ? ? ?

## 2021-07-26 NOTE — Patient Instructions (Signed)
Tonya Wilkerson,  ? ?It was great talking to you today! ? ?Check your blood pressure once daily, and any time you have concerning symptoms like headache, chest pain, dizziness, shortness of breath, or vision changes.  ? ?Our goal is less than 130/80. ? ?To appropriately check your blood pressure, make sure you do the following:  ?1) Avoid caffeine, exercise, or tobacco products for 30 minutes before checking. Empty your bladder. ?2) Sit with your back supported in a flat-backed chair. Rest your arm on something flat (arm of the chair, table, etc). ?3) Sit still with your feet flat on the floor, resting, for at least 5 minutes.  ?4) Check your blood pressure. Take 1-2 readings.  ?5) Write down these readings and bring with you to any provider appointments. ? ?Bring your home blood pressure machine with you to a provider's office for accuracy comparison at least once a year.  ? ?Make sure you take your blood pressure medications before you come to any office visit, even if you were asked to fast for labs. ? ?Think about starting the pravastatin for prevention of heart attacks/strokes. It is recommended that someone with diabetes be on a statin, and if there are other risk factors (like high blood pressure), we target a LDL (bad cholesterol) of less than 70.  ? ?Take care! ? ?Tonya Wilkerson, PharmD, BCACP ?[place] ?[phone] ? ?Below is transportation information for your Medicaid plan ? ? ?If you are experiencing a medical emergency, please call 911 or report to your local emergency department or urgent care.  ? ?If you have a non-emergency medical problem during routine business hours, please contact your provider's office and ask to speak with a nurse.  ? ?For questions related to your Healthy [facility] health plan, please call: [phone] or visit the homepage here: [email or web address] ? ?If you would like to schedule transportation through your [facility] plan, please call the following number at least 2 days in advance of your appointment: [phone] ? For information about your ride after you set it up, call Ride Assist at [phone]. Use this number to activate a Will Call pickup, or if your transportation is late for a scheduled pickup. Use this number, too, if you need to make a change or cancel a previously scheduled reservation. ? If you need transportation services right away, call [phone]. The after-hours call center is staffed 24 hours to handle ride assistance and urgent reservation requests (including discharges) 365 days a year. Urgent trips include sick visits, hospital discharge requests and life-sustaining treatment. ? ? ?  ?

## 2021-08-20 ENCOUNTER — Ambulatory Visit: Payer: Medicaid Other | Admitting: Dietician

## 2021-08-28 ENCOUNTER — Other Ambulatory Visit: Payer: Self-pay | Admitting: Nurse Practitioner

## 2021-08-28 DIAGNOSIS — F419 Anxiety disorder, unspecified: Secondary | ICD-10-CM

## 2021-09-03 ENCOUNTER — Ambulatory Visit: Payer: Medicaid Other | Admitting: Pharmacist

## 2021-09-06 ENCOUNTER — Other Ambulatory Visit: Payer: Self-pay | Admitting: Pharmacist

## 2021-09-10 ENCOUNTER — Encounter: Payer: Self-pay | Admitting: Physician Assistant

## 2021-09-10 ENCOUNTER — Telehealth: Payer: Medicaid Other | Admitting: Physician Assistant

## 2021-09-10 DIAGNOSIS — I10 Essential (primary) hypertension: Secondary | ICD-10-CM

## 2021-09-18 ENCOUNTER — Ambulatory Visit: Payer: Medicaid Other | Admitting: Registered"

## 2021-10-10 ENCOUNTER — Ambulatory Visit: Payer: Self-pay

## 2021-10-10 ENCOUNTER — Telehealth: Payer: Self-pay | Admitting: Pharmacist

## 2021-10-10 NOTE — Telephone Encounter (Signed)
Attempted to contact patient for scheduled follow up. Unable to leave voicemail. Will send MyChart message.   Catie Hedwig Morton, PharmD, [facility] [phone]

## 2021-10-15 NOTE — Telephone Encounter (Signed)
Contacted patient. Scheduled follow up call for Thursday.

## 2021-11-13 ENCOUNTER — Encounter: Payer: Self-pay | Admitting: Pharmacist

## 2021-11-13 ENCOUNTER — Telehealth: Payer: Self-pay | Admitting: Pharmacist

## 2021-11-13 NOTE — Progress Notes (Signed)
Attempted to contact patient to schedule follow up. Unable to leave voicemail. Will send MyChart.   Catie Hedwig Morton, PharmD, [facility] [phone]

## 2021-11-20 NOTE — Progress Notes (Signed)
Attempted to call patient to schedule follow up. Voicemail is full. Noted that patient read MyChart message from the other day.   Catie Hedwig Morton, PharmD, [facility] [phone]

## 2022-02-15 ENCOUNTER — Other Ambulatory Visit: Payer: Self-pay | Admitting: Nurse Practitioner

## 2022-02-17 NOTE — Telephone Encounter (Signed)
Requested Prescriptions  Pending Prescriptions Disp Refills   pravastatin (PRAVACHOL) 20 MG tablet [Pharmacy Med Name: PRAVASTATIN '20MG'$  TABLETS] 90 tablet 0    Sig: TAKE 1 TABLET(20 MG) BY MOUTH DAILY     Cardiovascular:  Antilipid - Statins Failed - 02/15/2022  6:23 AM      Failed - Lipid Panel in normal range within the last 12 months    Cholesterol, Total  Date Value Ref Range Status  05/01/2021 200 (H) 100 - 199 mg/dL Final   LDL Chol Calc (NIH)  Date Value Ref Range Status  05/01/2021 85 0 - 99 mg/dL Final   HDL  Date Value Ref Range Status  05/01/2021 101 >39 mg/dL Final   Triglycerides  Date Value Ref Range Status  05/01/2021 80 0 - 149 mg/dL Final         Passed - Patient is not pregnant      Passed - Valid encounter within last 12 months    Recent Outpatient Visits           8 months ago Essential hypertension   [place], Vernia Buff, NP   8 months ago Bacterial sinusitis   [place], Vernia Buff, NP   9 months ago Essential hypertension   [place], [place] W, NP   1 year ago Controlled type 2 diabetes mellitus without complication, without long-term current use of insulin [facility])   [place], [place] W, NP   1 year ago Controlled type 2 diabetes mellitus without complication, without long-term current use of insulin [facility])   [place], Vernia Buff, NP

## 2022-02-22 DIAGNOSIS — Z7984 Long term (current) use of oral hypoglycemic drugs: Secondary | ICD-10-CM | POA: Diagnosis not present

## 2022-02-22 DIAGNOSIS — J069 Acute upper respiratory infection, unspecified: Secondary | ICD-10-CM | POA: Diagnosis not present

## 2022-02-22 DIAGNOSIS — Z87891 Personal history of nicotine dependence: Secondary | ICD-10-CM | POA: Diagnosis not present

## 2022-02-22 DIAGNOSIS — Z79899 Other long term (current) drug therapy: Secondary | ICD-10-CM | POA: Diagnosis not present

## 2022-02-22 DIAGNOSIS — R079 Chest pain, unspecified: Secondary | ICD-10-CM | POA: Diagnosis not present

## 2022-02-22 DIAGNOSIS — R059 Cough, unspecified: Secondary | ICD-10-CM | POA: Diagnosis not present

## 2022-02-22 DIAGNOSIS — R0602 Shortness of breath: Secondary | ICD-10-CM | POA: Diagnosis not present

## 2022-02-22 DIAGNOSIS — R0789 Other chest pain: Secondary | ICD-10-CM | POA: Diagnosis not present

## 2022-02-22 DIAGNOSIS — I10 Essential (primary) hypertension: Secondary | ICD-10-CM | POA: Diagnosis not present

## 2022-02-25 ENCOUNTER — Telehealth: Payer: Self-pay | Admitting: *Deleted

## 2022-02-25 NOTE — Patient Outreach (Signed)
  Care Coordination [facility] Note Transition Care Management Follow-up Telephone Call Date of discharge and from where: 02/22/22 from [facility] ED How have you been since you were released from the hospital? "I am doing much better" Any questions or concerns? No  Items Reviewed: Did the pt receive and understand the discharge instructions provided? Yes  Medications obtained and verified? Yes  Other? No  Any new allergies since your discharge? No  Dietary orders reviewed? No Do you have support at home? Yes   Home Care and Equipment/Supplies: Were home health services ordered? no If so, what is the name of the agency? N/A  Has the agency set up a time to come to the patient's home? not applicable Were any new equipment or medical supplies ordered?  No What is the name of the medical supply agency? N/A Were you able to get the supplies/equipment? not applicable Do you have any questions related to the use of the equipment or supplies? No  Functional Questionnaire: (I = Independent and D = Dependent) ADLs: i  Bathing/Dressing- I  Meal Prep- I  Eating- I  Maintaining continence- I  Transferring/Ambulation- I  Managing Meds- I  Follow up appointments reviewed:  PCP Hospital f/u appt confirmed?  N/A-ED visit   Specialist Hospital f/u appt confirmed? No   Are transportation arrangements needed? No  If their condition worsens, is the pt aware to call PCP or go to the Emergency Dept.? Yes Was the patient provided with contact information for the PCP's office or ED? No Was to pt encouraged to call back with questions or concerns? Yes   RNCM provided information for Case Management services provided by the Managed Medicaid Care Team. Patient consents to services. An initial telephone outreach was scheduled for 03/11/22 at [place] RN, [address] RN Care Coordinator

## 2022-03-11 ENCOUNTER — Other Ambulatory Visit: Payer: Self-pay | Admitting: Nurse Practitioner

## 2022-03-11 ENCOUNTER — Encounter: Payer: Self-pay | Admitting: Nurse Practitioner

## 2022-03-11 ENCOUNTER — Other Ambulatory Visit: Payer: Medicaid Other | Admitting: *Deleted

## 2022-03-11 ENCOUNTER — Encounter: Payer: Self-pay | Admitting: *Deleted

## 2022-03-11 DIAGNOSIS — B9689 Other specified bacterial agents as the cause of diseases classified elsewhere: Secondary | ICD-10-CM

## 2022-03-11 DIAGNOSIS — J4520 Mild intermittent asthma, uncomplicated: Secondary | ICD-10-CM

## 2022-03-11 MED ORDER — PROMETHAZINE-DM 6.25-15 MG/5ML PO SYRP: 5.0000 mL | ORAL_SOLUTION | Freq: Four times a day (QID) | ORAL | 0 refills | Status: DC | PRN

## 2022-03-11 MED ORDER — MOMETASONE FURO-FORMOTEROL FUM 100-5 MCG/ACT IN AERO: 2.0000 | INHALATION_SPRAY | Freq: Two times a day (BID) | RESPIRATORY_TRACT | 6 refills | Status: AC

## 2022-03-11 MED ORDER — PREDNISONE 20 MG PO TABS: 40.0000 mg | ORAL_TABLET | Freq: Every day | ORAL | 0 refills | Status: AC

## 2022-03-11 MED ORDER — VENTOLIN HFA 108 (90 BASE) MCG/ACT IN AERS: 1.0000 | INHALATION_SPRAY | RESPIRATORY_TRACT | 2 refills | Status: AC | PRN

## 2022-03-11 MED ORDER — AZITHROMYCIN 250 MG PO TABS: ORAL_TABLET | ORAL | 0 refills | Status: AC

## 2022-03-11 MED ORDER — IPRATROPIUM-ALBUTEROL 0.5-2.5 (3) MG/3ML IN SOLN: 3.0000 mL | RESPIRATORY_TRACT | 3 refills | Status: AC | PRN

## 2022-03-11 NOTE — Patient Instructions (Signed)
Visit Information  Tonya Wilkerson was given information about Medicaid Managed Care team care coordination services as a part of their Healthy [facility] Medicaid benefit. Tonya Wilkerson verbally consented to engagement with the [facility] Managed Care team.   If you are experiencing a medical emergency, please call 911 or report to your local emergency department or urgent care.   If you have a non-emergency medical problem during routine business hours, please contact your provider's office and ask to speak with a nurse.   For questions related to your Healthy [facility] health plan, please call: [phone] or visit the homepage here: [email or web address]  If you would like to schedule transportation through your Healthy [facility] plan, please call the following number at least 2 days in advance of your appointment: [phone]  For information about your ride after you set it up, call Ride Assist at [phone]. Use this number to activate a Will Call pickup, or if your transportation is late for a scheduled pickup. Use this number, too, if you need to make a change or cancel a previously scheduled reservation.  If you need transportation services right away, call [phone]. The after-hours call center is staffed 24 hours to handle ride assistance and urgent reservation requests (including discharges) 365 days a year. Urgent trips include sick visits, hospital discharge requests and life-sustaining treatment.  Call the [facility] at [phone], at any time, 24 hours a day, 7 days a week. If you are in danger or need immediate medical attention call 911.  If you would like help to quit smoking, call 1-800-QUIT-NOW 3674408405) OR Espaol: 1-855-Djelo-Ya (1-517-616-0737) o para ms informacin haga clic aqu or Text READY to 200-400 to register via text  Tonya Wilkerson,   Please see education materials related to HTN provided by MyChart  link.  Patient verbalizes understanding of instructions and care plan provided today and agrees to view in [place]. Active MyChart status and patient understanding of how to access instructions and care plan via MyChart confirmed with patient.     Telephone follow up appointment with Managed Medicaid care management team member scheduled for:03/25/22 @ 1:15pm  Tonya Joiner RN, BSN [place] RN Care Coordinator   Following is a copy of your plan of care:  Care Plan : RN Care Manager Plan of Care  Updates made by Melissa Montane, RN since 03/11/2022 12:00 AM     Problem: Health Management needs related to HTN      Long-Range Goal: Development of Plan of Care to address Health Management needs related to HTN   Start Date: 03/11/2022  Expected End Date: 06/09/2022  Priority: High  Note:   Current Barriers:  Chronic Disease Management support and education needs related to HTN  RNCM Clinical Goal(s):  Patient will verbalize understanding of plan for management of HTN as evidenced by patient reports take all medications exactly as prescribed and will call provider for medication related questions as evidenced by patient reports and EMR documentation    attend all scheduled medical appointments: schedule Annual Physical with PCP as evidenced by provider documentation          Interventions: Inter-disciplinary care team collaboration (see longitudinal plan of care) Evaluation of current treatment plan related to  self management and patient's adherence to plan as established by provider   Hypertension: (Status: New goal.) Long Term Goal  Last practice recorded BP readings:  BP Readings from Last 3 Encounters:  05/01/21 (!) 142/99  12/10/20 Marland Kitchen)  170/119  11/27/20 (!) 157/120  Most recent eGFR/CrCl:  Lab Results  Component Value Date   EGFR 87 05/01/2021    No components found for: "CRCL"  Evaluation of current treatment plan related to hypertension  self management and patient's adherence to plan as established by provider;   Provided education to patient re: stroke prevention, s/s of heart attack and stroke; Reviewed prescribed diet heart healthy Reviewed medications with patient and discussed importance of compliance;  Counseled on the importance of exercise goals with target of 150 minutes per week Discussed plans with patient for ongoing care management follow up and provided patient with direct contact information for care management team; Advised patient, providing education and rationale, to monitor blood pressure daily and record, calling PCP for findings outside established parameters;  Screening for signs and symptoms of depression related to chronic disease state;  Assessed social determinant of health barriers;  Advised patient to check BP at home and record, take record to next PCP visit  Patient Goals/Self-Care Activities: Take medications as prescribed   Attend all scheduled provider appointments Call provider office for new concerns or questions  Check BP at home and record

## 2022-03-11 NOTE — Patient Outreach (Signed)
Medicaid Managed Care   Nurse Care Manager Note  03/11/2022 Name:  Tonya Wilkerson MRN:  [id number] DOB:  10-Feb-1971  Tonya Wilkerson is an 51 y.o. year old female who is a primary patient of Gildardo Pounds, NP.  The [facility] Managed Care Coordination team was consulted for assistance with:    HTN  Tonya Wilkerson was given information about Medicaid Managed Care Coordination team services today. Tonya Wilkerson Patient agreed to services and verbal consent obtained.  Engaged with patient by telephone for initial visit in response to provider referral for case management and/or care coordination services.   Assessments/Interventions:  Review of past medical history, allergies, medications, health status, including review of consultants reports, laboratory and other test data, was performed as part of comprehensive evaluation and provision of chronic care management services.  SDOH (Social Determinants of Health) assessments and interventions performed: SDOH Interventions    Flowsheet Row Patient Outreach Telephone from 03/11/2022 in [place] Telephone from 02/25/2022 in [place] Interventions Intervention Not Indicated --  Housing Interventions Intervention Not Indicated --  Transportation Interventions Intervention Not Indicated Intervention Not Indicated       Care Plan  No Known Allergies  Medications Reviewed Today     Reviewed by Tonya Montane, RN (Registered Nurse) on 03/11/22 at 35  Med List Status: <None>   Medication Order Taking? Sig Documenting Provider Last Dose Status Informant  albuterol (VENTOLIN HFA) 108 (90 Base) MCG/ACT inhaler [id number] Yes INHALE 2 PUFFS INTO THE LUNGS EVERY 6 HOURS AS NEEDED FOR WHEEZING OR SHORTNESS OF Red Wilkerson, Enobong, MD Taking Active   amLODipine (NORVASC) 10 MG tablet [id number] Yes Take 1 tablet (10 mg total) by mouth daily. Gildardo Pounds, NP Taking Active   Blood Glucose Monitoring Suppl (ACCU-CHEK GUIDE) w/Device KIT [id number] Yes 1 each by Does not apply route 2 (two) times daily. Gildardo Pounds, NP Taking Active   busPIRone (BUSPAR) 15 MG tablet [id number] Yes TAKE 1 TABLET(15 MG) BY MOUTH THREE TIMES DAILY Tonya Rakes, MD Taking Active   glucose blood (ACCU-CHEK GUIDE) test strip [id number] Yes Use as instructed. Inject into the skin twice daily E11.65 Gildardo Pounds, NP Taking Active   HYDROcodone bit-homatropine (HYDROMET) 5-1.5 MG/5ML syrup [id number] No Take 5 mLs by mouth every 6 (six) hours as needed for cough.  Patient not taking: Reported on 03/11/2022   [provider] Not Taking Active   ibuprofen (ADVIL) 600 MG tablet [id number] Yes Take 1 tablet (600 mg total) by mouth every 6 (six) hours. Lavonia Drafts, MD Taking Active            Med Note (Tonya Wilkerson, Kynlie A   Tue Mar 11, 2022  1:11 PM) As needed  ipratropium-albuterol (DUONEB) 0.5-2.5 (3) MG/3ML SOLN [id number] Yes Take 3 mLs by nebulization every 4 (four) hours as needed. Gildardo Pounds, NP Taking Active   losartan (COZAAR) 50 MG tablet [id number] Yes Take 1 tablet (50 mg total) by mouth daily. Gildardo Pounds, NP Taking Active            Med Note Jodi Wilkerson, Tonya Bushy   Fri Sep 06, 2021 10:01 AM) Twice daily  metFORMIN (GLUCOPHAGE) 500 MG tablet [id number] No Take 1 tablet (500 mg total) by mouth daily with breakfast.  Patient not taking: Reported on 02/25/2022   Gildardo Pounds, NP Not Taking Active Self  oxymetazoline (NASAL SPRAY 12 HOUR) 0.05 %  nasal spray [id number] No Place 1 spray into both nostrils 2 (two) times daily.  Patient not taking: Reported on 03/11/2022   Gildardo Pounds, NP Not Taking Active   pravastatin (PRAVACHOL) 20 MG tablet [id number] Yes TAKE 1 TABLET(20 MG) BY MOUTH DAILY Gildardo Pounds, NP Taking Active   promethazine-dextromethorphan (PROMETHAZINE-DM) 6.25-15 MG/5ML syrup [id number] No Take 5 mLs by mouth 4  (four) times daily as needed for cough.  Patient not taking: Reported on 07/26/2021   Gildardo Pounds, NP Not Taking Active   pseudoephedrine (SUDAFED) 30 MG tablet [id number] Yes Take 30 mg by mouth every 4 (four) hours as needed for congestion. 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   Asthma 10/21/2020   Type 2 diabetes mellitus without complication, without long-term current use of insulin ([place]) 05/25/2018   Adjustment disorder with mixed anxiety and depressed mood 02/02/2018   Essential hypertension 04/21/2017   Other insomnia 04/21/2017   Hematuria 10/13/2016   Hyperbilirubinemia 10/13/2016   Morbid obesity with BMI of 40.0-44.9, adult ([place]) 10/13/2016   Iron deficiency anemia due to chronic blood loss 04/22/2016    Conditions to be addressed/monitored per PCP order:  HTN  Care Plan : RN Care Manager Plan of Care  Updates made by Tonya Montane, RN since 03/11/2022 12:00 AM     Problem: Health Management needs related to HTN      Long-Range Goal: Development of Plan of Care to address Health Management needs related to HTN   Start Date: 03/11/2022  Expected End Date: 06/09/2022  Priority: High  Note:   Current Barriers:  Chronic Disease Management support and education needs related to HTN  RNCM Clinical Goal(s):  Patient will verbalize understanding of plan for management of HTN as evidenced by patient reports take all medications exactly as prescribed and will call provider for medication related questions as evidenced by patient reports and EMR documentation    attend all scheduled medical appointments: schedule Annual Physical  with PCP as evidenced by provider documentation          Interventions: Inter-disciplinary care team collaboration (see longitudinal plan of care) Evaluation of current treatment plan related to  self management and patient's adherence to plan as established by provider   Hypertension: (Status: New goal.) Long Term Goal  Last practice recorded BP readings:  BP Readings from Last 3 Encounters:  05/01/21 (!) 142/99  12/10/20 (!) 170/119  11/27/20 (!) 157/120  Most recent eGFR/CrCl:  Lab Results  Component Value Date   EGFR 87 05/01/2021    No components found for: "CRCL"  Evaluation of current treatment plan related to hypertension self management and patient's adherence to plan as established by provider;   Provided education to patient re: stroke prevention, s/s of heart attack and stroke; Reviewed prescribed diet heart healthy Reviewed medications with patient and discussed importance of compliance;  Counseled on the importance of exercise goals with target of 150 minutes per week Discussed plans with patient for ongoing care management follow up  and provided patient with direct contact information for care management team; Advised patient, providing education and rationale, to monitor blood pressure daily and record, calling PCP for findings outside established parameters;  Screening for signs and symptoms of depression related to chronic disease state;  Assessed social determinant of health barriers;  Advised patient to check BP at home and record, take record to next PCP visit  Patient Goals/Self-Care Activities: Take medications as prescribed   Attend all scheduled provider appointments Call provider office for new concerns or questions  Check BP at home and record       Follow Up:  Patient agrees to Care Plan and Follow-up.  Plan: The Managed Medicaid care management team will reach out to the patient again over the next 30 days.  Date/time of next scheduled RN care  management/care coordination outreach:  03/25/22 @ 1:15pm  Lurena Joiner RN, BSN [place]  [employer] manager

## 2022-03-25 ENCOUNTER — Ambulatory Visit: Payer: Medicaid Other | Admitting: *Deleted

## 2022-04-07 ENCOUNTER — Other Ambulatory Visit: Payer: Medicaid Other | Admitting: *Deleted

## 2022-04-07 NOTE — Patient Outreach (Signed)
Medicaid Managed Care   Nurse Care Manager Note  04/07/2022 Name:  Tonya Wilkerson MRN:  [id number] DOB:  1970/12/13  Tonya Wilkerson is an 52 y.o. year old female who is a primary patient of Gildardo Pounds, NP.  The [facility] Managed Care Coordination team was consulted for assistance with:    HTN  Ms. Nakayama was given information about Medicaid Managed Care Coordination team services today. [name] Patient agreed to services and verbal consent obtained.  Engaged with patient by telephone for follow up visit in response to provider referral for case management and/or care coordination services.   Assessments/Interventions:  Review of past medical history, allergies, medications, health status, including review of consultants reports, laboratory and other test data, was performed as part of comprehensive evaluation and provision of chronic care management services.  SDOH (Social Determinants of Health) assessments and interventions performed: SDOH Interventions    Flowsheet Row Patient Outreach Telephone from 03/11/2022 in [place] Telephone from 02/25/2022 in [place] Interventions Intervention Not Indicated --  Housing Interventions Intervention Not Indicated --  Transportation Interventions Intervention Not Indicated Intervention Not Indicated       Care Plan  No Known Allergies  Medications Reviewed Today     Reviewed by Melissa Montane, RN (Registered Nurse) on 04/07/22 at [place] List Status: <None>   Medication Order Taking? Sig Documenting Provider Last Dose Status Informant  albuterol (VENTOLIN HFA) 108 (90 Base) MCG/ACT inhaler [id number] Yes Inhale 1-2 puffs into the lungs every 4 (four) hours as needed for wheezing or shortness of breath. Gildardo Pounds, NP Taking Active   amLODipine (NORVASC) 10 MG tablet [id number] Yes Take 1 tablet (10 mg total) by mouth daily.  Gildardo Pounds, NP Taking Active   Blood Glucose Monitoring Suppl (ACCU-CHEK GUIDE) w/Device KIT [id number]  1 each by Does not apply route 2 (two) times daily. Gildardo Pounds, NP  Active   busPIRone (BUSPAR) 15 MG tablet [id number] Yes TAKE 1 TABLET(15 MG) BY MOUTH THREE TIMES DAILY Charlott Rakes, MD Taking Active   glucose blood (ACCU-CHEK GUIDE) test strip [id number]  Use as instructed. Inject into the skin twice daily E11.65 Gildardo Pounds, NP  Active   ibuprofen (ADVIL) 600 MG tablet [id number] Yes Take 1 tablet (600 mg total) by mouth every 6 (six) hours. Lavonia Drafts, MD Taking Active            Med Note (Yousaf Sainato, Akeia A   Tue Mar 11, 2022  1:11 PM) As needed  ipratropium-albuterol (DUONEB) 0.5-2.5 (3) MG/3ML SOLN [id number] Yes Take 3 mLs by nebulization every 4 (four) hours as needed. Gildardo Pounds, NP Taking Active   losartan (COZAAR) 50 MG tablet [id number] Yes Take 1 tablet (50 mg total) by mouth daily. Gildardo Pounds, NP Taking Active            Med Note [name], [name]   Fri Sep 06, 2021 10:01 AM) Twice daily  metFORMIN (GLUCOPHAGE) 500 MG tablet [id number] No Take 1 tablet (500 mg total) by mouth daily with breakfast.  Patient not taking: Reported on 02/25/2022   Gildardo Pounds, NP Not Taking Active Self  mometasone-formoterol [facility]) 100-5 MCG/ACT Hollie Salk [id number] Yes Inhale 2 puffs into the lungs 2 (two) times daily. Gildardo Pounds, NP Taking Active   oxymetazoline (NASAL SPRAY 12 HOUR) 0.05 % nasal spray [id number] No Place 1 spray into both  nostrils 2 (two) times daily.  Patient not taking: Reported on 03/11/2022   Gildardo Pounds, NP Not Taking Active   pravastatin (PRAVACHOL) 20 MG tablet [id number] Yes TAKE 1 TABLET(20 MG) BY MOUTH DAILY Gildardo Pounds, NP Taking Active   promethazine-dextromethorphan (PROMETHAZINE-DM) 6.25-15 MG/5ML syrup [id number] Yes Take 5 mLs by mouth 4 (four) times daily as needed for cough. Gildardo Pounds, NP Taking Active    pseudoephedrine (SUDAFED) 30 MG tablet [id number] Yes Take 30 mg by mouth every 4 (four) hours as needed for congestion. 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   Asthma 10/21/2020   Type 2 diabetes mellitus without complication, without long-term current use of insulin ([place]) 05/25/2018   Adjustment disorder with mixed anxiety and depressed mood 02/02/2018   Essential hypertension 04/21/2017   Other insomnia 04/21/2017   Hematuria 10/13/2016   Hyperbilirubinemia 10/13/2016   Morbid obesity with BMI of 40.0-44.9, adult ([place]) 10/13/2016   Iron deficiency anemia due to chronic blood loss 04/22/2016    Conditions to be addressed/monitored per PCP order:  HTN  Care Plan : RN Care Manager Plan of Care  Updates made by Melissa Montane, RN since 04/07/2022 12:00 AM     Problem: Health Management needs related to HTN      Long-Range Goal: Development of Plan of Care to address Health Management needs related to HTN   Start Date: 03/11/2022  Expected End Date: 06/09/2022  Priority: High  Note:   Current Barriers:  Chronic Disease Management support and education needs related to HTN-Ms. Ryden has not been feeling well due to sinus issues and has not been checking her BP at home. She is working today and will call to schedule with PCP later this week.  RNCM Clinical Goal(s):  Patient will verbalize understanding of plan for management of HTN as evidenced by patient reports take all medications exactly as prescribed and will call provider for medication related questions as evidenced by patient reports and EMR documentation    attend all scheduled  medical appointments: schedule Annual Physical with PCP as evidenced by provider documentation          Interventions: Inter-disciplinary care team collaboration (see longitudinal plan of care) Evaluation of current treatment plan related to  self management and patient's adherence to plan as established by provider   Hypertension: (Status: Goal on track: NO.) Long Term Goal  Last practice recorded BP readings:  BP Readings from Last 3 Encounters:  05/01/21 (!) 142/99  12/10/20 (!) 170/119  11/27/20 (!) 157/120  Most recent eGFR/CrCl:  Lab Results  Component Value Date   EGFR 87 05/01/2021    No components found for: "CRCL"  Provided education to patient re: stroke prevention, s/s of heart attack and stroke; Reviewed medications with patient and discussed importance of compliance;  Counseled on the importance of exercise goals with target of 150 minutes per week Discussed plans with patient for ongoing care management follow up and provided patient with direct contact information for care management  team; Advised patient, providing education and rationale, to monitor blood pressure daily and record, calling PCP for findings outside established parameters;  Provided education on prescribed diet heart healthy;  Advised patient to check BP at home and record at least 3 times a week, take record to next PCP visit  Patient Goals/Self-Care Activities: Take medications as prescribed   Attend all scheduled provider appointments Call provider office for new concerns or questions  Check BP at home and record       Follow Up:  Patient agrees to Care Plan and Follow-up.  Plan: The Managed Medicaid care management team will reach out to the patient again over the next 30 days.  Date/time of next scheduled RN care management/care coordination outreach:  05/07/22 @ 1:15pm  Lurena Joiner RN, BSN [place]  [employer] manager

## 2022-04-07 NOTE — Patient Instructions (Signed)
Visit Information  Tonya Wilkerson was given information about Medicaid Managed Care team care coordination services as a part of their Healthy [facility] Medicaid benefit. Tonya Wilkerson verbally consented to engagement with the [facility] Managed Care team.   If you are experiencing a medical emergency, please call 911 or report to your local emergency department or urgent care.   If you have a non-emergency medical problem during routine business hours, please contact your provider's office and ask to speak with a nurse.   For questions related to your Healthy [facility] health plan, please call: [phone] or visit the homepage here: [email or web address]  If you would like to schedule transportation through your Healthy [employer] plan, please call the following number at least 2 days in advance of your appointment: [phone]  For information about your ride after you set it up, call Ride Assist at [phone]. Use this number to activate a Will Call pickup, or if your transportation is late for a scheduled pickup. Use this number, too, if you need to make a change or cancel a previously scheduled reservation.  If you need transportation services right away, call [phone]. The after-hours call center is staffed 24 hours to handle ride assistance and urgent reservation requests (including discharges) 365 days a year. Urgent trips include sick visits, hospital discharge requests and life-sustaining treatment.  Call the [place] at [phone], at any time, 24 hours a day, 7 days a week. If you are in danger or need immediate medical attention call 911.  If you would like help to quit smoking, call 1-800-QUIT-NOW (214) 261-8188) OR Espaol: 1-855-Djelo-Ya (6-073-710-6269) o para ms informacin haga clic aqu or Text READY to 200-400 to register via text  Tonya Wilkerson,   Please see education materials related to Heart healthy diet provided  by MyChart link.  Patient verbalizes understanding of instructions and care plan provided today and agrees to view in [place]. Active MyChart status and patient understanding of how to access instructions and care plan via MyChart confirmed with patient.     Telephone follow up appointment with Managed Medicaid care management team member scheduled for:05/07/22 @ 1:15pm  Tonya Joiner RN, [place] RN Care Coordinator [phone]   Following is a copy of your plan of care:  Care Plan : RN Care Manager Plan of Care  Updates made by Melissa Montane, RN since 04/07/2022 12:00 AM     Problem: Health Management needs related to HTN      Long-Range Goal: Development of Plan of Care to address Health Management needs related to HTN   Start Date: 03/11/2022  Expected End Date: 06/09/2022  Priority: High  Note:   Current Barriers:  Chronic Disease Management support and education needs related to HTN-Tonya Wilkerson has not been feeling well due to sinus issues and has not been checking her BP at home. She is working today and will call to schedule with PCP later this week.  RNCM Clinical Goal(s):  Patient will verbalize understanding of plan for management of HTN as evidenced by patient reports take all medications exactly as prescribed and will call provider for medication related questions as evidenced by patient reports and EMR documentation    attend all scheduled medical appointments: schedule Annual Physical with PCP as evidenced by provider documentation          Interventions: Inter-disciplinary care team collaboration (see longitudinal plan of care) Evaluation of current treatment plan related to  self management and patient's  adherence to plan as established by provider   Hypertension: (Status: Goal on track: NO.) Long Term Goal  Last practice recorded BP readings:  BP Readings from Last 3 Encounters:  05/01/21 (!) 142/99  12/10/20 (!) 170/119   11/27/20 (!) 157/120  Most recent eGFR/CrCl:  Lab Results  Component Value Date   EGFR 87 05/01/2021    No components found for: "CRCL"  Provided education to patient re: stroke prevention, s/s of heart attack and stroke; Reviewed medications with patient and discussed importance of compliance;  Counseled on the importance of exercise goals with target of 150 minutes per week Discussed plans with patient for ongoing care management follow up and provided patient with direct contact information for care management team; Advised patient, providing education and rationale, to monitor blood pressure daily and record, calling PCP for findings outside established parameters;  Provided education on prescribed diet heart healthy;  Advised patient to check BP at home and record at least 3 times a week, take record to next PCP visit  Patient Goals/Self-Care Activities: Take medications as prescribed   Attend all scheduled provider appointments Call provider office for new concerns or questions  Check BP at home and record

## 2022-04-14 ENCOUNTER — Other Ambulatory Visit: Payer: Self-pay | Admitting: Nurse Practitioner

## 2022-04-14 DIAGNOSIS — Z1231 Encounter for screening mammogram for malignant neoplasm of breast: Secondary | ICD-10-CM

## 2022-05-07 ENCOUNTER — Ambulatory Visit: Payer: Medicaid Other | Admitting: *Deleted

## 2022-05-14 ENCOUNTER — Encounter: Payer: Self-pay | Admitting: *Deleted

## 2022-05-14 ENCOUNTER — Other Ambulatory Visit: Payer: Medicaid Other | Admitting: *Deleted

## 2022-05-14 NOTE — Patient Instructions (Signed)
Visit Information  Ms. Robin was given information about Medicaid Managed Care team care coordination services as a part of their Healthy [facility] Medicaid benefit. Daina Colberg verbally consented to engagement with the [facility] Managed Care team.   If you are experiencing a medical emergency, please call 911 or report to your local emergency department or urgent care.   If you have a non-emergency medical problem during routine business hours, please contact your provider's office and ask to speak with a nurse.   For questions related to your Healthy [facility] health plan, please call: [phone] or visit the homepage here: [email or web address]  If you would like to schedule transportation through your Healthy [facility] plan, please call the following number at least 2 days in advance of your appointment: [phone]  For information about your ride after you set it up, call Ride Assist at [phone]. Use this number to activate a Will Call pickup, or if your transportation is late for a scheduled pickup. Use this number, too, if you need to make a change or cancel a previously scheduled reservation.  If you need transportation services right away, call [phone]. The after-hours call center is staffed 24 hours to handle ride assistance and urgent reservation requests (including discharges) 365 days a year. Urgent trips include sick visits, hospital discharge requests and life-sustaining treatment.  Call the [place] at [phone], at any time, 24 hours a day, 7 days a week. If you are in danger or need immediate medical attention call 911.  If you would like help to quit smoking, call 1-800-QUIT-NOW 213-298-1807) OR Espaol: 1-855-Djelo-Ya HD:1601594) o para ms informacin haga clic aqu or Text READY to 200-400 to register via text  Ms. Monette,   Please see education materials related to HTN provided by MyChart  link.  Patient verbalizes understanding of instructions and care plan provided today and agrees to view in [place]. Active MyChart status and patient understanding of how to access instructions and care plan via MyChart confirmed with patient.     Telephone follow up appointment with Managed Medicaid care management team member scheduled for:06/13/22 @ 11:15am  Lurena Joiner RN, BSN [place] RN Care Coordinator   Following is a copy of your plan of care:  Care Plan : RN Care Manager Plan of Care  Updates made by Melissa Montane, RN since 05/14/2022 12:00 AM     Problem: Health Management needs related to HTN      Long-Range Goal: Development of Plan of Care to address Health Management needs related to HTN   Start Date: 03/11/2022  Expected End Date: 07/15/2022  Priority: High  Note:   Current Barriers:  Chronic Disease Management support and education needs related to HTN-Ms. Arney has not been feeling well due to sinus issues and has not been checking her BP at home. She is working today and will call to schedule with PCP later this week.  RNCM Clinical Goal(s):  Patient will verbalize understanding of plan for management of HTN as evidenced by patient reports take all medications exactly as prescribed and will call provider for medication related questions as evidenced by patient reports and EMR documentation    attend all scheduled medical appointments: PCP on 05/16/22 as evidenced by provider documentation          Interventions: Inter-disciplinary care team collaboration (see longitudinal plan of care) Evaluation of current treatment plan related to  self management and patient's adherence to plan as established  by provider   Hypertension: (Status: Goal on Track (progressing): YES.) Long Term Goal Ms. Palazzola is checking her BP, taking her medication and exercising. She has a PCP visit on 05/16/22. Last practice recorded BP readings:  BP Readings from  Last 3 Encounters:  05/01/21 (!) 142/99  12/10/20 (!) 170/119  11/27/20 (!) 157/120  Most recent eGFR/CrCl:  Lab Results  Component Value Date   EGFR 87 05/01/2021    No components found for: "CRCL"  Provided education to patient re: stroke prevention, s/s of heart attack and stroke; Reviewed medications with patient and discussed importance of compliance;  Counseled on the importance of exercise goals with target of 150 minutes per week Discussed plans with patient for ongoing care management follow up and provided patient with direct contact information for care management team; Advised patient, providing education and rationale, to monitor blood pressure daily and record, calling PCP for findings outside established parameters;  Provided education on prescribed diet heart healthy;  Advised patient to check BP at home and record at least 3 times a week, take record to next PCP visit Provided education on managing HTN  Patient Goals/Self-Care Activities: Take medications as prescribed   Attend all scheduled provider appointments Call provider office for new concerns or questions  Check BP at home and record

## 2022-05-14 NOTE — Patient Outreach (Signed)
Medicaid Managed Care   Nurse Care Manager Note  05/14/2022 Name:  Tonya Wilkerson MRN:  [id number] DOB:  04-Jun-1970  Tonya Wilkerson is an 52 y.o. year old female who is Wilkerson primary patient of Tonya Pounds, NP.  The [facility] Managed Care Coordination team was consulted for assistance with:    HTN  Ms. Tonya Wilkerson was given information about Medicaid Managed Care Coordination team services today. Tonya Wilkerson Patient agreed to services and verbal consent obtained.  Engaged with patient by telephone for follow up visit in response to provider referral for case management and/or care coordination services.   Assessments/Interventions:  Review of past medical history, allergies, medications, health status, including review of consultants reports, laboratory and other test data, was performed as part of comprehensive evaluation and provision of chronic care management services.  SDOH (Social Determinants of Health) assessments and interventions performed: SDOH Interventions    Flowsheet Row Patient Outreach Telephone from 03/11/2022 in [place] Telephone from 02/25/2022 in [place] Interventions Intervention Not Indicated --  Housing Interventions Intervention Not Indicated --  Transportation Interventions Intervention Not Indicated Intervention Not Indicated       Care Plan  No Known Allergies  Medications Reviewed Today     Reviewed by Tonya Montane, RN (Registered Nurse) on 05/14/22 at 81  Med List Status: <None>   Medication Order Taking? Sig Documenting Provider Last Dose Status Informant  albuterol (VENTOLIN HFA) 108 (90 Base) MCG/ACT inhaler [id number] Yes Inhale 1-2 puffs into the lungs every 4 (four) hours as needed for wheezing or shortness of breath. Tonya Pounds, NP Taking Active   amLODipine (NORVASC) 10 MG tablet [id number] Yes Take 1 tablet (10 mg total) by mouth daily.  Tonya Pounds, NP Taking Active   Blood Glucose Monitoring Suppl (ACCU-CHEK GUIDE) w/Device [id number] [id number] No 1 each by Does not apply route 2 (two) times daily.  Patient not taking: Reported on 05/14/2022   Tonya Pounds, NP Not Taking Active   busPIRone (BUSPAR) 15 MG tablet [id number] Yes TAKE 1 TABLET(15 MG) BY MOUTH THREE TIMES DAILY Tonya Rakes, MD Taking Active   glucose blood (ACCU-CHEK GUIDE) test strip [id number] No Use as instructed. Inject into the skin twice daily E11.65  Patient not taking: Reported on 05/14/2022   Tonya Pounds, NP Not Taking Active   ibuprofen (ADVIL) 600 MG tablet [id number] Yes Take 1 tablet (600 mg total) by mouth every 6 (six) hours. Tonya Drafts, MD Taking Active            Med Note (Tonya Wilkerson, Tonya Wilkerson   Tue Mar 11, 2022  1:11 PM) As needed  ipratropium-albuterol (DUONEB) 0.5-2.5 (3) MG/3ML SOLN [id number] Yes Take 3 mLs by nebulization every 4 (four) hours as needed. Tonya Pounds, NP Taking Active   losartan (COZAAR) 50 MG tablet [id number] Yes Take 1 tablet (50 mg total) by mouth daily. Tonya Pounds, NP Taking Active            Med Note Tonya Wilkerson, Tonya Wilkerson   Fri Sep 06, 2021 10:01 AM) Twice daily  metFORMIN (GLUCOPHAGE) 500 MG tablet [id number] No Take 1 tablet (500 mg total) by mouth daily with breakfast.  Patient not taking: Reported on 02/25/2022   Tonya Pounds, NP Not Taking Active Self  mometasone-formoterol [facility]) 100-5 MCG/ACT Tonya Wilkerson [id number] Yes Inhale 2 puffs into the lungs 2 (two) times daily. Tonya Pounds, NP  Taking Active   oxymetazoline (NASAL SPRAY 12 HOUR) 0.05 % nasal spray [id number] Yes Place 1 spray into both nostrils 2 (two) times daily. Tonya Pounds, NP Taking Active   pravastatin (PRAVACHOL) 20 MG tablet [id number] No TAKE 1 TABLET(20 MG) BY MOUTH DAILY  Patient not taking: Reported on 05/14/2022   Tonya Pounds, NP Not Taking Active   promethazine-dextromethorphan (PROMETHAZINE-DM) 6.25-15 MG/5ML  syrup [id number] No Take 5 mLs by mouth 4 (four) times daily as needed for cough.  Patient not taking: Reported on 05/14/2022   Tonya Pounds, NP Not Taking Active   pseudoephedrine (SUDAFED) 30 MG tablet [id number] Yes Take 30 mg by mouth every 4 (four) hours as needed for congestion. 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   Asthma 10/21/2020   Type 2 diabetes mellitus without complication, without long-term current use of insulin ([place]) 05/25/2018   Adjustment disorder with mixed anxiety and depressed mood 02/02/2018   Essential hypertension 04/21/2017   Other insomnia 04/21/2017   Hematuria 10/13/2016   Hyperbilirubinemia 10/13/2016   Morbid obesity with BMI of 40.0-44.9, adult ([place]) 10/13/2016   Iron deficiency anemia due to chronic blood loss 04/22/2016    Conditions to be addressed/monitored per PCP order:  HTN  Care Plan : RN Care Manager Plan of Care  Updates made by Tonya Montane, RN since 05/14/2022 12:00 AM     Problem: Health Management needs related to HTN      Long-Range Goal: Development of Plan of Care to address Health Management needs related to HTN   Start Date: 03/11/2022  Expected End Date: 07/15/2022  Priority: High  Note:   Current Barriers:  Chronic Disease Management support and education needs related to HTN-Tonya Wilkerson has not been feeling well due to sinus issues and has not been checking her BP at home. She is working today and will call to schedule with PCP later this week.  RNCM Clinical Goal(s):  Patient will verbalize understanding of plan for management of HTN as evidenced by patient reports take all  medications exactly as prescribed and will call provider for medication related questions as evidenced by patient reports and EMR documentation    attend all scheduled medical appointments: PCP on 05/16/22 as evidenced by provider documentation          Interventions: Inter-disciplinary care team collaboration (see longitudinal plan of care) Evaluation of current treatment plan related to  self management and patient's adherence to plan as established by provider   Hypertension: (Status: Goal on Track (progressing): YES.) Long Term Goal Ms. Chopra is checking her BP, taking her medication and exercising. She has Wilkerson PCP visit on 05/16/22. Last practice recorded BP readings:  BP Readings from Last 3 Encounters:  05/01/21 (!) 142/99  12/10/20 (!) 170/119  11/27/20 (!) 157/120  Most recent eGFR/CrCl:  Lab Results  Component Value Date   EGFR 87 05/01/2021    No components found for: "CRCL"  Provided education to patient re: stroke prevention, s/s of heart attack  and stroke; Reviewed medications with patient and discussed importance of compliance;  Counseled on the importance of exercise goals with target of 150 minutes per week Discussed plans with patient for ongoing care management follow up and provided patient with direct contact information for care management team; Advised patient, providing education and rationale, to monitor blood pressure daily and record, calling PCP for findings outside established parameters;  Provided education on prescribed diet heart healthy;  Advised patient to check BP at home and record at least 3 times Wilkerson week, take record to next PCP visit Provided education on managing HTN  Patient Goals/Self-Care Activities: Take medications as prescribed   Attend all scheduled provider appointments Call provider office for new concerns or questions  Check BP at home and record       Follow Up:  Patient agrees to Care Plan and Follow-up.  Plan: The Managed Medicaid  care management team will reach out to the patient again over the next 30 days.  Date/time of next scheduled RN care management/care coordination outreach:  06/13/22 @ 11:15am  Lurena Joiner RN, BSN   [employer] manager

## 2022-05-16 ENCOUNTER — Encounter: Payer: Self-pay | Admitting: Nurse Practitioner

## 2022-05-16 ENCOUNTER — Ambulatory Visit: Payer: Medicaid Other | Attending: Nurse Practitioner | Admitting: Nurse Practitioner

## 2022-05-16 VITALS — BP 180/137 | HR 90 | Ht 72.0 in | Wt 341.8 lb

## 2022-05-16 DIAGNOSIS — Z0000 Encounter for general adult medical examination without abnormal findings: Secondary | ICD-10-CM

## 2022-05-16 DIAGNOSIS — Z1211 Encounter for screening for malignant neoplasm of colon: Secondary | ICD-10-CM | POA: Diagnosis not present

## 2022-05-16 DIAGNOSIS — E119 Type 2 diabetes mellitus without complications: Secondary | ICD-10-CM | POA: Diagnosis not present

## 2022-05-16 DIAGNOSIS — E785 Hyperlipidemia, unspecified: Secondary | ICD-10-CM

## 2022-05-16 DIAGNOSIS — J3089 Other allergic rhinitis: Secondary | ICD-10-CM

## 2022-05-16 DIAGNOSIS — I10 Essential (primary) hypertension: Secondary | ICD-10-CM | POA: Diagnosis not present

## 2022-05-16 MED ORDER — FLUTICASONE PROPIONATE 50 MCG/ACT NA SUSP: 2.0000 | Freq: Every day | NASAL | 6 refills | Status: AC

## 2022-05-16 MED ORDER — AMLODIPINE BESYLATE-VALSARTAN 10-320 MG PO TABS: 1.0000 | ORAL_TABLET | Freq: Every day | ORAL | 0 refills | Status: DC

## 2022-05-16 MED ORDER — AMLODIPINE-VALSARTAN-HCTZ 10-160-12.5 MG PO TABS: 1.0000 | ORAL_TABLET | Freq: Every day | ORAL | 0 refills | Status: DC

## 2022-05-16 MED ORDER — BLOOD PRESSURE MONITOR DEVI: 0 refills | Status: AC

## 2022-05-16 MED ORDER — CETIRIZINE HCL 10 MG PO TABS: 10.0000 mg | ORAL_TABLET | Freq: Every day | ORAL | 3 refills | Status: AC

## 2022-05-16 NOTE — Patient Instructions (Addendum)
I would prefer you to take the Exforge with amlodipine-valsartan-HCT  However you can also take Amlodipine and Valsartan if above too expensive

## 2022-05-16 NOTE — Progress Notes (Unsigned)
Assessment & Plan:  Tonya Wilkerson was seen today for annual exam.  Diagnoses and all orders for this visit:  Type 2 diabetes mellitus without complication, without long-term current use of insulin (HCC) -     Hemoglobin A1c -     Microalbumin / creatinine urine ratio  Colon cancer screening -     Cancel: Ambulatory referral to Gastroenterology -     Ambulatory referral to Gastroenterology -     Ambulatory referral to Gastroenterology  Dyslipidemia, goal LDL below 70 -     Lipid panel  Primary hypertension -     amLODIPine-Valsartan-HCTZ 10-160-12.5 MG TABS; Take 1 tablet by mouth daily. -     amLODipine-valsartan (EXFORGE) 10-320 MG tablet; Take 1 tablet by mouth daily.  Essential hypertension  Other orders -     cetirizine (ZYRTEC) 10 MG tablet; Take 1 tablet (10 mg total) by mouth daily. -     fluticasone (FLONASE) 50 MCG/ACT nasal spray; Place 2 sprays into both nostrils daily.    Patient has been counseled on age-appropriate routine health concerns for screening and prevention. These are reviewed and up-to-date. Referrals have been placed accordingly. Immunizations are up-to-date or declined.    Subjective:   Chief Complaint  Patient presents with   Annual Exam   [name] 52 y.o. female presents to office today   BP Readings from Last 3 Encounters:  05/16/22 (!) 187/140  05/01/21 (!) 142/99  12/10/20 (!) 170/119     ROS  Past Medical History:  Diagnosis Date   Abnormal uterine bleeding (AUB)    Anemia    Anxiety    Asthma    COVID 03/2020   mild cold symptoms x 3 days all symptoms resolved   Dyspnea    with exertion   History of blood transfusion yrs ago   after childbirth   History of pre-eclampsia 17 yrs ago   Hypertension    Morbid obesity with BMI of 40.0-44.9, adult ([place])    Pneumonia due to infectious organism 2020   Sleep apnea dx 2021   did not tolerate cpap mild osa    Wears glasses     Past Surgical History:  Procedure  Laterality Date   CYSTOSCOPY N/A 07/17/2020   Procedure: CYSTOSCOPY;  Surgeon: Lavonia Drafts, MD;  Location: [place];  Service: Gynecology;  Laterality: N/A;   ROBOTIC ASSISTED LAPAROSCOPIC HYSTERECTOMY AND SALPINGECTOMY Bilateral 07/17/2020   Procedure: XI ROBOTIC ASSISTED TOTAL HYSTERECTOMY WITH BILATERAL SALPINGECTOMY;  Surgeon: Lavonia Drafts, MD;  Location: [place];  Service: Gynecology;  Laterality: Bilateral;   TUBAL LIGATION  17 yrs ago    Family History  Problem Relation Age of Onset   Hypertension Mother    Stroke Mother    Diabetes Maternal Aunt    Cancer Maternal Grandfather     Social History Reviewed with no changes to be made today.   Outpatient Medications Prior to Visit  Medication Sig Dispense Refill   albuterol (VENTOLIN HFA) 108 (90 Base) MCG/ACT inhaler Inhale 1-2 puffs into the lungs every 4 (four) hours as needed for wheezing or shortness of breath. 18 g 2   Blood Glucose Monitoring Suppl (ACCU-CHEK GUIDE) w/Device KIT 1 each by Does not apply route 2 (two) times daily. 1 kit 0   busPIRone (BUSPAR) 15 MG tablet TAKE 1 TABLET(15 MG) BY MOUTH THREE TIMES DAILY 90 tablet 2   glucose blood (ACCU-CHEK GUIDE) test strip Use as instructed. Inject into the skin twice  daily E11.65 200 each 12   ibuprofen (ADVIL) 600 MG tablet Take 1 tablet (600 mg total) by mouth every 6 (six) hours. 40 tablet 1   ipratropium-albuterol (DUONEB) 0.5-2.5 (3) MG/3ML SOLN Take 3 mLs by nebulization every 4 (four) hours as needed. 360 mL 3   metFORMIN (GLUCOPHAGE) 500 MG tablet Take 1 tablet (500 mg total) by mouth daily with breakfast. 90 tablet 1   mometasone-formoterol (DULERA) 100-5 MCG/ACT AERO Inhale 2 puffs into the lungs 2 (two) times daily. 13 g 6   oxymetazoline (NASAL SPRAY 12 HOUR) 0.05 % nasal spray Place 1 spray into both nostrils 2 (two) times daily. 30 mL 0   pravastatin (PRAVACHOL) 20 MG tablet TAKE 1 TABLET(20 MG) BY MOUTH  DAILY 90 tablet 0   traZODone (DESYREL) 150 MG tablet TAKE 1 TABLET(150 MG) BY MOUTH AT BEDTIME 90 tablet 1   amLODipine (NORVASC) 10 MG tablet Take 1 tablet (10 mg total) by mouth daily. 90 tablet 1   losartan (COZAAR) 50 MG tablet Take 1 tablet (50 mg total) by mouth daily. 90 tablet 1   promethazine-dextromethorphan (PROMETHAZINE-DM) 6.25-15 MG/5ML syrup Take 5 mLs by mouth 4 (four) times daily as needed for cough. 240 mL 0   pseudoephedrine (SUDAFED) 30 MG tablet Take 30 mg by mouth every 4 (four) hours as needed for congestion.     No facility-administered medications prior to visit.    No Known Allergies     Objective:    BP (!) 187/140   Pulse 90   Ht 6' (1.829 m)   Wt (!) 341 lb 12.8 oz (155 kg)   LMP 06/15/2020   SpO2 98%   BMI 46.36 kg/m  Wt Readings from Last 3 Encounters:  05/16/22 (!) 341 lb 12.8 oz (155 kg)  06/19/21 (!) 328 lb 12.8 oz (149.1 kg)  05/01/21 (!) 319 lb (144.7 kg)    Physical Exam       Patient has been counseled extensively about nutrition and exercise as well as the importance of adherence with medications and regular follow-up. The patient was given clear instructions to go to ER or return to medical center if symptoms don't improve, worsen or new problems develop. The patient verbalized understanding.   Follow-up: No follow-ups on file.   Gildardo Pounds, FNP-BC [facility] [place], [place]   05/16/2022, 3:29 PM

## 2022-05-18 LAB — MICROALBUMIN / CREATININE URINE RATIO
Creatinine, Urine: 131.3 mg/dL
Microalb/Creat Ratio: 21 mg/g creat (ref 0–29)
Microalbumin, Urine: 28 ug/mL

## 2022-05-18 LAB — LIPID PANEL
Chol/HDL Ratio: 1.9 ratio (ref 0.0–4.4)
Cholesterol, Total: 237 mg/dL — ABNORMAL HIGH (ref 100–199)
HDL: 125 mg/dL (ref >39)
LDL Chol Calc (NIH): 97 mg/dL (ref 0–99)
Triglycerides: 88 mg/dL (ref 0–149)
VLDL Cholesterol Cal: 15 mg/dL (ref 5–40)

## 2022-05-18 LAB — HEMOGLOBIN A1C
Est. average glucose Bld gHb Est-mCnc: 140 mg/dL
Hgb A1c MFr Bld: 6.5 % — ABNORMAL HIGH (ref 4.8–5.6)

## 2022-05-19 ENCOUNTER — Other Ambulatory Visit: Payer: Self-pay | Admitting: Nurse Practitioner

## 2022-05-19 DIAGNOSIS — E119 Type 2 diabetes mellitus without complications: Secondary | ICD-10-CM

## 2022-05-19 MED ORDER — METFORMIN HCL 500 MG PO TABS: 500.0000 mg | ORAL_TABLET | Freq: Every day | ORAL | 1 refills | Status: DC

## 2022-05-21 ENCOUNTER — Ambulatory Visit: Payer: Medicaid Other

## 2022-05-22 ENCOUNTER — Encounter: Payer: Self-pay | Admitting: Nurse Practitioner

## 2022-06-03 ENCOUNTER — Ambulatory Visit (INDEPENDENT_AMBULATORY_CARE_PROVIDER_SITE_OTHER): Payer: Medicaid Other

## 2022-06-03 ENCOUNTER — Other Ambulatory Visit: Payer: Self-pay | Admitting: Physician Assistant

## 2022-06-03 ENCOUNTER — Ambulatory Visit: Payer: Medicaid Other | Admitting: Physician Assistant

## 2022-06-03 ENCOUNTER — Encounter: Payer: Self-pay | Admitting: Physician Assistant

## 2022-06-03 VITALS — BP 135/94 | HR 102 | Ht 73.0 in | Wt 337.0 lb

## 2022-06-03 DIAGNOSIS — Z6841 Body Mass Index (BMI) 40.0 and over, adult: Secondary | ICD-10-CM | POA: Diagnosis not present

## 2022-06-03 DIAGNOSIS — G4709 Other insomnia: Secondary | ICD-10-CM | POA: Diagnosis not present

## 2022-06-03 DIAGNOSIS — Z1231 Encounter for screening mammogram for malignant neoplasm of breast: Secondary | ICD-10-CM | POA: Diagnosis not present

## 2022-06-03 DIAGNOSIS — I10 Essential (primary) hypertension: Secondary | ICD-10-CM

## 2022-06-03 MED ORDER — HYDROCHLOROTHIAZIDE 12.5 MG PO TABS: 12.5000 mg | ORAL_TABLET | Freq: Every day | ORAL | 1 refills | Status: DC

## 2022-06-03 MED ORDER — TRAZODONE HCL 50 MG PO TABS: ORAL_TABLET | ORAL | 1 refills | Status: DC

## 2022-06-03 NOTE — Patient Instructions (Signed)
You are going to add hydrochlorothiazide 12.5 mg to your combination blood pressure medication making this 25 mg of hydrochlorothiazide that you are taking on a daily basis.  I encourage you to continue checking your blood pressure at home, keeping a written log and having available for all office visits.  I strongly encourage you to work on improving your use with your CPAP, I encourage you to do a trial of the trazodone to see if this offers relief for you.  Please let us know if there is anything else we can do for you.  Kennieth Rad, PA-C Physician Assistant [place] [email or web address]   CPAP and BIPAP Information CPAP and BIPAP are methods that use air pressure to keep your airways open and to help you breathe well. CPAP and BIPAP use different amounts of pressure. Your health care provider will tell you whether CPAP or BIPAP would be more helpful for you. CPAP stands for "continuous positive airway pressure." With CPAP, the amount of pressure stays the same while you breathe in (inhale) and out (exhale). BIPAP stands for "bi-level positive airway pressure." With BIPAP, the amount of pressure will be higher when you inhale and lower when you exhale. This allows you to take larger breaths. CPAP or BIPAP may be used in the hospital, or your health care provider may want you to use it at home. You may need to have a sleep study before your health care provider can order a machine for you to use at home. What are the advantages? CPAP or BIPAP can be helpful if you have: Sleep apnea. Chronic obstructive pulmonary disease (COPD). Heart failure. Medical conditions that cause muscle weakness, including muscular dystrophy or amyotrophic lateral sclerosis (ALS). Other problems that cause breathing to be shallow, weak, abnormal, or difficult. CPAP and BIPAP are most commonly used for obstructive sleep apnea (OSA) to keep the airways from  collapsing when the muscles relax during sleep. What are the risks? Generally, this is a safe treatment. However, problems may occur, including: Irritated skin or skin sores if the mask does not fit properly. Dry or stuffy nose or nosebleeds. Dry mouth. Feeling gassy or bloated. Sinus or lung infection if the equipment is not cleaned properly. When should CPAP or BIPAP be used? In most cases, the mask only needs to be worn during sleep. Generally, the mask needs to be worn throughout the night and during any daytime naps. People with certain medical conditions may also need to wear the mask at other times, such as when they are awake. Follow instructions from your health care provider about when to use the machine. What happens during CPAP or BIPAP?  Both CPAP and BIPAP are provided by a small machine with a flexible plastic tube that attaches to a plastic mask that you wear. Air is blown through the mask into your nose or mouth. The amount of pressure that is used to blow the air can be adjusted on the machine. Your health care provider will set the pressure setting and help you find the best mask for you. Tips for using the mask Because the mask needs to be snug, some people feel trapped or closed-in (claustrophobic) when first using the mask. If you feel this way, you may need to get used to the mask. One way to do this is to hold the mask loosely over your nose or mouth and then gradually apply the mask more snugly. You can also gradually increase the amount of time  that you use the mask. Masks are available in various types and sizes. If your mask does not fit well, talk with your health care provider about getting a different one. Some common types of masks include: Full face masks, which fit over the mouth and nose. Nasal masks, which fit over the nose. Nasal pillow or prong masks, which fit into the nostrils. If you are using a mask that fits over your nose and you tend to breathe through  your mouth, a chin strap may be applied to help keep your mouth closed. Use a skin barrier to protect your skin as told by your health care provider. Some CPAP and BIPAP machines have alarms that may sound if the mask comes off or develops a leak. If you have trouble with the mask, it is very important that you talk with your health care provider about finding a way to make the mask easier to tolerate. Do not stop using the mask. There could be a negative impact on your health if you stop using the mask. Tips for using the machine Place your CPAP or BIPAP machine on a secure table or stand near an electrical outlet. Know where the on/off switch is on the machine. Follow instructions from your health care provider about how to set the pressure on your machine and when you should use it. Do not eat or drink while the CPAP or BIPAP machine is on. Food or fluids could get pushed into your lungs by the pressure of the CPAP or BIPAP. For home use, CPAP and BIPAP machines can be rented or purchased through home health care companies. Many different brands of machines are available. Renting a machine before purchasing may help you find out which particular machine works well for you. Your health insurance company may also decide which machine you may get. Keep the CPAP or BIPAP machine and attachments clean. Ask your health care provider for specific instructions. Check the humidifier if you have a dry stuffy nose or nosebleeds. Make sure it is working correctly. Follow these instructions at home: Take over-the-counter and prescription medicines only as told by your health care provider. Ask if you can take sinus medicine if your sinuses are blocked. Do not use any products that contain nicotine or tobacco. These products include cigarettes, chewing tobacco, and vaping devices, such as e-cigarettes. If you need help quitting, ask your health care provider. Keep all follow-up visits. This is  important. Contact a health care provider if: You have redness or pressure sores on your head, face, mouth, or nose from the mask or head gear. You have trouble using the CPAP or BIPAP machine. You cannot tolerate wearing the CPAP or BIPAP mask. Someone tells you that you snore even when wearing your CPAP or BIPAP. Get help right away if: You have trouble breathing. You feel confused. Summary CPAP and BIPAP are methods that use air pressure to keep your airways open and to help you breathe well. If you have trouble with the mask, it is very important that you talk with your health care provider about finding a way to make the mask easier to tolerate. Do not stop using the mask. There could be a negative impact to your health if you stop using the mask. Follow instructions from your health care provider about when to use the machine. This information is not intended to replace advice given to you by your health care provider. Make sure you discuss any questions you have with your  health care provider. Document Revised: 10/10/2020 Document Reviewed: 02/10/2020 Elsevier Patient Education  [place].

## 2022-06-03 NOTE — Progress Notes (Signed)
Established Patient Office Visit  Subjective   Patient ID: Tonya Wilkerson, female    DOB: September 04, 1970  Age: 52 y.o. MRN: [id number]  Chief Complaint  Patient presents with   Hypertension    PCP Zelda, Has appointment on Friday, but she has to work, she will reschedule.     States that she has been checking her blood pressure at home several times a day, states readings have been elevated, similar to today's are slightly above.  Denies any low blood pressure readings.  States that she has been watching her sodium intake, eats very little processed foods, states that she is drinking lots of water on a daily basis.  States that she is sleeping approximately 7 hours a night, but states that she still feels very fatigued for the first 4 hours after waking up.  States that she has not been using the trazodone.  States that she has a difficult time using her CPAP.      Past Medical History:  Diagnosis Date   Abnormal uterine bleeding (AUB)    Anemia    Anxiety    Asthma    COVID 03/2020   mild cold symptoms x 3 days all symptoms resolved   Dyspnea    with exertion   History of blood transfusion yrs ago   after childbirth   History of pre-eclampsia 17 yrs ago   Hypertension    Morbid obesity with BMI of 40.0-44.9, adult (HCC)    Pneumonia due to infectious organism 2020   Sleep apnea dx 2021   did not tolerate cpap mild osa    Wears glasses    Social History   Socioeconomic History   Marital status: Divorced    Spouse name: Not on file   Number of children: Not on file   Years of education: Not on file   Highest education level: Not on file  Occupational History   Not on file  Tobacco Use   Smoking status: Former    Years: 2    Types: Cigarettes   Smokeless tobacco: Never   Tobacco comments:    social smoker   Vaping Use   Vaping Use: Never used  Substance and Sexual Activity   Alcohol use: Yes    Comment: daily 1-2 drinks per day   Drug use: No   Sexual activity:  Not Currently    Birth control/protection: Surgical  Other Topics Concern   Not on file  Social History Narrative   Not on file   Social Determinants of Health   Financial Resource Strain: Not on file  Food Insecurity: No Food Insecurity (03/11/2022)   Hunger Vital Sign    Worried About Running Out of Food in the Last Year: Never true    Ran Out of Food in the Last Year: Never true  Transportation Needs: No Transportation Needs (03/11/2022)   PRAPARE - Hydrologist (Medical): No    Lack of Transportation (Non-Medical): No  Physical Activity: Not on file  Stress: Not on file  Social Connections: Not on file  Intimate Partner Violence: Not on file   Family History  Problem Relation Age of Onset   Hypertension Mother    Stroke Mother    Diabetes Maternal Aunt    Cancer Maternal Grandfather    No Known Allergies  Review of Systems  Constitutional: Negative.   HENT: Negative.    Eyes: Negative.   Respiratory:  Negative for shortness of breath.  Cardiovascular:  Negative for chest pain.  Gastrointestinal: Negative.   Genitourinary: Negative.   Musculoskeletal: Negative.   Skin: Negative.   Neurological: Negative.   Endo/Heme/Allergies: Negative.   Psychiatric/Behavioral: Negative.        Objective:     BP (!) 135/94 (BP Location: Left Arm, Patient Position: Sitting, Cuff Size: Large)   Pulse (!) 102   Ht 6\' 1"  (1.854 m)   Wt (!) 337 lb (152.9 kg)   LMP 06/15/2020   SpO2 98%   BMI 44.46 kg/m  BP Readings from Last 3 Encounters:  06/03/22 (!) 135/94  05/16/22 (!) 180/137  05/01/21 (!) 142/99   Wt Readings from Last 3 Encounters:  06/03/22 (!) 337 lb (152.9 kg)  05/16/22 (!) 341 lb 12.8 oz (155 kg)  06/19/21 (!) 328 lb 12.8 oz (149.1 kg)      Physical Exam Vitals and nursing note reviewed.  Constitutional:      Appearance: Normal appearance. She is obese.  HENT:     Head: Normocephalic and atraumatic.     Right Ear:  External ear normal.     Left Ear: External ear normal.     Nose: Nose normal.     Mouth/Throat:     Mouth: Mucous membranes are moist.     Pharynx: Oropharynx is clear.  Eyes:     Extraocular Movements: Extraocular movements intact.     Conjunctiva/sclera: Conjunctivae normal.     Pupils: Pupils are equal, round, and reactive to light.  Cardiovascular:     Rate and Rhythm: Normal rate and regular rhythm.     Pulses: Normal pulses.     Heart sounds: Normal heart sounds.  Pulmonary:     Effort: Pulmonary effort is normal.     Breath sounds: Normal breath sounds.  Musculoskeletal:        General: Normal range of motion.     Cervical back: Normal range of motion and neck supple.  Skin:    General: Skin is warm and dry.  Neurological:     General: No focal deficit present.     Mental Status: She is alert and oriented to person, place, and time.  Psychiatric:        Mood and Affect: Mood normal.        Behavior: Behavior normal.        Thought Content: Thought content normal.        Judgment: Judgment normal.       Assessment & Plan:   Problem List Items Addressed This Visit       Cardiovascular and Mediastinum   Essential hypertension - Primary   Relevant Medications   hydrochlorothiazide (HYDRODIURIL) 12.5 MG tablet     Other   Other insomnia   Relevant Medications   traZODone (DESYREL) 50 MG tablet   Other Visit Diagnoses     Class 3 severe obesity due to excess calories with serious comorbidity and body mass index (BMI) of 40.0 to 44.9 in adult ([place])          1. Essential hypertension Add an additional 12.5 mg hydrochlorothiazide, continue checking blood pressure on a daily basis, keeping a written log and having available for all office visits.  Patient given appointment to follow-up with primary care provider June 25, 2022.  Red flags given for prompt reevaluation - hydrochlorothiazide (HYDRODIURIL) 12.5 MG tablet; Take 1 tablet (12.5 mg total) by mouth  daily.  Dispense: 30 tablet; Refill: 1  2. Other insomnia Trial trazodone 50 mg,  strongly encouraged to continue working on using CPAP.  Patient education given on good sleep hygiene - traZODone (DESYREL) 50 MG tablet; Take 1/2 - 1 full tab PO QHS PRN  Dispense: 30 tablet; Refill: 1  3. Class 3 severe obesity due to excess calories with serious comorbidity and body mass index (BMI) of 40.0 to 44.9 in adult [facility]) Patient education given on mindfulness, decreasing portion sizes   I have reviewed the patient's medical history (PMH, PSH, Social History, Family History, Medications, and allergies) , and have been updated if relevant. I spent 30 minutes reviewing chart and  face to face time with patient.    Return if symptoms worsen or fail to improve.    Loraine Grip Mayers, PA-C

## 2022-06-05 ENCOUNTER — Other Ambulatory Visit: Payer: Self-pay | Admitting: Nurse Practitioner

## 2022-06-05 DIAGNOSIS — R928 Other abnormal and inconclusive findings on diagnostic imaging of breast: Secondary | ICD-10-CM

## 2022-06-06 ENCOUNTER — Ambulatory Visit: Payer: Medicaid Other | Admitting: Nurse Practitioner

## 2022-06-06 ENCOUNTER — Other Ambulatory Visit: Payer: Self-pay | Admitting: Nurse Practitioner

## 2022-06-06 ENCOUNTER — Telehealth: Payer: Self-pay | Admitting: *Deleted

## 2022-06-06 DIAGNOSIS — R928 Other abnormal and inconclusive findings on diagnostic imaging of breast: Secondary | ICD-10-CM

## 2022-06-06 NOTE — Telephone Encounter (Signed)
Pt given mammogram results per notes of Z. Raul Del, NP from 06/05/22 on 06/06/22. Pt verbalized understanding.  And reports she already has appt set up for another mammogram.

## 2022-06-13 ENCOUNTER — Other Ambulatory Visit: Payer: Medicaid Other | Admitting: *Deleted

## 2022-06-13 ENCOUNTER — Encounter: Payer: Self-pay | Admitting: *Deleted

## 2022-06-13 NOTE — Patient Outreach (Signed)
Medicaid Managed Care   Nurse Care Manager Note  06/13/2022 Name:  Tonya Wilkerson MRN:  [id number] DOB:  Aug 19, 1970  Tonya Wilkerson is an 52 y.o. year old female who is Wilkerson primary patient of Tonya Pounds, NP.  The [facility] Managed Care Coordination team was consulted for assistance with:    HTN  Tonya Wilkerson was given information about Medicaid Managed Care Coordination team services today. Tonya Wilkerson Patient agreed to services and verbal consent obtained.  Engaged with patient by telephone for follow up visit in response to provider referral for case management and/or care coordination services.   Assessments/Interventions:  Review of past medical history, allergies, medications, health status, including review of consultants reports, laboratory and other test data, was performed as part of comprehensive evaluation and provision of chronic care management services.  SDOH (Social Determinants of Health) assessments and interventions performed: SDOH Interventions    Flowsheet Row Patient Outreach Telephone from 06/13/2022 in [place] Patient Outreach Telephone from 03/11/2022 in [place] Telephone from 02/25/2022 in [place] Interventions -- Intervention Not Indicated --  Housing Interventions -- Intervention Not Indicated --  Transportation Interventions Intervention Not Indicated Intervention Not Indicated Intervention Not Indicated  Utilities Interventions Intervention Not Indicated -- --       Care Plan  No Known Allergies  Medications Reviewed Today     Reviewed by Tonya Montane, RN (Registered Nurse) on 06/13/22 at 1144  Med List Status: <None>   Medication Order Taking? Sig Documenting Provider Last Dose Status Informant  albuterol (VENTOLIN HFA) 108 (90 Base) MCG/ACT inhaler [id number] Yes Inhale 1-2 puffs into the lungs every 4 (four) hours  as needed for wheezing or shortness of breath. Tonya Pounds, NP Taking Active   amLODipine-valsartan (EXFORGE) 10-320 MG tablet [id number] No Take 1 tablet by mouth daily.  Patient not taking: Reported on 06/13/2022   Tonya Pounds, NP Not Taking Active   amLODIPine-Valsartan-HCTZ 10-160-12.5 MG TABS [id number] Yes Take 1 tablet by mouth daily. Tonya Pounds, NP Taking Active   Blood Glucose Monitoring Suppl (ACCU-CHEK GUIDE) w/Device KIT [id number] Yes 1 each by Does not apply route 2 (two) times daily. Tonya Pounds, NP Taking Active   Blood Pressure Monitor DEVI [id number] Yes Please provide patient with insurance approved blood pressure monitor Tonya Pounds, NP Taking Active   busPIRone (BUSPAR) 15 MG tablet [id number] Yes TAKE 1 TABLET(15 MG) BY MOUTH THREE TIMES DAILY Tonya Rakes, MD Taking Active            Med Note (Tonya Wilkerson   Fri Jun 13, 2022 11:41 AM) Taking as needed  cetirizine (ZYRTEC) 10 MG tablet [id number] Yes Take 1 tablet (10 mg total) by mouth daily. Tonya Pounds, NP Taking Active   fluticasone [facility]) 50 MCG/ACT nasal spray [id number] Yes Place 2 sprays into both nostrils daily. Tonya Pounds, NP Taking Active   glucose blood (ACCU-CHEK GUIDE) test strip [id number] Yes Use as instructed. Inject into the skin twice daily E11.65 Tonya Pounds, NP Taking Active   hydrochlorothiazide (HYDRODIURIL) 12.5 MG tablet [id number] Yes Take 1 tablet (12.5 mg total) by mouth daily. Mayers, Cari S, PA-C Taking Active   ibuprofen (ADVIL) 600 MG tablet [id number] Yes Take 1 tablet (600 mg total) by mouth every 6 (six) hours. Tonya Drafts, MD Taking Active  Med Note (Tonya Wilkerson   Tue Mar 11, 2022  1:11 PM) As needed  ipratropium-albuterol (DUONEB) 0.5-2.5 (3) MG/3ML SOLN [id number] Yes Take 3 mLs by nebulization every 4 (four) hours as needed. Tonya Pounds, NP Taking Active   metFORMIN (GLUCOPHAGE) 500 MG tablet [id number] Yes Take 1 tablet  (500 mg total) by mouth daily with breakfast. Tonya Pounds, NP Taking Active   mometasone-formoterol [facility]) 100-5 MCG/ACT Hollie Salk [id number] Yes Inhale 2 puffs into the lungs 2 (two) times daily. Tonya Pounds, NP Taking Active   oxymetazoline (NASAL SPRAY 12 HOUR) 0.05 % nasal spray [id number] No Place 1 spray into both nostrils 2 (two) times daily.  Patient not taking: Reported on 06/13/2022   Tonya Pounds, NP Not Taking Active   pravastatin (PRAVACHOL) 20 MG tablet [id number] Yes TAKE 1 TABLET(20 MG) BY MOUTH DAILY Tonya Pounds, NP Taking Active   traZODone (DESYREL) 50 MG tablet [id number] Yes Take 1/2 - 1 full tab PO QHS PRN Mayers, Cari S, PA-C Taking Active             Patient Active Problem List   Diagnosis Date Noted   Keloid scar 12/10/2020   Asthma 10/21/2020   Type 2 diabetes mellitus without complication, without long-term current use of insulin ([place]) 05/25/2018   Adjustment disorder with mixed anxiety and depressed mood 02/02/2018   Essential hypertension 04/21/2017   Other insomnia 04/21/2017   Hematuria 10/13/2016   Hyperbilirubinemia 10/13/2016   Class 3 severe obesity due to excess calories with serious comorbidity and body mass index (BMI) of 40.0 to 44.9 in adult ([place]) 10/13/2016   Iron deficiency anemia due to chronic blood loss 04/22/2016    Conditions to be addressed/monitored per PCP order:  HTN  Care Plan : RN Care Manager Plan of Care  Updates made by Tonya Montane, RN since 06/13/2022 12:00 AM     Problem: Health Management needs related to HTN      Long-Range Goal: Development of Plan of Care to address Health Management needs related to HTN   Start Date: 03/11/2022  Expected End Date: 07/15/2022  Priority: High  Note:   Current Barriers:  Chronic Disease Management support and education needs related to HTN-Ms. Bassette reports BP improving since starting new medications. Last BP at home 122/98.  RNCM Clinical Goal(Wilkerson):  Patient will  verbalize understanding of plan for management of HTN as evidenced by patient reports take all medications exactly as prescribed and will call provider for medication related questions as evidenced by patient reports and EMR documentation    attend all scheduled medical appointments: PCP on 07/16/22 as evidenced by provider documentation          Interventions: Inter-disciplinary care team collaboration (see longitudinal plan of care) Evaluation of current treatment plan related to  self management and patient'Wilkerson adherence to plan as established by provider   Hypertension: (Status: Goal on Track (progressing): YES.) Long Term Goal Ms. Mallis had recent medication change, recent BP 122/98 Last practice recorded BP readings:  BP Readings from Last 3 Encounters:  06/03/22 (!) 135/94  05/16/22 (!) 180/137  05/01/21 (!) 142/99  Most recent eGFR/CrCl:  Lab Results  Component Value Date   EGFR 87 05/01/2021    No components found for: "CRCL"  Provided education to patient re: stroke prevention, Wilkerson/Wilkerson of heart attack and stroke; Reviewed prescribed diet increasing vegetables and fruit Reviewed medications with patient and discussed importance of compliance;  Counseled on the importance  of exercise goals with target of 150 minutes per week Discussed plans with patient for ongoing care management follow up and provided patient with direct contact information for care management team; Advised patient, providing education and rationale, to monitor blood pressure daily and record, calling PCP for findings outside established parameters;  Provided education on prescribed diet heart healthy;  Advised patient to check BP at home and record at least 3 times Wilkerson week, take record to next PCP visit Provided education on managing HTN Discussed the benefits in stress reduction and relaxation Advised patient to discuss concerns regarding CPAP with provider, discussed the benefits of CPAP compliance  Patient  Goals/Self-Care Activities: Take medications as prescribed   Attend all scheduled provider appointments Call provider office for new concerns or questions  Check BP at home and record       Follow Up:  Patient agrees to Care Plan and Follow-up.  Plan: The Managed Medicaid care management team will reach out to the patient again over the next 60 days.  Date/time of next scheduled RN care management/care coordination outreach:  08/15/22 @ 11:15am  Lurena Joiner RN, [place] [facility] RN Care Coordinator [phone]

## 2022-06-13 NOTE — Patient Instructions (Signed)
Visit Information  Ms. Runyon was given information about Medicaid Managed Care team care coordination services as a part of their Healthy [facility] Medicaid benefit. Rylee Motl verbally consented to engagement with the [facility] Managed Care team.   If you are experiencing a medical emergency, please call 911 or report to your local emergency department or urgent care.   If you have a non-emergency medical problem during routine business hours, please contact your provider's office and ask to speak with a nurse.   For questions related to your Healthy [facility] health plan, please call: [phone] or visit the homepage here: [email or web address]  If you would like to schedule transportation through your Healthy [facility] plan, please call the following number at least 2 days in advance of your appointment: [phone]  For information about your ride after you set it up, call Ride Assist at [phone]. Use this number to activate a Will Call pickup, or if your transportation is late for a scheduled pickup. Use this number, too, if you need to make a change or cancel a previously scheduled reservation.  If you need transportation services right away, call [phone]. The after-hours call center is staffed 24 hours to handle ride assistance and urgent reservation requests (including discharges) 365 days a year. Urgent trips include sick visits, hospital discharge requests and life-sustaining treatment.  Call the [place] at [phone], at any time, 24 hours a day, 7 days a week. If you are in danger or need immediate medical attention call 911.  If you would like help to quit smoking, call 1-800-QUIT-NOW (228)342-7158) OR Espaol: 1-855-Djelo-Ya QO:409462) o para ms informacin haga clic aqu or Text READY to 200-400 to register via text  Ms. Mcevers,   Please see education materials related to diet and CPAP compliance  provided by MyChart link.  Patient verbalizes understanding of instructions and care plan provided today and agrees to view in [place]. Active MyChart status and patient understanding of how to access instructions and care plan via MyChart confirmed with patient.     Telephone follow up appointment with Managed Medicaid care management team member scheduled for:08/15/22 @ 11:15am  Lurena Joiner RN, [facility] RN Care Coordinator [phone]   Following is a copy of your plan of care:  Care Plan : RN Care Manager Plan of Care  Updates made by Melissa Montane, RN since 06/13/2022 12:00 AM     Problem: Health Management needs related to HTN      Long-Range Goal: Development of Plan of Care to address Health Management needs related to HTN   Start Date: 03/11/2022  Expected End Date: 07/15/2022  Priority: High  Note:   Current Barriers:  Chronic Disease Management support and education needs related to HTN-Ms. Ticas reports BP improving since starting new medications. Last BP at home 122/98.  RNCM Clinical Goal(s):  Patient will verbalize understanding of plan for management of HTN as evidenced by patient reports take all medications exactly as prescribed and will call provider for medication related questions as evidenced by patient reports and EMR documentation    attend all scheduled medical appointments: PCP on 07/16/22 as evidenced by provider documentation          Interventions: Inter-disciplinary care team collaboration (see longitudinal plan of care) Evaluation of current treatment plan related to  self management and patient's adherence to plan as established by provider   Hypertension: (Status: Goal on Track (progressing): YES.) Long Term Goal Ms. Sapia had  recent medication change, recent BP 122/98 Last practice recorded BP readings:  BP Readings from Last 3 Encounters:  06/03/22 (!) 135/94  05/16/22 (!) 180/137  05/01/21 (!) 142/99  Most recent  eGFR/CrCl:  Lab Results  Component Value Date   EGFR 87 05/01/2021    No components found for: "CRCL"  Provided education to patient re: stroke prevention, s/s of heart attack and stroke; Reviewed prescribed diet increasing vegetables and fruit Reviewed medications with patient and discussed importance of compliance;  Counseled on the importance of exercise goals with target of 150 minutes per week Discussed plans with patient for ongoing care management follow up and provided patient with direct contact information for care management team; Advised patient, providing education and rationale, to monitor blood pressure daily and record, calling PCP for findings outside established parameters;  Provided education on prescribed diet heart healthy;  Advised patient to check BP at home and record at least 3 times a week, take record to next PCP visit Provided education on managing HTN Discussed the benefits in stress reduction and relaxation Advised patient to discuss concerns regarding CPAP with provider, discussed the benefits of CPAP compliance  Patient Goals/Self-Care Activities: Take medications as prescribed   Attend all scheduled provider appointments Call provider office for new concerns or questions  Check BP at home and record

## 2022-06-20 ENCOUNTER — Ambulatory Visit
Admission: RE | Admit: 2022-06-20 | Discharge: 2022-06-20 | Disposition: A | Discharge status: Home / Self Care | Payer: Medicaid Other | Source: Ambulatory Visit | Attending: Nurse Practitioner | Admitting: Nurse Practitioner

## 2022-06-20 ENCOUNTER — Other Ambulatory Visit: Payer: Self-pay | Admitting: Nurse Practitioner

## 2022-06-20 DIAGNOSIS — R928 Other abnormal and inconclusive findings on diagnostic imaging of breast: Secondary | ICD-10-CM

## 2022-06-20 DIAGNOSIS — H5213 Myopia, bilateral: Secondary | ICD-10-CM | POA: Diagnosis not present

## 2022-06-20 DIAGNOSIS — N6489 Other specified disorders of breast: Secondary | ICD-10-CM | POA: Diagnosis not present

## 2022-06-23 ENCOUNTER — Other Ambulatory Visit: Payer: Self-pay | Admitting: Nurse Practitioner

## 2022-06-23 DIAGNOSIS — N6320 Unspecified lump in the left breast, unspecified quadrant: Secondary | ICD-10-CM

## 2022-06-23 DIAGNOSIS — R928 Other abnormal and inconclusive findings on diagnostic imaging of breast: Secondary | ICD-10-CM

## 2022-06-25 ENCOUNTER — Encounter: Payer: Self-pay | Admitting: Nurse Practitioner

## 2022-06-25 ENCOUNTER — Ambulatory Visit
Admission: RE | Admit: 2022-06-25 | Discharge: 2022-06-25 | Disposition: A | Discharge status: Home / Self Care | Payer: Medicaid Other | Source: Ambulatory Visit | Attending: Nurse Practitioner | Admitting: Nurse Practitioner

## 2022-06-25 DIAGNOSIS — E119 Type 2 diabetes mellitus without complications: Secondary | ICD-10-CM

## 2022-06-25 DIAGNOSIS — R928 Other abnormal and inconclusive findings on diagnostic imaging of breast: Secondary | ICD-10-CM

## 2022-06-25 DIAGNOSIS — N6022 Fibroadenosis of left breast: Secondary | ICD-10-CM | POA: Diagnosis not present

## 2022-06-25 DIAGNOSIS — D242 Benign neoplasm of left breast: Secondary | ICD-10-CM | POA: Diagnosis not present

## 2022-06-25 DIAGNOSIS — N6320 Unspecified lump in the left breast, unspecified quadrant: Secondary | ICD-10-CM

## 2022-06-25 HISTORY — PX: BREAST BIOPSY: SHX20

## 2022-06-25 MED ORDER — OZEMPIC (0.25 OR 0.5 MG/DOSE) 2 MG/3ML ~~LOC~~ SOPN
0.2500 mg | PEN_INJECTOR | SUBCUTANEOUS | 1 refills | Status: DC
Start: 2022-06-25 — End: 2022-07-16

## 2022-07-04 ENCOUNTER — Other Ambulatory Visit: Payer: Self-pay | Admitting: Nurse Practitioner

## 2022-07-15 ENCOUNTER — Ambulatory Visit: Payer: Self-pay | Admitting: General Surgery

## 2022-07-15 DIAGNOSIS — N6489 Other specified disorders of breast: Secondary | ICD-10-CM

## 2022-07-16 ENCOUNTER — Ambulatory Visit: Payer: Medicaid Other | Attending: Nurse Practitioner | Admitting: Nurse Practitioner

## 2022-07-16 ENCOUNTER — Encounter: Payer: Self-pay | Admitting: Nurse Practitioner

## 2022-07-16 VITALS — BP 117/83 | HR 98 | Ht 72.0 in | Wt 339.4 lb

## 2022-07-16 DIAGNOSIS — I10 Essential (primary) hypertension: Secondary | ICD-10-CM | POA: Diagnosis not present

## 2022-07-16 DIAGNOSIS — E119 Type 2 diabetes mellitus without complications: Secondary | ICD-10-CM | POA: Diagnosis not present

## 2022-07-16 MED ORDER — AMLODIPINE-VALSARTAN-HCTZ 10-160-25 MG PO TABS
1.0000 | ORAL_TABLET | Freq: Every day | ORAL | 1 refills | Status: DC
Start: 2022-07-16 — End: 2023-01-29

## 2022-07-16 MED ORDER — OZEMPIC (0.25 OR 0.5 MG/DOSE) 2 MG/3ML ~~LOC~~ SOPN
0.5000 mg | PEN_INJECTOR | SUBCUTANEOUS | 1 refills | Status: DC
Start: 2022-07-16 — End: 2022-10-13

## 2022-07-16 NOTE — Progress Notes (Signed)
Assessment & Plan:  Aria was seen today for hypertension.  Diagnoses and all orders for this visit:  Primary hypertension Blood pressure is well controlled. Stop losartan. Recheck BPs daily and schedule video visit for BP log review.  Continue all antihypertensives as prescribed.  Reminded to bring in blood pressure log for follow  up appointment.  RECOMMENDATIONS: DASH/Mediterranean Diets are healthier choices for HTN.   -     amLODIPine-Valsartan-HCTZ 10-160-25 MG TABS; Take 1 tablet by mouth daily. -     CMP14+EGFR  Type 2 diabetes mellitus without complication, without long-term current use of insulin  Increase Ozempic today to 0.5 mg weekly and A1c has slightly increased.  Continue all other medications.  -     Semaglutide,0.25 or 0.5MG /DOS, (OZEMPIC, 0.25 OR 0.5 MG/DOSE,) 2 MG/3ML SOPN; Inject 0.5 mg into the skin once a week. -     CMP14+EGFR    Patient has been counseled on age-appropriate routine health concerns for screening and prevention. These are reviewed and up-to-date. Referrals have been placed accordingly. Immunizations are up-to-date or declined.    Subjective:   Chief Complaint  Patient presents with   Hypertension   [name] 52 y.o. female presents to office today for HTN.   She has a past medical history of Abnormal uterine bleeding (AUB), Anemia, Anxiety, Asthma, COVID (03/2020), Hypertension, Morbid obesity with BMI of 40.0-44.9, adult (HCC), Pneumonia due to infectious organism (2020), Sleep apnea (dx 2021), and DM 2    HTN Blood pressure is well controlled. She is taking amlodipine-valsartan-HCTZ 10-160-25 mg. She has also been taking losartan 50 mg daily and was not aware this was discontinued at her last visit.  BP Readings from Last 3 Encounters:  07/16/22 117/83  06/03/22 (!) 135/94  05/16/22 (!) 180/137    DM 2 Diabetes is weil controlled with ozempic 0.25 mg weekly and metformin 500 mg daily. Lab Results  Component Value Date    HGBA1C 6.5 (H) 05/16/2022    Review of Systems  Constitutional:  Negative for fever, malaise/fatigue and weight loss.  HENT: Negative.  Negative for nosebleeds.   Eyes: Negative.  Negative for blurred vision, double vision and photophobia.  Respiratory: Negative.  Negative for cough and shortness of breath.   Cardiovascular: Negative.  Negative for chest pain, palpitations and leg swelling.  Gastrointestinal: Negative.  Negative for heartburn, nausea and vomiting.  Musculoskeletal: Negative.  Negative for myalgias.  Neurological: Negative.  Negative for dizziness, focal weakness, seizures and headaches.  Psychiatric/Behavioral: Negative.  Negative for suicidal ideas.     Past Medical History:  Diagnosis Date   Abnormal uterine bleeding (AUB)    Anemia    Anxiety    Asthma    COVID 03/2020   mild cold symptoms x 3 days all symptoms resolved   Dyspnea    with exertion   History of blood transfusion yrs ago   after childbirth   History of pre-eclampsia 17 yrs ago   Hypertension    Morbid obesity with BMI of 40.0-44.9, adult (HCC)    Pneumonia due to infectious organism 2020   Sleep apnea dx 2021   did not tolerate cpap mild osa    Wears glasses     Past Surgical History:  Procedure Laterality Date   BREAST BIOPSY Left 06/25/2022   MM LT BREAST BX W LOC DEV 1ST LESION IMAGE BX SPEC STEREO GUIDE 06/25/2022 [facility] MAMMOGRAPHY   CYSTOSCOPY N/A 07/17/2020   Procedure: CYSTOSCOPY;  Surgeon: [name], MD;  Location: [facility];  Service: Gynecology;  Laterality: N/A;   ROBOTIC ASSISTED LAPAROSCOPIC HYSTERECTOMY AND SALPINGECTOMY Bilateral 07/17/2020   Procedure: XI ROBOTIC ASSISTED TOTAL HYSTERECTOMY WITH BILATERAL SALPINGECTOMY;  Surgeon: [name], MD;  Location: [facility] ;  Service: Gynecology;  Laterality: Bilateral;   TUBAL LIGATION  17 yrs ago    Family History  Problem Relation Age of Onset   Hypertension Mother     Stroke Mother    Diabetes Maternal Aunt    Cancer Maternal Grandfather     Social History Reviewed with no changes to be made today.   Outpatient Medications Prior to Visit  Medication Sig Dispense Refill   albuterol (VENTOLIN HFA) 108 (90 Base) MCG/ACT inhaler Inhale 1-2 puffs into the lungs every 4 (four) hours as needed for wheezing or shortness of breath. 18 g 2   Blood Pressure Monitor DEVI Please provide patient with insurance approved blood pressure monitor 1 each 0   busPIRone (BUSPAR) 15 MG tablet TAKE 1 TABLET(15 MG) BY MOUTH THREE TIMES DAILY 90 tablet 2   cetirizine (ZYRTEC) 10 MG tablet Take 1 tablet (10 mg total) by mouth daily. 90 tablet 3   fluticasone (FLONASE) 50 MCG/ACT nasal spray Place 2 sprays into both nostrils daily. 16 g 6   glucose blood (ACCU-CHEK GUIDE) test strip Use as instructed. Inject into the skin twice daily E11.65 200 each 12   ipratropium-albuterol (DUONEB) 0.5-2.5 (3) MG/3ML SOLN Take 3 mLs by nebulization every 4 (four) hours as needed. 360 mL 3   metFORMIN (GLUCOPHAGE) 500 MG tablet Take 1 tablet (500 mg total) by mouth daily with breakfast. 90 tablet 1   mometasone-formoterol (DULERA) 100-5 MCG/ACT AERO Inhale 2 puffs into the lungs 2 (two) times daily. 13 g 6   pravastatin (PRAVACHOL) 20 MG tablet TAKE 1 TABLET(20 MG) BY MOUTH DAILY 90 tablet 0   traZODone (DESYREL) 50 MG tablet Take 1/2 - 1 full tab PO QHS PRN 30 tablet 1   amLODIPine-Valsartan-HCTZ 10-160-12.5 MG TABS Take 1 tablet by mouth daily. 90 tablet 0   hydrochlorothiazide (HYDRODIURIL) 12.5 MG tablet Take 1 tablet (12.5 mg total) by mouth daily. 30 tablet 1   Semaglutide,0.25 or 0.5MG /DOS, (OZEMPIC, 0.25 OR 0.5 MG/DOSE,) 2 MG/3ML SOPN Inject 0.25 mg into the skin once a week. 3 mL 1   Blood Glucose Monitoring Suppl (ACCU-CHEK GUIDE) w/Device KIT 1 each by Does not apply route 2 (two) times daily. (Patient not taking: Reported on 07/16/2022) 1 kit 0   ibuprofen (ADVIL) 600 MG tablet Take 1  tablet (600 mg total) by mouth every 6 (six) hours. (Patient not taking: Reported on 07/16/2022) 40 tablet 1   oxymetazoline (NASAL SPRAY 12 HOUR) 0.05 % nasal spray Place 1 spray into both nostrils 2 (two) times daily. (Patient not taking: Reported on 06/13/2022) 30 mL 0   amLODipine-valsartan (EXFORGE) 10-320 MG tablet Take 1 tablet by mouth daily. (Patient not taking: Reported on 07/16/2022) 90 tablet 0   No facility-administered medications prior to visit.    No Known Allergies     Objective:    BP 117/83 (BP Location: Left Arm, Patient Position: Sitting, Cuff Size: Large)   Pulse 98   Ht 6' (1.829 m)   Wt (!) 339 lb 6.4 oz (154 kg)   LMP 06/15/2020   SpO2 98%   BMI 46.03 kg/m  Wt Readings from Last 3 Encounters:  07/16/22 (!) 339 lb 6.4 oz (154 kg)  06/03/22 (!) 337 lb (152.9  kg)  05/16/22 (!) 341 lb 12.8 oz (155 kg)    Physical Exam Vitals and nursing note reviewed.  Constitutional:      Appearance: She is well-developed.  HENT:     Head: Normocephalic and atraumatic.  Cardiovascular:     Rate and Rhythm: Normal rate and regular rhythm.     Heart sounds: Normal heart sounds. No murmur heard.    No friction rub. No gallop.  Pulmonary:     Effort: Pulmonary effort is normal. No tachypnea or respiratory distress.     Breath sounds: Normal breath sounds. No decreased breath sounds, wheezing, rhonchi or rales.  Chest:     Chest wall: No tenderness.  Abdominal:     General: Bowel sounds are normal.     Palpations: Abdomen is soft.  Musculoskeletal:        General: Normal range of motion.     Cervical back: Normal range of motion.  Skin:    General: Skin is warm and dry.  Neurological:     Mental Status: She is alert and oriented to person, place, and time.     Coordination: Coordination normal.  Psychiatric:        Behavior: Behavior normal. Behavior is cooperative.        Thought Content: Thought content normal.        Judgment: Judgment normal.           Patient has been counseled extensively about nutrition and exercise as well as the importance of adherence with medications and regular follow-up. The patient was given clear instructions to go to ER or return to medical center if symptoms don't improve, worsen or new problems develop. The patient verbalized understanding.   Follow-up: Return for video visit next friday db 1110 or 130 BP.   Claiborne Rigg, FNP-BC [facility] [place], [place] [phone]   07/16/2022, 3:01 PM

## 2022-07-16 NOTE — Progress Notes (Signed)
No concerns. 

## 2022-07-17 ENCOUNTER — Other Ambulatory Visit: Payer: Self-pay | Admitting: General Surgery

## 2022-07-17 DIAGNOSIS — N6489 Other specified disorders of breast: Secondary | ICD-10-CM

## 2022-07-17 LAB — CMP14+EGFR
ALT: 21 IU/L (ref 0–32)
AST: 21 IU/L (ref 0–40)
Albumin/Globulin Ratio: 1.6 (ref 1.2–2.2)
Albumin: 4.5 g/dL (ref 3.8–4.9)
Alkaline Phosphatase: 130 IU/L — ABNORMAL HIGH (ref 44–121)
BUN/Creatinine Ratio: 10 (ref 9–23)
BUN: 10 mg/dL (ref 6–24)
Bilirubin Total: 0.3 mg/dL (ref 0.0–1.2)
CO2: 22 mmol/L (ref 20–29)
Calcium: 9.8 mg/dL (ref 8.7–10.2)
Chloride: 98 mmol/L (ref 96–106)
Creatinine, Ser: 0.97 mg/dL (ref 0.57–1.00)
Globulin, Total: 2.9 g/dL (ref 1.5–4.5)
Glucose: 87 mg/dL (ref 70–99)
Potassium: 4.4 mmol/L (ref 3.5–5.2)
Sodium: 138 mmol/L (ref 134–144)
Total Protein: 7.4 g/dL (ref 6.0–8.5)
eGFR: 70 mL/min/{1.73_m2} (ref >59)

## 2022-07-21 DIAGNOSIS — H5203 Hypermetropia, bilateral: Secondary | ICD-10-CM | POA: Diagnosis not present

## 2022-07-25 ENCOUNTER — Telehealth (HOSPITAL_BASED_OUTPATIENT_CLINIC_OR_DEPARTMENT_OTHER): Payer: Medicaid Other | Admitting: Nurse Practitioner

## 2022-07-25 ENCOUNTER — Encounter: Payer: Self-pay | Admitting: Nurse Practitioner

## 2022-07-25 DIAGNOSIS — I10 Essential (primary) hypertension: Secondary | ICD-10-CM

## 2022-07-25 NOTE — Progress Notes (Signed)
Virtual Visit Note  I discussed the limitations, risks, security and privacy concerns of performing an evaluation and management service by video and the availability of in person appointments. I also discussed with the patient that there may be a patient responsible charge related to this service. The patient expressed understanding and agreed to proceed.    I connected with Tonya Wilkerson on 07/25/22  at   1:30 PM EDT  EDT by VIDEO and verified that I am speaking with the correct person using two identifiers.   Location of Patient: Private Residence   [facility] of Provider[facility]    Persons participating in VIRTUAL visit: Bertram Denver FNP-BC Yetunde Habib    History of Present Illness: VIRTUAL visit for: HTN She has a past medical history of Abnormal uterine bleeding (AUB), Anemia, Anxiety, Asthma, COVID (03/2020), Hypertension, Morbid obesity with BMI of 40.0-44.9, adult (HCC), Pneumonia due to infectious organism (2020), Sleep apnea (dx 2021), and DM 2   A few weeks ago we started Melysa on amlodipine-valsartan-hctz 10-160-25 mg. Blood pressures since then are below 114/90 130/86 TODAY 146/106  113/80 120/82 Hr 90s  At this time we will continue her on current does of antihypertensive.    Past Medical History:  Diagnosis Date   Abnormal uterine bleeding (AUB)    Anemia    Anxiety    Asthma    COVID 03/2020   mild cold symptoms x 3 days all symptoms resolved   Dyspnea    with exertion   History of blood transfusion yrs ago   after childbirth   History of pre-eclampsia 17 yrs ago   Hypertension    Morbid obesity with BMI of 40.0-44.9, adult (HCC)    Pneumonia due to infectious organism 2020   Sleep apnea dx 2021   did not tolerate cpap mild osa    Wears glasses     Past Surgical History:  Procedure Laterality Date   BREAST BIOPSY Left 06/25/2022   MM LT BREAST BX W LOC DEV 1ST LESION IMAGE BX SPEC STEREO GUIDE 06/25/2022  [facility] MAMMOGRAPHY   CYSTOSCOPY N/A 07/17/2020   Procedure: CYSTOSCOPY;  Surgeon: [name], MD;  Location: [facility];  Service: Gynecology;  Laterality: N/A;   ROBOTIC ASSISTED LAPAROSCOPIC HYSTERECTOMY AND SALPINGECTOMY Bilateral 07/17/2020   Procedure: XI ROBOTIC ASSISTED TOTAL HYSTERECTOMY WITH BILATERAL SALPINGECTOMY;  Surgeon: [name], MD;  Location: [facility];  Service: Gynecology;  Laterality: Bilateral;   TUBAL LIGATION  17 yrs ago    Family History  Problem Relation Age of Onset   Hypertension Mother    Stroke Mother    Diabetes Maternal Aunt    Cancer Maternal Grandfather     Social History   Socioeconomic History   Marital status: Divorced    Spouse name: Not on file   Number of children: Not on file   Years of education: Not on file   Highest education level: Not on file  Occupational History   Not on file  Tobacco Use   Smoking status: Former    Years: 2    Types: Cigarettes   Smokeless tobacco: Never   Tobacco comments:    social smoker   Vaping Use   Vaping Use: Never used  Substance and Sexual Activity   Alcohol use: Yes    Comment: daily 1-2 drinks per day   Drug use: No   Sexual activity: Not Currently    Birth control/protection: Surgical  Other Topics Concern  Not on file  Social History Narrative   Not on file   Social Determinants of Health   Financial Resource Strain: Not on file  Food Insecurity: No Food Insecurity (03/11/2022)   Hunger Vital Sign    Worried About Running Out of Food in the Last Year: Never true    Ran Out of Food in the Last Year: Never true  Transportation Needs: No Transportation Needs (06/13/2022)   PRAPARE - Administrator, Civil Service (Medical): No    Lack of Transportation (Non-Medical): No  Physical Activity: Not on file  Stress: Not on file  Social Connections: Not on file     Observations/Objective: Awake, alert and oriented x  3   Review of Systems  Constitutional:  Negative for fever, malaise/fatigue and weight loss.  HENT: Negative.  Negative for nosebleeds.   Eyes: Negative.  Negative for blurred vision, double vision and photophobia.  Respiratory: Negative.  Negative for cough and shortness of breath.   Cardiovascular: Negative.  Negative for chest pain, palpitations and leg swelling.  Gastrointestinal: Negative.  Negative for heartburn, nausea and vomiting.  Musculoskeletal: Negative.  Negative for myalgias.  Neurological: Negative.  Negative for dizziness, focal weakness, seizures and headaches.  Psychiatric/Behavioral: Negative.  Negative for suicidal ideas.     Assessment and Plan: Diagnoses and all orders for this visit:  Primary hypertension Continue all antihypertensives as prescribed.  Reminded to bring in blood pressure log for follow  up appointment.  RECOMMENDATIONS: DASH/Mediterranean Diets are healthier choices for HTN.      Follow Up Instructions Return in about 3 months (around 10/25/2022) for HTN.     I discussed the assessment and treatment plan with the patient. The patient was provided an opportunity to ask questions and all were answered. The patient agreed with the plan and demonstrated an understanding of the instructions.   The patient was advised to call back or seek an in-person evaluation if the symptoms worsen or if the condition fails to improve as anticipated.  I provided 11 minutes of face-to-face time during this encounter including median intraservice time, reviewing previous notes, labs, imaging, medications and explaining diagnosis and management.  Claiborne Rigg, FNP-BC

## 2022-08-09 ENCOUNTER — Other Ambulatory Visit: Payer: Self-pay | Admitting: Nurse Practitioner

## 2022-08-09 ENCOUNTER — Other Ambulatory Visit: Payer: Self-pay | Admitting: Physician Assistant

## 2022-08-09 DIAGNOSIS — G4709 Other insomnia: Secondary | ICD-10-CM

## 2022-08-09 DIAGNOSIS — I10 Essential (primary) hypertension: Secondary | ICD-10-CM

## 2022-08-12 NOTE — Telephone Encounter (Signed)
Not current dosing Requested Prescriptions  Pending Prescriptions Disp Refills   amLODIPine-Valsartan-HCTZ 10-160-12.5 MG TABS [Pharmacy Med Name: AMLO/VAL/HCTZ 10/160/12.5MG  TABLETS] 90 tablet 0    Sig: TAKE 1 TABLET BY MOUTH DAILY     Cardiovascular: CCB + ARB + Diuretic Combos Passed - 08/09/2022  6:24 AM      Passed - K in normal range and within 180 days    Potassium  Date Value Ref Range Status  07/16/2022 4.4 3.5 - 5.2 mmol/L Final         Passed - Na in normal range and within 180 days    Sodium  Date Value Ref Range Status  07/16/2022 138 134 - 144 mmol/L Final         Passed - Cr in normal range and within 180 days    Creatinine, Ser  Date Value Ref Range Status  07/16/2022 0.97 0.57 - 1.00 mg/dL Final         Passed - eGFR is 10 or above and within 180 days    GFR calc Af Amer  Date Value Ref Range Status  12/28/2019 90 >59 mL/min/1.73 Final    Comment:    **In accordance with recommendations from the NKF-ASN Task force,**   [facility] is in the process of updating its eGFR calculation to the   2021 CKD-EPI creatinine equation that estimates kidney function   without a race variable.    GFR, Estimated  Date Value Ref Range Status  07/13/2020 >60 >60 mL/min Final    Comment:    (NOTE) Calculated using the CKD-EPI Creatinine Equation (2021)    eGFR  Date Value Ref Range Status  07/16/2022 70 >59 mL/min/1.73 Final         Passed - Patient is not pregnant      Passed - Last BP in normal range    BP Readings from Last 1 Encounters:  07/16/22 117/83         Passed - Last Heart Rate in normal range    Pulse Readings from Last 1 Encounters:  07/16/22 98         Passed - Valid encounter within last 6 months    Recent Outpatient Visits           2 weeks ago Primary hypertension   [facility] [place], Shea Stakes, NP   3 weeks ago Primary hypertension   [facility] [place], Shea Stakes, NP    2 months ago Encounter for annual physical exam   [facility], Shea Stakes, NP   1 year ago Essential hypertension   [facility] [place], Shea Stakes, NP   1 year ago Bacterial sinusitis   [facility] [place], Shea Stakes, NP

## 2022-08-12 NOTE — Telephone Encounter (Signed)
Requested medication (s) are due for refill today: routing for review  Requested medication (s) are on the active medication list: yes  Last refill:  06/03/22  Future visit scheduled: no  Notes to clinic:  Unable to refill per protocol, last refill by Marguerita Merles at the mobile clinic. PCP listed as Thalia Party. Routing for review     Requested Prescriptions  Pending Prescriptions Disp Refills   traZODone (DESYREL) 50 MG tablet [Pharmacy Med Name: TRAZODONE 50MG  TABLETS] 30 tablet 1    Sig: TAKE HALF TO 1 TABLET BY MOUTH EVERY NIGHT AT BEDTIME AS NEEDED     There is no refill protocol information for this order

## 2022-08-14 DIAGNOSIS — J4522 Mild intermittent asthma with status asthmaticus: Secondary | ICD-10-CM | POA: Diagnosis not present

## 2022-08-14 DIAGNOSIS — E8881 Metabolic syndrome: Secondary | ICD-10-CM | POA: Diagnosis not present

## 2022-08-14 DIAGNOSIS — J4551 Severe persistent asthma with (acute) exacerbation: Secondary | ICD-10-CM | POA: Diagnosis not present

## 2022-08-14 DIAGNOSIS — E119 Type 2 diabetes mellitus without complications: Secondary | ICD-10-CM | POA: Diagnosis not present

## 2022-08-14 DIAGNOSIS — G4730 Sleep apnea, unspecified: Secondary | ICD-10-CM | POA: Diagnosis not present

## 2022-08-14 DIAGNOSIS — K760 Fatty (change of) liver, not elsewhere classified: Secondary | ICD-10-CM | POA: Diagnosis not present

## 2022-08-14 DIAGNOSIS — J45909 Unspecified asthma, uncomplicated: Secondary | ICD-10-CM | POA: Diagnosis not present

## 2022-08-14 DIAGNOSIS — E785 Hyperlipidemia, unspecified: Secondary | ICD-10-CM | POA: Diagnosis not present

## 2022-08-14 DIAGNOSIS — I10 Essential (primary) hypertension: Secondary | ICD-10-CM | POA: Diagnosis not present

## 2022-08-15 ENCOUNTER — Other Ambulatory Visit: Payer: Medicaid Other | Admitting: *Deleted

## 2022-08-15 DIAGNOSIS — Z136 Encounter for screening for cardiovascular disorders: Secondary | ICD-10-CM | POA: Diagnosis not present

## 2022-08-15 DIAGNOSIS — G4730 Sleep apnea, unspecified: Secondary | ICD-10-CM | POA: Diagnosis not present

## 2022-08-15 DIAGNOSIS — R5383 Other fatigue: Secondary | ICD-10-CM | POA: Diagnosis not present

## 2022-08-15 DIAGNOSIS — E8881 Metabolic syndrome: Secondary | ICD-10-CM | POA: Diagnosis not present

## 2022-08-15 DIAGNOSIS — E785 Hyperlipidemia, unspecified: Secondary | ICD-10-CM | POA: Diagnosis not present

## 2022-08-15 DIAGNOSIS — I10 Essential (primary) hypertension: Secondary | ICD-10-CM | POA: Diagnosis not present

## 2022-08-15 DIAGNOSIS — K760 Fatty (change of) liver, not elsewhere classified: Secondary | ICD-10-CM | POA: Diagnosis not present

## 2022-08-15 DIAGNOSIS — E119 Type 2 diabetes mellitus without complications: Secondary | ICD-10-CM | POA: Diagnosis not present

## 2022-08-15 DIAGNOSIS — J4522 Mild intermittent asthma with status asthmaticus: Secondary | ICD-10-CM | POA: Diagnosis not present

## 2022-08-15 NOTE — Patient Outreach (Signed)
Medicaid Managed Care   Nurse Care Manager Note  08/15/2022 Name:  Tonya Wilkerson MRN:  [id number] DOB:  02-21-1971  Tonya Wilkerson is an 52 y.o. year old female who is a primary patient of Claiborne Rigg, NP.  The [facility] Managed Care Coordination team was consulted for assistance with:    HTN Weight loss  Ms. Toth was given information about Medicaid Managed Care Coordination team services today. Tonya Wilkerson Patient agreed to services and verbal consent obtained.  Engaged with patient by telephone for follow up visit in response to provider referral for case management and/or care coordination services.   Assessments/Interventions:  Review of past medical history, allergies, medications, health status, including review of consultants reports, laboratory and other test data, was performed as part of comprehensive evaluation and provision of chronic care management services.  SDOH (Social Determinants of Health) assessments and interventions performed: SDOH Interventions    Flowsheet Row Patient Outreach Telephone from 06/13/2022 in [place] POPULATION HEALTH DEPARTMENT Patient Outreach Telephone from 03/11/2022 in [place] POPULATION HEALTH DEPARTMENT Telephone from 02/25/2022 in [place] POPULATION HEALTH DEPARTMENT  SDOH Interventions     Food Insecurity Interventions -- Intervention Not Indicated --  Housing Interventions -- Intervention Not Indicated --  Transportation Interventions Intervention Not Indicated Intervention Not Indicated Intervention Not Indicated  Utilities Interventions Intervention Not Indicated -- --       Care Plan  No Known Allergies  Medications Reviewed Today     Reviewed by Heidi Dach, RN (Registered Nurse) on 08/15/22 at 1125  Med List Status: <None>   Medication Order Taking? Sig Documenting Provider Last Dose Status Informant  albuterol (VENTOLIN HFA) 108 (90 Base) MCG/ACT inhaler [id number] Yes Inhale 1-2 puffs into the lungs every 4  (four) hours as needed for wheezing or shortness of breath. Claiborne Rigg, NP Taking Active   amLODIPine-Valsartan-HCTZ 10-160-25 MG TABS [id number] Yes Take 1 tablet by mouth daily. Claiborne Rigg, NP Taking Active   Blood Glucose Monitoring Suppl (ACCU-CHEK GUIDE) w/Device KIT [id number] Yes 1 each by Does not apply route 2 (two) times daily. Claiborne Rigg, NP Taking Active   Blood Pressure Monitor DEVI [id number] Yes Please provide patient with insurance approved blood pressure monitor Claiborne Rigg, NP Taking Active   busPIRone (BUSPAR) 15 MG tablet [id number] Yes TAKE 1 TABLET(15 MG) BY MOUTH THREE TIMES DAILY Hoy Register, MD Taking Active            Med Note (Chima Astorino, Consuella A   Fri Jun 13, 2022 11:41 AM) Taking as needed  cetirizine (ZYRTEC) 10 MG tablet [id number] Yes Take 1 tablet (10 mg total) by mouth daily. Claiborne Rigg, NP Taking Active   fluticasone [facility]) 50 MCG/ACT nasal spray [id number] Yes Place 2 sprays into both nostrils daily. Claiborne Rigg, NP Taking Active   glucose blood (ACCU-CHEK GUIDE) test strip [id number] Yes Use as instructed. Inject into the skin twice daily E11.65 Claiborne Rigg, NP Taking Active   ibuprofen (ADVIL) 600 MG tablet [id number] No Take 1 tablet (600 mg total) by mouth every 6 (six) hours.  Patient not taking: Reported on 08/15/2022   Tonya Rosenthal, MD Not Taking Active            Med Note (Mikell Camp, Johnathan A   Tue Mar 11, 2022  1:11 PM) As needed  ipratropium-albuterol (DUONEB) 0.5-2.5 (3) MG/3ML SOLN [id number] Yes Take 3 mLs by nebulization every 4 (four) hours as needed. Claiborne Rigg, NP Taking  Active   metFORMIN (GLUCOPHAGE) 500 MG tablet [id number] Yes Take 1 tablet (500 mg total) by mouth daily with breakfast. Claiborne Rigg, NP Taking Active   mometasone-formoterol [facility]) 100-5 MCG/ACT [name] [id number] Yes Inhale 2 puffs into the lungs 2 (two) times daily. Claiborne Rigg, NP Taking Active   oxymetazoline (NASAL SPRAY  12 HOUR) 0.05 % nasal spray [id number] No Place 1 spray into both nostrils 2 (two) times daily.  Patient not taking: Reported on 06/13/2022   Claiborne Rigg, NP Not Taking Active   pravastatin (PRAVACHOL) 20 MG tablet [id number] Yes TAKE 1 TABLET(20 MG) BY MOUTH DAILY Claiborne Rigg, NP Taking Active   Semaglutide,0.25 or 0.5MG /DOS, (OZEMPIC, 0.25 OR 0.5 MG/DOSE,) 2 MG/3ML SOPN [id number] Yes Inject 0.5 mg into the skin once a week. Claiborne Rigg, NP Taking Active   traZODone (DESYREL) 50 MG tablet [id number] Yes TAKE HALF TO 1 TABLET BY MOUTH EVERY NIGHT AT BEDTIME AS NEEDED Hoy Register, MD Taking Active             Patient Active Problem List   Diagnosis Date Noted   Complex sclerosing lesion of left breast 07/15/2022   Keloid scar 12/10/2020   Type 2 diabetes mellitus without complication, without long-term current use of insulin (HCC) 05/25/2018   Moderate asthma with exacerbation 05/23/2018   Adjustment disorder with mixed anxiety and depressed mood 02/02/2018   Essential hypertension 04/21/2017   Other insomnia 04/21/2017   Hematuria 10/13/2016   Hyperbilirubinemia 10/13/2016   Class 3 severe obesity due to excess calories with serious comorbidity and body mass index (BMI) of 40.0 to 44.9 in adult (HCC) 10/13/2016   Iron deficiency anemia due to chronic blood loss 04/22/2016   Dyspnea 11/20/2011    Conditions to be addressed/monitored per PCP order:  HTN and weight loss  Care Plan : RN Care Manager Plan of Care  Updates made by Heidi Dach, RN since 08/15/2022 12:00 AM     Problem: Health Management needs related to HTN      Long-Range Goal: Development of Plan of Care to address Health Management needs related to HTN   Start Date: 03/11/2022  Expected End Date: 10/24/2022  Priority: High  Note:   Current Barriers:  Chronic Disease Management support and education needs related to HTN-Ms. Hutt reports BP improving since starting new medications. Last BP  116/83. She had a Bariatric consult on 08/14/22 and is excited to start her weight loss journey.  RNCM Clinical Goal(s):  Patient will verbalize understanding of plan for management of HTN as evidenced by patient reports take all medications exactly as prescribed and will call provider for medication related questions as evidenced by patient reports and EMR documentation    attend all scheduled medical appointments: Bariatric Center 08/15/22, Colonoscopy 09/02/22, Breast Lumpectomy 09/05/22 as evidenced by provider documentation          Interventions: Inter-disciplinary care team collaboration (see longitudinal plan of care) Evaluation of current treatment plan related to  self management and patient's adherence to plan as established by provider Discussed Care Gaps, last Opthalmalogy exam 03/27/22, Colonoscopy scheduled 09/02/22 Collaborate with PCP re: referral for foot exam   Weight Loss Interventions:  (Status:  New goal.) Long Term Goal Provided verbal and/or written education to patient re: provider recommended life style modifications  Reviewed recommended dietary changes: avoid fad diets, make small/incremental dietary and exercise changes, eat at the table and avoid eating in front of the TV, plan management of cravings,  monitor snacking and cravings in food diary Reviewed and discussed provider notes from Bariatric consult    Hypertension: (Status: Goal on Track (progressing): YES.) Long Term Goal Ms. Tello had recent medication change, recent BP 122/98 Last practice recorded BP readings: recent BP during Bariatric consult 116/83 on 08/14/22 BP Readings from Last 3 Encounters:  07/16/22 117/83  06/03/22 (!) 135/94  05/16/22 (!) 180/137  Most recent eGFR/CrCl:  Lab Results  Component Value Date   EGFR 70 07/16/2022    No components found for: "CRCL"  Provided education to patient re: stroke prevention, s/s of heart attack and stroke; Reviewed prescribed diet increasing vegetables  and fruit Reviewed medications with patient and discussed importance of compliance;  Counseled on the importance of exercise goals with target of 150 minutes per week Discussed plans with patient for ongoing care management follow up and provided patient with direct contact information for care management team; Advised patient, providing education and rationale, to monitor blood pressure daily and record, calling PCP for findings outside established parameters;  Provided education on prescribed diet heart healthy;  Advised patient to check BP at home and record at least 3 times a week, take record to next PCP visit Provided education on managing HTN Discussed the benefits in stress reduction and relaxation  Patient Goals/Self-Care Activities: Take medications as prescribed   Attend all scheduled provider appointments Call provider office for new concerns or questions  Check BP at home and record       Follow Up:  Patient agrees to Care Plan and Follow-up.  Plan: The Managed Medicaid care management team will reach out to the patient again over the next 60 days.  Date/time of next scheduled RN care management/care coordination outreach:  10/20/22 @ 11:15 am  Estanislado Emms RN, BSN [place]  Managed [facility] RN Care Coordinator [phone]

## 2022-08-15 NOTE — Patient Instructions (Signed)
Visit Information  Tonya Wilkerson was given information about Medicaid Managed Care team care coordination services as a part of their Healthy [facility] Medicaid benefit. Tonya Wilkerson verbally consented to engagement with the [facility] Managed Care team.   If you are experiencing a medical emergency, please call 911 or report to your local emergency department or urgent care.   If you have a non-emergency medical problem during routine business hours, please contact your provider's office and ask to speak with a nurse.   For questions related to your Healthy [facility] health plan, please call: [phone] or visit the homepage here: [email or web address]  If you would like to schedule transportation through your Healthy [facility] plan, please call the following number at least 2 days in advance of your appointment: [phone]  For information about your ride after you set it up, call Ride Assist at [phone]. Use this number to activate a Will Call pickup, or if your transportation is late for a scheduled pickup. Use this number, too, if you need to make a change or cancel a previously scheduled reservation.  If you need transportation services right away, call [phone]. The after-hours call center is staffed 24 hours to handle ride assistance and urgent reservation requests (including discharges) 365 days a year. Urgent trips include sick visits, hospital discharge requests and life-sustaining treatment.  Call the [facility] Line at [phone], at any time, 24 hours a day, 7 days a week. If you are in danger or need immediate medical attention call 911.  If you would like help to quit smoking, call 1-800-QUIT-NOW (332-143-8332) OR Espaol: 1-855-Djelo-Ya (4-742-595-6387) o para ms informacin haga clic aqu or Text READY to 564-332 to register via text  Tonya Wilkerson,   Please see education materials related to weight loss provided by  MyChart link.  Patient verbalizes understanding of instructions and care plan provided today and agrees to view in MyChart. Active MyChart status and patient understanding of how to access instructions and care plan via MyChart confirmed with patient.     Telephone follow up appointment with Managed Medicaid care management team member scheduled for:10/20/22 @ 11:15 am  Estanislado Emms RN, BSN [place]  Managed [facility] RN Care Coordinator [phone]   Following is a copy of your plan of care:  Care Plan : RN Care Manager Plan of Care  Updates made by Tonya Dach, RN since 08/15/2022 12:00 AM     Problem: Health Management needs related to HTN      Long-Range Goal: Development of Plan of Care to address Health Management needs related to HTN   Start Date: 03/11/2022  Expected End Date: 10/24/2022  Priority: High  Note:   Current Barriers:  Chronic Disease Management support and education needs related to HTN-Tonya Wilkerson reports BP improving since starting new medications. Last BP 116/83. She had a Bariatric consult on 08/14/22 and is excited to start her weight loss journey.  RNCM Clinical Goal(s):  Patient will verbalize understanding of plan for management of HTN as evidenced by patient reports take all medications exactly as prescribed and will call provider for medication related questions as evidenced by patient reports and EMR documentation    attend all scheduled medical appointments: Bariatric Center 08/15/22, Colonoscopy 09/02/22, Breast Lumpectomy 09/05/22 as evidenced by provider documentation          Interventions: Inter-disciplinary care team collaboration (see longitudinal plan of care) Evaluation of current treatment plan related to  self management and patient's adherence to plan as  established by provider Discussed Care Gaps, last Opthalmalogy exam 03/27/22, Colonoscopy scheduled 09/02/22 Collaborate with PCP re: referral for foot exam   Weight Loss Interventions:   (Status:  New goal.) Long Term Goal Provided verbal and/or written education to patient re: provider recommended life style modifications  Reviewed recommended dietary changes: avoid fad diets, make small/incremental dietary and exercise changes, eat at the table and avoid eating in front of the TV, plan management of cravings, monitor snacking and cravings in food diary Reviewed and discussed provider notes from Bariatric consult    Hypertension: (Status: Goal on Track (progressing): YES.) Long Term Goal Tonya Wilkerson had recent medication change, recent BP 122/98 Last practice recorded BP readings: recent BP during Bariatric consult 116/83 on 08/14/22 BP Readings from Last 3 Encounters:  07/16/22 117/83  06/03/22 (!) 135/94  05/16/22 (!) 180/137  Most recent eGFR/CrCl:  Lab Results  Component Value Date   EGFR 70 07/16/2022    No components found for: "CRCL"  Provided education to patient re: stroke prevention, s/s of heart attack and stroke; Reviewed prescribed diet increasing vegetables and fruit Reviewed medications with patient and discussed importance of compliance;  Counseled on the importance of exercise goals with target of 150 minutes per week Discussed plans with patient for ongoing care management follow up and provided patient with direct contact information for care management team; Advised patient, providing education and rationale, to monitor blood pressure daily and record, calling PCP for findings outside established parameters;  Provided education on prescribed diet heart healthy;  Advised patient to check BP at home and record at least 3 times a week, take record to next PCP visit Provided education on managing HTN Discussed the benefits in stress reduction and relaxation  Patient Goals/Self-Care Activities: Take medications as prescribed   Attend all scheduled provider appointments Call provider office for new concerns or questions  Check BP at home and record

## 2022-08-18 ENCOUNTER — Other Ambulatory Visit: Payer: Self-pay | Admitting: Nurse Practitioner

## 2022-08-18 DIAGNOSIS — E119 Type 2 diabetes mellitus without complications: Secondary | ICD-10-CM

## 2022-08-28 ENCOUNTER — Ambulatory Visit: Payer: Medicaid Other | Admitting: Podiatry

## 2022-08-28 ENCOUNTER — Encounter (HOSPITAL_BASED_OUTPATIENT_CLINIC_OR_DEPARTMENT_OTHER): Payer: Self-pay

## 2022-08-28 ENCOUNTER — Encounter: Payer: Self-pay | Admitting: Podiatry

## 2022-08-28 ENCOUNTER — Encounter (HOSPITAL_BASED_OUTPATIENT_CLINIC_OR_DEPARTMENT_OTHER): Payer: Self-pay | Admitting: General Surgery

## 2022-08-28 ENCOUNTER — Other Ambulatory Visit: Payer: Self-pay

## 2022-08-28 DIAGNOSIS — E119 Type 2 diabetes mellitus without complications: Secondary | ICD-10-CM

## 2022-08-28 DIAGNOSIS — B351 Tinea unguium: Secondary | ICD-10-CM

## 2022-08-28 DIAGNOSIS — M79674 Pain in right toe(s): Secondary | ICD-10-CM | POA: Diagnosis not present

## 2022-08-28 DIAGNOSIS — M79675 Pain in left toe(s): Secondary | ICD-10-CM

## 2022-08-28 NOTE — Progress Notes (Signed)
  Subjective:  Patient ID: Tonya Wilkerson, female    DOB: [birth year],   MRN: [id number]  Chief Complaint  Patient presents with   Diabetes    Diabetic foot care / diabetic foot exam     52 y.o. female presents for concern of thickened elongated and painful nails that are difficult to trim. Requesting to have them trimmed today. Denies burning and tingling in their feet. Patient is diabetic and last A1c was  Lab Results  Component Value Date   HGBA1C 6.5 (H) 05/16/2022   .   PCP:  Claiborne Rigg, NP    . Denies any other pedal complaints. Denies n/v/f/c.   Past Medical History:  Diagnosis Date   Abnormal uterine bleeding (AUB)    Anemia    Anxiety    Asthma    COVID 03/2020   mild cold symptoms x 3 days all symptoms resolved   Dyspnea    with exertion   History of blood transfusion yrs ago   after childbirth   History of pre-eclampsia 17 yrs ago   Hypertension    Morbid obesity with BMI of 40.0-44.9, adult (HCC)    Pneumonia due to infectious organism 2020   Sleep apnea dx 2021   did not tolerate cpap mild osa    Wears glasses     Objective:  Physical Exam: Vascular: DP/PT pulses 2/4 bilateral. CFT <3 seconds. Absent hair growth on digits. Edema noted to bilateral lower extremities. Xerosis noted bilaterally.  Skin. No lacerations or abrasions bilateral feet. Nails 1-5 bilateral  are thickened discolored and elongated with subungual debris.  Musculoskeletal: MMT 5/5 bilateral lower extremities in DF, PF, Inversion and Eversion. Deceased ROM in DF of ankle joint.  Neurological: Sensation intact to light touch. Protective sensation intact bilateral.    Assessment:   1. Type 2 diabetes mellitus without complication, without long-term current use of insulin (HCC)   2. Pain due to onychomycosis of toenails of both feet      Plan:  Patient was evaluated and treated and all questions answered. -Discussed and educated patient on diabetic foot care, especially with   regards to the vascular, neurological and musculoskeletal systems.  -Stressed the importance of good glycemic control and the detriment of not  controlling glucose levels in relation to the foot. -Discussed supportive shoes at all times and checking feet regularly.  -Mechanically debrided all nails 1-5 bilateral using sterile nail nipper and filed with dremel without incident  -Answered all patient questions -Patient to return  in 1 year DM foot check -Patient advised to call the office if any problems or questions arise in the meantime.   Louann Sjogren, DPM

## 2022-08-29 ENCOUNTER — Encounter: Payer: Self-pay | Admitting: Nurse Practitioner

## 2022-09-01 NOTE — Telephone Encounter (Signed)
Please fax. Thanks

## 2022-09-02 DIAGNOSIS — K642 Third degree hemorrhoids: Secondary | ICD-10-CM | POA: Diagnosis not present

## 2022-09-02 DIAGNOSIS — I10 Essential (primary) hypertension: Secondary | ICD-10-CM | POA: Diagnosis not present

## 2022-09-02 DIAGNOSIS — J45909 Unspecified asthma, uncomplicated: Secondary | ICD-10-CM | POA: Diagnosis not present

## 2022-09-02 DIAGNOSIS — Z1211 Encounter for screening for malignant neoplasm of colon: Secondary | ICD-10-CM | POA: Diagnosis not present

## 2022-09-02 NOTE — Telephone Encounter (Signed)
Forms refaxed

## 2022-09-03 ENCOUNTER — Encounter (HOSPITAL_BASED_OUTPATIENT_CLINIC_OR_DEPARTMENT_OTHER)
Admission: RE | Admit: 2022-09-03 | Discharge: 2022-09-03 | Disposition: A | Discharge status: Home / Self Care | Payer: Medicaid Other | Source: Ambulatory Visit | Attending: General Surgery | Admitting: General Surgery

## 2022-09-03 ENCOUNTER — Other Ambulatory Visit: Payer: Self-pay | Admitting: Nurse Practitioner

## 2022-09-03 DIAGNOSIS — E119 Type 2 diabetes mellitus without complications: Secondary | ICD-10-CM | POA: Diagnosis not present

## 2022-09-03 DIAGNOSIS — G4733 Obstructive sleep apnea (adult) (pediatric): Secondary | ICD-10-CM

## 2022-09-03 DIAGNOSIS — J4541 Moderate persistent asthma with (acute) exacerbation: Secondary | ICD-10-CM

## 2022-09-03 LAB — BASIC METABOLIC PANEL
Anion gap: 11 (ref 5–15)
BUN: 11 mg/dL (ref 6–20)
CO2: 24 mmol/L (ref 22–32)
Calcium: 9 mg/dL (ref 8.9–10.3)
Chloride: 101 mmol/L (ref 98–111)
Creatinine, Ser: 1.07 mg/dL — ABNORMAL HIGH (ref 0.44–1.00)
GFR, Estimated: >60 mL/min (ref >60)
Glucose, Bld: 122 mg/dL — ABNORMAL HIGH (ref 70–99)
Potassium: 4.8 mmol/L (ref 3.5–5.1)
Sodium: 136 mmol/L (ref 135–145)

## 2022-09-03 MED ORDER — CHLORHEXIDINE GLUCONATE CLOTH 2 % EX PADS: 6.0000 | MEDICATED_PAD | Freq: Once | CUTANEOUS | Status: DC

## 2022-09-03 NOTE — Progress Notes (Signed)

## 2022-09-04 ENCOUNTER — Ambulatory Visit
Admission: RE | Admit: 2022-09-04 | Discharge: 2022-09-04 | Disposition: A | Discharge status: Home / Self Care | Payer: Medicaid Other | Source: Ambulatory Visit | Attending: General Surgery | Admitting: General Surgery

## 2022-09-04 DIAGNOSIS — N6489 Other specified disorders of breast: Secondary | ICD-10-CM | POA: Diagnosis not present

## 2022-09-04 HISTORY — PX: BREAST BIOPSY: SHX20

## 2022-09-05 ENCOUNTER — Other Ambulatory Visit: Payer: Self-pay

## 2022-09-05 ENCOUNTER — Ambulatory Visit (HOSPITAL_BASED_OUTPATIENT_CLINIC_OR_DEPARTMENT_OTHER): Payer: Medicaid Other | Admitting: Anesthesiology

## 2022-09-05 ENCOUNTER — Ambulatory Visit
Admission: RE | Admit: 2022-09-05 | Discharge: 2022-09-05 | Disposition: A | Discharge status: Home / Self Care | Payer: Medicaid Other | Source: Ambulatory Visit | Attending: General Surgery | Admitting: General Surgery

## 2022-09-05 ENCOUNTER — Ambulatory Visit (HOSPITAL_BASED_OUTPATIENT_CLINIC_OR_DEPARTMENT_OTHER)
Admission: RE | Admit: 2022-09-05 | Discharge: 2022-09-05 | Disposition: A | Discharge status: Home / Self Care | Payer: Medicaid Other | Attending: General Surgery | Admitting: General Surgery

## 2022-09-05 ENCOUNTER — Encounter (HOSPITAL_BASED_OUTPATIENT_CLINIC_OR_DEPARTMENT_OTHER): Payer: Self-pay | Admitting: General Surgery

## 2022-09-05 ENCOUNTER — Encounter (HOSPITAL_BASED_OUTPATIENT_CLINIC_OR_DEPARTMENT_OTHER)
Admission: RE | Disposition: A | Discharge status: Home / Self Care | Payer: Self-pay | Source: Home / Self Care | Attending: General Surgery

## 2022-09-05 DIAGNOSIS — N6489 Other specified disorders of breast: Secondary | ICD-10-CM | POA: Insufficient documentation

## 2022-09-05 DIAGNOSIS — J45909 Unspecified asthma, uncomplicated: Secondary | ICD-10-CM | POA: Diagnosis not present

## 2022-09-05 DIAGNOSIS — G4730 Sleep apnea, unspecified: Secondary | ICD-10-CM | POA: Diagnosis not present

## 2022-09-05 DIAGNOSIS — Z87891 Personal history of nicotine dependence: Secondary | ICD-10-CM | POA: Insufficient documentation

## 2022-09-05 DIAGNOSIS — Z6841 Body Mass Index (BMI) 40.0 and over, adult: Secondary | ICD-10-CM | POA: Insufficient documentation

## 2022-09-05 DIAGNOSIS — F419 Anxiety disorder, unspecified: Secondary | ICD-10-CM | POA: Diagnosis not present

## 2022-09-05 DIAGNOSIS — I10 Essential (primary) hypertension: Secondary | ICD-10-CM | POA: Insufficient documentation

## 2022-09-05 DIAGNOSIS — N62 Hypertrophy of breast: Secondary | ICD-10-CM | POA: Diagnosis not present

## 2022-09-05 DIAGNOSIS — N6012 Diffuse cystic mastopathy of left breast: Secondary | ICD-10-CM | POA: Diagnosis not present

## 2022-09-05 DIAGNOSIS — N6022 Fibroadenosis of left breast: Secondary | ICD-10-CM | POA: Insufficient documentation

## 2022-09-05 DIAGNOSIS — E119 Type 2 diabetes mellitus without complications: Secondary | ICD-10-CM | POA: Insufficient documentation

## 2022-09-05 DIAGNOSIS — R921 Mammographic calcification found on diagnostic imaging of breast: Secondary | ICD-10-CM | POA: Diagnosis not present

## 2022-09-05 DIAGNOSIS — N6082 Other benign mammary dysplasias of left breast: Secondary | ICD-10-CM | POA: Insufficient documentation

## 2022-09-05 DIAGNOSIS — Z01818 Encounter for other preprocedural examination: Secondary | ICD-10-CM

## 2022-09-05 HISTORY — DX: Type 2 diabetes mellitus without complications: E11.9

## 2022-09-05 HISTORY — PX: BREAST LUMPECTOMY WITH RADIOACTIVE SEED LOCALIZATION: SHX6424

## 2022-09-05 LAB — GLUCOSE, CAPILLARY
Glucose-Capillary: 99 mg/dL (ref 70–99)
Glucose-Capillary: 91 mg/dL (ref 70–99)

## 2022-09-05 SURGERY — BREAST LUMPECTOMY WITH RADIOACTIVE SEED LOCALIZATION
Anesthesia: General | Site: Breast | Laterality: Left

## 2022-09-05 MED ORDER — ACETAMINOPHEN 500 MG PO TABS
1000.0000 mg | ORAL_TABLET | ORAL | Status: AC
  Administered 2022-09-05: 1000 mg via ORAL

## 2022-09-05 MED ORDER — BUPIVACAINE-EPINEPHRINE (PF) 0.25% -1:200000 IJ SOLN
INTRAMUSCULAR | Status: DC | PRN
  Administered 2022-09-05: 20 mL

## 2022-09-05 MED ORDER — PHENYLEPHRINE 80 MCG/ML (10ML) SYRINGE FOR IV PUSH (FOR BLOOD PRESSURE SUPPORT)
PREFILLED_SYRINGE | INTRAVENOUS | Status: AC
  Filled 2022-09-05: qty 10

## 2022-09-05 MED ORDER — CELECOXIB 200 MG PO CAPS
200.0000 mg | ORAL_CAPSULE | ORAL | Status: AC
  Administered 2022-09-05: 200 mg via ORAL

## 2022-09-05 MED ORDER — ACETAMINOPHEN 500 MG PO TABS
ORAL_TABLET | ORAL | Status: AC
  Filled 2022-09-05: qty 2

## 2022-09-05 MED ORDER — PROPOFOL 10 MG/ML IV BOLUS
INTRAVENOUS | Status: AC
  Filled 2022-09-05: qty 20

## 2022-09-05 MED ORDER — OXYCODONE HCL 5 MG PO TABS: 5.0000 mg | ORAL_TABLET | Freq: Once | ORAL | Status: DC | PRN

## 2022-09-05 MED ORDER — FENTANYL CITRATE (PF) 100 MCG/2ML IJ SOLN
INTRAMUSCULAR | Status: AC
  Filled 2022-09-05: qty 2

## 2022-09-05 MED ORDER — FENTANYL CITRATE (PF) 100 MCG/2ML IJ SOLN
INTRAMUSCULAR | Status: DC | PRN
  Administered 2022-09-05: 25 ug via INTRAVENOUS
  Administered 2022-09-05: 75 ug via INTRAVENOUS

## 2022-09-05 MED ORDER — MIDAZOLAM HCL 2 MG/2ML IJ SOLN
INTRAMUSCULAR | Status: DC | PRN
  Administered 2022-09-05: 2 mg via INTRAVENOUS

## 2022-09-05 MED ORDER — PROPOFOL 10 MG/ML IV BOLUS
INTRAVENOUS | Status: DC | PRN
  Administered 2022-09-05: 250 mg via INTRAVENOUS

## 2022-09-05 MED ORDER — GABAPENTIN 300 MG PO CAPS
300.0000 mg | ORAL_CAPSULE | ORAL | Status: AC
  Administered 2022-09-05: 300 mg via ORAL

## 2022-09-05 MED ORDER — CEFAZOLIN IN SODIUM CHLORIDE 3-0.9 GM/100ML-% IV SOLN
INTRAVENOUS | Status: AC
  Filled 2022-09-05: qty 100

## 2022-09-05 MED ORDER — PHENYLEPHRINE 80 MCG/ML (10ML) SYRINGE FOR IV PUSH (FOR BLOOD PRESSURE SUPPORT)
PREFILLED_SYRINGE | INTRAVENOUS | Status: DC | PRN
  Administered 2022-09-05 (×2): 80 ug via INTRAVENOUS

## 2022-09-05 MED ORDER — MIDAZOLAM HCL 2 MG/2ML IJ SOLN
INTRAMUSCULAR | Status: AC
  Filled 2022-09-05: qty 2

## 2022-09-05 MED ORDER — GABAPENTIN 300 MG PO CAPS
ORAL_CAPSULE | ORAL | Status: AC
  Filled 2022-09-05: qty 1

## 2022-09-05 MED ORDER — CELECOXIB 200 MG PO CAPS
ORAL_CAPSULE | ORAL | Status: AC
  Filled 2022-09-05: qty 1

## 2022-09-05 MED ORDER — LIDOCAINE 2% (20 MG/ML) 5 ML SYRINGE
INTRAMUSCULAR | Status: DC | PRN
  Administered 2022-09-05: 80 mg via INTRAVENOUS

## 2022-09-05 MED ORDER — ONDANSETRON HCL 4 MG/2ML IJ SOLN: 4.0000 mg | Freq: Four times a day (QID) | INTRAMUSCULAR | Status: DC | PRN

## 2022-09-05 MED ORDER — FENTANYL CITRATE (PF) 100 MCG/2ML IJ SOLN: 25.0000 ug | INTRAMUSCULAR | Status: DC | PRN

## 2022-09-05 MED ORDER — OXYCODONE HCL 5 MG PO TABS
5.0000 mg | ORAL_TABLET | Freq: Four times a day (QID) | ORAL | 0 refills | Status: DC | PRN
Start: 2022-09-05 — End: 2023-01-29

## 2022-09-05 MED ORDER — OXYCODONE HCL 5 MG/5ML PO SOLN: 5.0000 mg | Freq: Once | ORAL | Status: DC | PRN

## 2022-09-05 MED ORDER — DEXAMETHASONE SODIUM PHOSPHATE 10 MG/ML IJ SOLN
INTRAMUSCULAR | Status: DC | PRN
  Administered 2022-09-05: 5 mg via INTRAVENOUS

## 2022-09-05 MED ORDER — LACTATED RINGERS IV SOLN: INTRAVENOUS | Status: DC

## 2022-09-05 MED ORDER — ONDANSETRON HCL 4 MG/2ML IJ SOLN
INTRAMUSCULAR | Status: DC | PRN
  Administered 2022-09-05: 4 mg via INTRAVENOUS

## 2022-09-05 MED ORDER — CEFAZOLIN IN SODIUM CHLORIDE 3-0.9 GM/100ML-% IV SOLN
3.0000 g | INTRAVENOUS | Status: AC
  Administered 2022-09-05: 3 g via INTRAVENOUS

## 2022-09-05 MED ORDER — LIDOCAINE 2% (20 MG/ML) 5 ML SYRINGE
INTRAMUSCULAR | Status: AC
  Filled 2022-09-05: qty 5

## 2022-09-05 SURGICAL SUPPLY — 39 items
ADH SKN CLS APL DERMABOND .7 (GAUZE/BANDAGES/DRESSINGS) ×1
APL PRP STRL LF DISP 70% ISPRP (MISCELLANEOUS) ×1
APPLIER CLIP 9.375 MED OPEN (MISCELLANEOUS)
APR CLP MED 9.3 20 MLT OPN (MISCELLANEOUS)
BLADE SURG 15 STRL LF DISP TIS (BLADE) ×1 IMPLANT
BLADE SURG 15 STRL SS (BLADE) ×1
CANISTER SUC SOCK COL 7IN (MISCELLANEOUS) ×1 IMPLANT
CANISTER SUCT 1200ML W/VALVE (MISCELLANEOUS) ×1 IMPLANT
CHLORAPREP W/TINT 26 (MISCELLANEOUS) ×1 IMPLANT
CLIP APPLIE 9.375 MED OPEN (MISCELLANEOUS) IMPLANT
COVER BACK TABLE 60X90IN (DRAPES) ×1 IMPLANT
COVER MAYO STAND STRL (DRAPES) ×1 IMPLANT
COVER PROBE CYLINDRICAL 5X96 (MISCELLANEOUS) ×1 IMPLANT
DERMABOND ADVANCED .7 DNX12 (GAUZE/BANDAGES/DRESSINGS) ×1 IMPLANT
DRAPE LAPAROSCOPIC ABDOMINAL (DRAPES) ×1 IMPLANT
DRAPE UTILITY XL STRL (DRAPES) ×1 IMPLANT
ELECT COATED BLADE 2.86 ST (ELECTRODE) ×1 IMPLANT
ELECT REM PT RETURN 9FT ADLT (ELECTROSURGICAL) ×1
ELECTRODE REM PT RTRN 9FT ADLT (ELECTROSURGICAL) ×1 IMPLANT
GLOVE BIO SURGEON STRL SZ7.5 (GLOVE) ×2 IMPLANT
GOWN STRL REUS W/ TWL LRG LVL3 (GOWN DISPOSABLE) ×2 IMPLANT
GOWN STRL REUS W/TWL LRG LVL3 (GOWN DISPOSABLE) ×2
KIT MARKER MARGIN INK (KITS) ×1 IMPLANT
NDL HYPO 25X1 1.5 SAFETY (NEEDLE) IMPLANT
NEEDLE HYPO 25X1 1.5 SAFETY (NEEDLE) ×1 IMPLANT
NS IRRIG 1000ML POUR BTL (IV SOLUTION) IMPLANT
PACK BASIN DAY SURGERY FS (CUSTOM PROCEDURE TRAY) ×1 IMPLANT
PENCIL SMOKE EVACUATOR (MISCELLANEOUS) ×1 IMPLANT
SLEEVE SCD COMPRESS KNEE MED (STOCKING) ×1 IMPLANT
SPIKE FLUID TRANSFER (MISCELLANEOUS) IMPLANT
SPONGE T-LAP 18X18 ~~LOC~~+RFID (SPONGE) ×1 IMPLANT
SUT MON AB 4-0 PC3 18 (SUTURE) ×1 IMPLANT
SUT SILK 2 0 SH (SUTURE) IMPLANT
SUT VICRYL 3-0 CR8 SH (SUTURE) ×1 IMPLANT
SYR CONTROL 10ML LL (SYRINGE) IMPLANT
TOWEL GREEN STERILE FF (TOWEL DISPOSABLE) ×1 IMPLANT
TRAY FAXITRON CT DISP (TRAY / TRAY PROCEDURE) ×1 IMPLANT
TUBE CONNECTING 20X1/4 (TUBING) ×1 IMPLANT
YANKAUER SUCT BULB TIP NO VENT (SUCTIONS) IMPLANT

## 2022-09-05 NOTE — H&P (Signed)
REFERRING PHYSICIAN: Bertram Denver, NP PROVIDER: Lindell Noe, MD MRN: [id number] DOB: [birth year] Subjective   Chief Complaint: New Consultation ( Left Breast)  History of Present Illness: Tonya Wilkerson is a 52 y.o. female who is seen today as an office consultation for evaluation of New Consultation ( Left Breast)  We are asked to see the patient in consultation by Dr. Meredeth Ide to evaluate her for a complex sclerosing lesion. The patient is a 52 year old black female who recently went for a routine screening mammogram. At that time she was found to have a 2.3 cm mass in the lateral aspect of the left breast. This was biopsied and came back as a complex sclerosing lesion. She has no family history of breast cancer. She is diabetic and takes Ozempic. She does not smoke.  Review of Systems: A complete review of systems was obtained from the patient. I have reviewed this information and discussed as appropriate with the patient. See HPI as well for other ROS.  ROS   Medical History: Past Medical History:  Diagnosis Date  Anemia  Anxiety  Asthma, unspecified asthma severity, unspecified whether complicated, unspecified whether persistent (HHS-HCC)  Sleep apnea   Patient Active Problem List  Diagnosis  Complex sclerosing lesion of left breast   Past Surgical History:  Procedure Laterality Date  HYSTERECTOMY    No Known Allergies  Current Outpatient Medications on File Prior to Visit  Medication Sig Dispense Refill  amLODIPine-valsartan-hcthiazid 10-160-12.5 mg Tab Take 1 tablet by mouth once daily  cetirizine (ZYRTEC) 10 MG tablet Take 1 tablet by mouth once daily  hydroCHLOROthiazide (HYDRODIURIL) 12.5 MG tablet Take 12.5 mg by mouth once daily  losartan (COZAAR) 50 MG tablet  metFORMIN (GLUCOPHAGE) 500 MG tablet  mometasone-formoterol (DULERA) 100-5 mcg/actuation inhaler Inhale 2 inhalations into the lungs 2 (two) times daily  pravastatin (PRAVACHOL) 20 MG tablet Take 20  mg by mouth once daily  traZODone (DESYREL) 50 MG tablet Take 1/2 - 1 full tab PO QHS PRN   No current facility-administered medications on file prior to visit.   Family History  Problem Relation Age of Onset  High blood pressure (Hypertension) Mother  High blood pressure (Hypertension) Father    Social History   Tobacco Use  Smoking Status Former  Types: Cigarettes  Smokeless Tobacco Never    Social History   Socioeconomic History  Marital status: Divorced  Tobacco Use  Smoking status: Former  Types: Cigarettes  Smokeless tobacco: Never  Vaping Use  Vaping status: Never Used  Substance and Sexual Activity  Alcohol use: Yes  Drug use: Never   Social Determinants of Health   Food Insecurity: No Food Insecurity (03/11/2022)  Received from [facility]  Hunger Vital Sign  Worried About Running Out of Food in the Last Year: Never true  Ran Out of Food in the Last Year: Never true  Transportation Needs: No Transportation Needs (06/13/2022)  Received [facility] - [facility]  Lack of Transportation (Medical): No  Lack of Transportation (Non-Medical): No  Received from [facility]  Social Network   Objective:   Vitals:  BP: 122/89  Pulse: 94  Temp: 36.8 C (98.2 F)  SpO2: 96%  Weight: (!) 154.3 kg (340 lb 3.2 oz)  Height: 185.4 cm (6\' 1" )  PainSc: 0-No pain   Body mass index is 44.88 kg/m.  Physical Exam Vitals reviewed.  Constitutional:  General: She is not in acute distress. Appearance: Normal appearance.  HENT:  Head: Normocephalic and atraumatic.  Right Ear: External ear normal.  Left Ear: External ear normal.  Nose: Nose normal.  Mouth/Throat:  Mouth: Mucous membranes are moist.  Pharynx: Oropharynx is clear.  Eyes:  General: No scleral icterus. Extraocular Movements: Extraocular movements intact.  Conjunctiva/sclera: Conjunctivae normal.  Pupils: Pupils are equal, round, and reactive to light.  Cardiovascular:  Rate  and Rhythm: Normal rate and regular rhythm.  Pulses: Normal pulses.  Heart sounds: Normal heart sounds.  Pulmonary:  Effort: Pulmonary effort is normal. No respiratory distress.  Breath sounds: Normal breath sounds.  Abdominal:  General: Bowel sounds are normal.  Palpations: Abdomen is soft.  Tenderness: There is no abdominal tenderness.  Musculoskeletal:  General: No swelling, tenderness or deformity. Normal range of motion.  Cervical back: Normal range of motion and neck supple.  Skin: General: Skin is warm and dry.  Coloration: Skin is not jaundiced.  Neurological:  General: No focal deficit present.  Mental Status: She is alert and oriented to person, place, and time.  Psychiatric:  Mood and Affect: Mood normal.  Behavior: Behavior normal.     Breast: There is no palpable mass in either breast. There is no palpable axillary, supraclavicular, or cervical lymphadenopathy.  Labs, Imaging and Diagnostic Testing:  Assessment and Plan:   Diagnoses and all orders for this visit:  Complex sclerosing lesion of left breast   The patient appears to have a 2.3 cm area of complex sclerosing lesion in the lateral aspect of the left breast. Because of the size of the area involved there is a 5 to 10% chance of missing something more significant and the recommendation is to have this area removed. I have discussed with her in detail the risk and benefits of the operation as well as some of the technical aspects including the use of a radioactive seed for localization and she understands and wishes to proceed. We will continue with surgical scheduling.

## 2022-09-05 NOTE — Anesthesia Procedure Notes (Signed)
Procedure Name: LMA Insertion Date/Time: 09/05/2022 8:57 AM  Performed by: Francie Massing, CRNAPre-anesthesia Checklist: Patient identified, Emergency Drugs available, Suction available and Patient being monitored Patient Re-evaluated:Patient Re-evaluated prior to induction Oxygen Delivery Method: Circle system utilized Preoxygenation: Pre-oxygenation with 100% oxygen Induction Type: IV induction Ventilation: Mask ventilation without difficulty LMA: LMA with gastric port inserted LMA Size: 4.0 Number of attempts: 1 Airway Equipment and Method: Bite block Placement Confirmation: positive ETCO2 Tube secured with: Tape Dental Injury: Teeth and Oropharynx as per pre-operative assessment

## 2022-09-05 NOTE — Op Note (Signed)
09/05/2022  9:35 AM  PATIENT:  Tonya Wilkerson  52 y.o. female  PRE-OPERATIVE DIAGNOSIS:  LEFT BREAST CSL  POST-OPERATIVE DIAGNOSIS:  LEFT BREAST CSL  PROCEDURE:  Procedure(s): LEFT BREAST LUMPECTOMY WITH RADIOACTIVE SEED LOCALIZATION (Left)  SURGEON:  Surgeon(s) and Role:    * Griselda Miner, MD - Primary  PHYSICIAN ASSISTANT:   ASSISTANTS: none   ANESTHESIA:   local and general  EBL:  minimal   BLOOD ADMINISTERED:none  DRAINS: none   LOCAL MEDICATIONS USED:  MARCAINE     SPECIMEN:  Source of Specimen:  left breast tissue  DISPOSITION OF SPECIMEN:  PATHOLOGY  COUNTS:  YES  TOURNIQUET:  * No tourniquets in log *  DICTATION: .Dragon Dictation  After informed consent was obtained the patient was brought to the operating room and placed in the supine position on the operating table.  After adequate induction of general anesthesia the patient's left breast was prepped with ChloraPrep, allowed to dry, and draped in usual sterile manner.  An appropriate timeout was performed.  Previously an I-125 seed was placed in the outer aspect of the left breast to mark an area of complex sclerosing lesion.  The neoprobe was set to I-125 in the area of radioactivity was readily identified.  The area around this was infiltrated with quarter percent Marcaine.  A curvilinear incision was then made with a 15 blade knife along the outer edge of the areola of the left breast.  The incision was carried through the skin and subcutaneous tissue sharply with the electrocautery.  Dissection was then carried through the breast tissue sharply with the electrocautery under the direction of the neoprobe.  Once I more closely approach the radioactive seed I then removed a circular portion of breast tissue sharply with the electrocautery around the radioactive seed while checking the area of radioactivity frequently.  Once the specimen was removed it was oriented with the appropriate paint colors.  A specimen  radiograph was obtained that showed the clip and seed to be within the specimen.  The specimen was then sent to pathology for further evaluation.  Hemostasis was achieved using the Bovie electrocautery.  The wound then irrigated with saline and infiltrated with quarter percent Marcaine.  The deep layer of the wound was then closed with interrupted 3-0 Vicryl stitches.  The skin was then closed with interrupted 4-0 Monocryl subcuticular stitches.  Dermabond dressings were applied.  The patient tolerated the procedure well.  At the end of the case all needle sponge and instrument counts were correct.  The patient was then awakened and taken to recovery in stable condition.  PLAN OF CARE: Discharge to home after PACU  PATIENT DISPOSITION:  PACU - hemodynamically stable.   Delay start of Pharmacological VTE agent (>24hrs) due to surgical blood loss or risk of bleeding: not applicable

## 2022-09-05 NOTE — Discharge Instructions (Signed)
No Tylenol or ibuprofen until after 1:15pm today if needed Post Anesthesia Home Care Instructions  Activity: Get plenty of rest for the remainder of the day. A responsible individual must stay with you for 24 hours following the procedure.  For the next 24 hours, DO NOT: -Drive a car -Operate machinery -Drink alcoholic beverages -Take any medication unless instructed by your physician -Make any legal decisions or sign important papers.  Meals: Start with liquid foods such as gelatin or soup. Progress to regular foods as tolerated. Avoid greasy, spicy, heavy foods. If nausea and/or vomiting occur, drink only clear liquids until the nausea and/or vomiting subsides. Call your physician if vomiting continues.  Special Instructions/Symptoms: Your throat may feel dry or sore from the anesthesia or the breathing tube placed in your throat during surgery. If this causes discomfort, gargle with warm salt water. The discomfort should disappear within 24 hours.  If you had a scopolamine patch placed behind your ear for the management of post- operative nausea and/or vomiting:  1. The medication in the patch is effective for 72 hours, after which it should be removed.  Wrap patch in a tissue and discard in the trash. Wash hands thoroughly with soap and water. 2. You may remove the patch earlier than 72 hours if you experience unpleasant side effects which may include dry mouth, dizziness or visual disturbances. 3. Avoid touching the patch. Wash your hands with soap and water after contact with the patch.     

## 2022-09-05 NOTE — Anesthesia Preprocedure Evaluation (Signed)
Anesthesia Evaluation  Patient identified by MRN, date of birth, ID band Patient awake    Reviewed: Allergy & Precautions, H&P , NPO status , Patient's Chart, lab work & pertinent test results  Airway Mallampati: II   Neck ROM: full    Dental   Pulmonary asthma , sleep apnea , former smoker   breath sounds clear to auscultation       Cardiovascular hypertension,  Rhythm:regular Rate:Normal     Neuro/Psych  PSYCHIATRIC DISORDERS Anxiety        GI/Hepatic   Endo/Other  diabetes, Type 2  Morbid obesity  Renal/GU      Musculoskeletal   Abdominal   Peds  Hematology   Anesthesia Other Findings   Reproductive/Obstetrics                             Anesthesia Physical Anesthesia Plan  ASA: 3  Anesthesia Plan: General   Post-op Pain Management:    Induction: Intravenous  PONV Risk Score and Plan: 3 and Ondansetron, Dexamethasone, Midazolam and Treatment may vary due to age or medical condition  Airway Management Planned: LMA  Additional Equipment:   Intra-op Plan:   Post-operative Plan: Extubation in OR  Informed Consent: I have reviewed the patients History and Physical, chart, labs and discussed the procedure including the risks, benefits and alternatives for the proposed anesthesia with the patient or authorized representative who has indicated his/her understanding and acceptance.     Dental advisory given  Plan Discussed with: CRNA, Anesthesiologist and Surgeon  Anesthesia Plan Comments:        Anesthesia Quick Evaluation

## 2022-09-05 NOTE — Anesthesia Procedure Notes (Signed)
Procedure Name: LMA Insertion Date/Time: 09/05/2022 8:57 AM  Performed by: Francie Massing, CRNAPre-anesthesia Checklist: Patient identified, Emergency Drugs available, Suction available and Patient being monitored Patient Re-evaluated:Patient Re-evaluated prior to induction Oxygen Delivery Method: Circle system utilized Preoxygenation: Pre-oxygenation with 100% oxygen Induction Type: IV induction Ventilation: Mask ventilation without difficulty LMA: LMA inserted and LMA with gastric port inserted LMA Size: 4.0 Number of attempts: 1 Airway Equipment and Method: Bite block Placement Confirmation: positive ETCO2 Tube secured with: Tape Dental Injury: Teeth and Oropharynx as per pre-operative assessment

## 2022-09-05 NOTE — Transfer of Care (Signed)
Immediate Anesthesia Transfer of Care Note  Patient: Tonya Wilkerson  Procedure(s) Performed: Procedure(s) (LRB): LEFT BREAST LUMPECTOMY WITH RADIOACTIVE SEED LOCALIZATION (Left)  Patient Location: PACU  Anesthesia Type: General  Level of Consciousness: awake, oriented, sedated and patient cooperative  Airway & Oxygen Therapy: Patient Spontanous Breathing   Post-op Assessment: Report given to PACU RN and Post -op Vital signs reviewed and stable  Post vital signs: Reviewed and stable  Complications: No apparent anesthesia complications Last Vitals:  Vitals Value Taken Time  BP 108/77 09/05/22 0945  Temp    Pulse 93 09/05/22 0946  Resp 17 09/05/22 0946  SpO2 97 % 09/05/22 0946  Vitals shown include unvalidated device data.  Last Pain:  Vitals:   09/05/22 0709  TempSrc: Oral  PainSc: 0-No pain      Patients Stated Pain Goal: 3 (09/05/22 0709)  Complications: No notable events documented.

## 2022-09-05 NOTE — Interval H&P Note (Signed)
History and Physical Interval Note:  09/05/2022 8:21 AM  Tonya Wilkerson  has presented today for surgery, with the diagnosis of LEFT BREAST CSL.  The various methods of treatment have been discussed with the patient and family. After consideration of risks, benefits and other options for treatment, the patient has consented to  Procedure(s): LEFT BREAST LUMPECTOMY WITH RADIOACTIVE SEED LOCALIZATION (Left) as a surgical intervention.  The patient's history has been reviewed, patient examined, no change in status, stable for surgery.  I have reviewed the patient's chart and labs.  Questions were answered to the patient's satisfaction.     [name]

## 2022-09-05 NOTE — Anesthesia Postprocedure Evaluation (Signed)
Anesthesia Post Note  Patient: TEFL teacher  Procedure(s) Performed: LEFT BREAST LUMPECTOMY WITH RADIOACTIVE SEED LOCALIZATION (Left: Breast)     Patient location during evaluation: PACU Anesthesia Type: General Level of consciousness: awake and alert Pain management: pain level controlled Vital Signs Assessment: post-procedure vital signs reviewed and stable Respiratory status: spontaneous breathing, nonlabored ventilation, respiratory function stable and patient connected to nasal cannula oxygen Cardiovascular status: blood pressure returned to baseline and stable Postop Assessment: no apparent nausea or vomiting Anesthetic complications: no   No notable events documented.  Last Vitals:  Vitals:   09/05/22 1000 09/05/22 1011  BP: 112/64 (!) 127/93  Pulse: 84 81  Resp: (!) 21 20  Temp:  (!) 36.4 C  SpO2: 95% 98%    Last Pain:  Vitals:   09/05/22 1011  TempSrc:   PainSc: 0-No pain                 Aden Sek S

## 2022-09-08 ENCOUNTER — Encounter (HOSPITAL_BASED_OUTPATIENT_CLINIC_OR_DEPARTMENT_OTHER): Payer: Self-pay | Admitting: General Surgery

## 2022-09-08 DIAGNOSIS — R079 Chest pain, unspecified: Secondary | ICD-10-CM | POA: Diagnosis not present

## 2022-09-08 DIAGNOSIS — I10 Essential (primary) hypertension: Secondary | ICD-10-CM | POA: Diagnosis not present

## 2022-09-08 DIAGNOSIS — G4733 Obstructive sleep apnea (adult) (pediatric): Secondary | ICD-10-CM | POA: Diagnosis not present

## 2022-09-08 DIAGNOSIS — E785 Hyperlipidemia, unspecified: Secondary | ICD-10-CM | POA: Diagnosis not present

## 2022-09-08 DIAGNOSIS — R5383 Other fatigue: Secondary | ICD-10-CM | POA: Diagnosis not present

## 2022-09-08 LAB — SURGICAL PATHOLOGY

## 2022-09-16 DIAGNOSIS — G4710 Hypersomnia, unspecified: Secondary | ICD-10-CM | POA: Diagnosis not present

## 2022-09-16 DIAGNOSIS — Z6841 Body Mass Index (BMI) 40.0 and over, adult: Secondary | ICD-10-CM | POA: Diagnosis not present

## 2022-10-07 DIAGNOSIS — Z6841 Body Mass Index (BMI) 40.0 and over, adult: Secondary | ICD-10-CM | POA: Diagnosis not present

## 2022-10-09 DIAGNOSIS — E785 Hyperlipidemia, unspecified: Secondary | ICD-10-CM | POA: Diagnosis not present

## 2022-10-09 DIAGNOSIS — I10 Essential (primary) hypertension: Secondary | ICD-10-CM | POA: Diagnosis not present

## 2022-10-09 DIAGNOSIS — E8881 Metabolic syndrome: Secondary | ICD-10-CM | POA: Diagnosis not present

## 2022-10-09 DIAGNOSIS — K760 Fatty (change of) liver, not elsewhere classified: Secondary | ICD-10-CM | POA: Diagnosis not present

## 2022-10-09 DIAGNOSIS — E119 Type 2 diabetes mellitus without complications: Secondary | ICD-10-CM | POA: Diagnosis not present

## 2022-10-09 DIAGNOSIS — G4730 Sleep apnea, unspecified: Secondary | ICD-10-CM | POA: Diagnosis not present

## 2022-10-13 ENCOUNTER — Encounter: Payer: Self-pay | Admitting: Nurse Practitioner

## 2022-10-13 ENCOUNTER — Other Ambulatory Visit: Payer: Self-pay | Admitting: Nurse Practitioner

## 2022-10-13 MED ORDER — SEMAGLUTIDE (1 MG/DOSE) 4 MG/3ML ~~LOC~~ SOPN: 1.0000 mg | PEN_INJECTOR | SUBCUTANEOUS | 1 refills | Status: DC

## 2022-10-20 ENCOUNTER — Encounter: Payer: Self-pay | Admitting: *Deleted

## 2022-10-20 ENCOUNTER — Other Ambulatory Visit: Payer: Medicaid Other | Admitting: *Deleted

## 2022-10-20 NOTE — Patient Outreach (Signed)
Care Management/Care Coordination  RN Case Manager Case Closure Note  10/20/2022 Name: Tonya Wilkerson MRN: [id number] DOB: 1970-06-26  Tonya Wilkerson is a 52 y.o. year old female who is a primary care patient of Tonya Rigg, NP. The care management/care coordination team was consulted for assistance with chronic disease management and/or care coordination needs.   Care Plan : RN Care Manager Plan of Care  Updates made by Heidi Dach, RN since 10/20/2022 12:00 AM  Completed 10/20/2022   Problem: Health Management needs related to HTN Resolved 10/20/2022     Long-Range Goal: Development of Plan of Care to address Health Management needs related to HTN Completed 10/20/2022  Start Date: 03/11/2022  Expected End Date: 10/24/2022  Priority: High  Note:   Current Barriers:  Chronic Disease Management support and education needs related to HTN-Ms. Mowers reports BP improved. She continues to work with [facility] for weight loss plan of care.  RNCM Clinical Goal(s):  Patient will verbalize understanding of plan for management of HTN as evidenced by patient reports take all medications exactly as prescribed and will call provider for medication related questions as evidenced by patient reports and EMR documentation    attend all scheduled medical appointments: [facility] 08/15/22, Colonoscopy 09/02/22, Breast Lumpectomy 09/05/22 as evidenced by provider documentation          Interventions: Inter-disciplinary care team collaboration (see longitudinal plan of care) Evaluation of current treatment plan related to  self management and patient's adherence to plan as established by provider Discussed Case Closure, patient will contact RNCM with new needs/barriers to managing her health   Weight Loss Interventions:  (Status:  Goal Met.) Long Term Goal Patient working with [facility] for weight loss Provided verbal and/or written education to patient re: provider recommended life  style modifications  Reviewed recommended dietary changes: avoid fad diets, make small/incremental dietary and exercise changes, eat at the table and avoid eating in front of the TV, plan management of cravings, monitor snacking and cravings in food diary Reviewed and discussed provider notes from Bariatric consult   Reviewed upcoming appointments: 10/24/22 with [facility], 10/24/22 with [facility], 10/27/22 with [facility] and 10/28/22 with B[facility] Clinic  Hypertension: (Status: Goal Met.) Long Term Goal  Last practice recorded BP readings: recent BP readings: 09/08/22 108/77, 09/16/22 122/84 and 10/09/22 131/84 BP Readings from Last 3 Encounters:  09/05/22 (!) 127/93  07/16/22 117/83  06/03/22 (!) 135/94   Most recent eGFR/CrCl:  Lab Results  Component Value Date   EGFR 70 07/16/2022    No components found for: "CRCL"  Provided education to patient re: stroke prevention, s/s of heart attack and stroke; Reviewed prescribed diet increasing vegetables and fruit Reviewed medications with patient and discussed importance of compliance;  Counseled on the importance of exercise goals with target of 150 minutes per week Discussed plans with patient for ongoing care management follow up and provided patient with direct contact information for care management team; Advised patient, providing education and rationale, to monitor blood pressure daily and record, calling PCP for findings outside established parameters;  Provided education on prescribed diet heart healthy;  Advised patient to check BP at home and record at least 3 times a week, take record to next PCP visit Provided education on managing HTN Discussed the benefits in stress reduction and relaxation  Patient Goals/Self-Care Activities: Take medications as prescribed   Attend all scheduled provider appointments Call provider office  for new concerns or questions  Check BP at home and record       Plan: The  patient has met all care management goals, agreed to case closure, and has been provided with contact information for the care management team. Appropriate care team members and provider have been notified via electronic communication. The care management team is available to at any time in the future should needs arise.   Tonya Emms RN, BSN [place]  Managed [facility] RN Care Coordinator [phone]

## 2022-10-25 DIAGNOSIS — G4733 Obstructive sleep apnea (adult) (pediatric): Secondary | ICD-10-CM | POA: Diagnosis not present

## 2022-10-29 DIAGNOSIS — Z6841 Body Mass Index (BMI) 40.0 and over, adult: Secondary | ICD-10-CM | POA: Diagnosis not present

## 2022-10-31 DIAGNOSIS — I10 Essential (primary) hypertension: Secondary | ICD-10-CM | POA: Diagnosis not present

## 2022-10-31 DIAGNOSIS — R5383 Other fatigue: Secondary | ICD-10-CM | POA: Diagnosis not present

## 2022-11-03 DIAGNOSIS — R0789 Other chest pain: Secondary | ICD-10-CM | POA: Diagnosis not present

## 2022-11-03 DIAGNOSIS — R000 Tachycardia, unspecified: Secondary | ICD-10-CM | POA: Diagnosis not present

## 2022-11-03 DIAGNOSIS — I10 Essential (primary) hypertension: Secondary | ICD-10-CM | POA: Diagnosis not present

## 2022-11-03 DIAGNOSIS — R5383 Other fatigue: Secondary | ICD-10-CM | POA: Diagnosis not present

## 2022-11-03 DIAGNOSIS — E7849 Other hyperlipidemia: Secondary | ICD-10-CM | POA: Diagnosis not present

## 2022-11-04 DIAGNOSIS — G4733 Obstructive sleep apnea (adult) (pediatric): Secondary | ICD-10-CM | POA: Diagnosis not present

## 2022-11-04 DIAGNOSIS — Z6841 Body Mass Index (BMI) 40.0 and over, adult: Secondary | ICD-10-CM | POA: Diagnosis not present

## 2022-11-07 ENCOUNTER — Other Ambulatory Visit: Payer: Self-pay | Admitting: Family Medicine

## 2022-11-07 ENCOUNTER — Other Ambulatory Visit: Payer: Self-pay | Admitting: Nurse Practitioner

## 2022-11-07 DIAGNOSIS — G4709 Other insomnia: Secondary | ICD-10-CM

## 2022-11-07 DIAGNOSIS — E119 Type 2 diabetes mellitus without complications: Secondary | ICD-10-CM

## 2022-11-11 DIAGNOSIS — G4730 Sleep apnea, unspecified: Secondary | ICD-10-CM | POA: Diagnosis not present

## 2022-11-11 DIAGNOSIS — E119 Type 2 diabetes mellitus without complications: Secondary | ICD-10-CM | POA: Diagnosis not present

## 2022-11-11 DIAGNOSIS — I10 Essential (primary) hypertension: Secondary | ICD-10-CM | POA: Diagnosis not present

## 2022-11-27 ENCOUNTER — Other Ambulatory Visit: Payer: Self-pay | Admitting: Nurse Practitioner

## 2022-11-27 DIAGNOSIS — Z87891 Personal history of nicotine dependence: Secondary | ICD-10-CM | POA: Diagnosis not present

## 2022-11-27 DIAGNOSIS — R7303 Prediabetes: Secondary | ICD-10-CM | POA: Diagnosis not present

## 2022-11-27 DIAGNOSIS — Z6841 Body Mass Index (BMI) 40.0 and over, adult: Secondary | ICD-10-CM | POA: Diagnosis not present

## 2022-11-27 DIAGNOSIS — N939 Abnormal uterine and vaginal bleeding, unspecified: Secondary | ICD-10-CM | POA: Diagnosis not present

## 2022-11-27 DIAGNOSIS — D75839 Thrombocytosis, unspecified: Secondary | ICD-10-CM | POA: Diagnosis not present

## 2022-11-27 DIAGNOSIS — J4522 Mild intermittent asthma with status asthmaticus: Secondary | ICD-10-CM | POA: Diagnosis not present

## 2022-11-27 DIAGNOSIS — G4733 Obstructive sleep apnea (adult) (pediatric): Secondary | ICD-10-CM | POA: Diagnosis not present

## 2022-11-27 DIAGNOSIS — Z01818 Encounter for other preprocedural examination: Secondary | ICD-10-CM | POA: Diagnosis not present

## 2022-11-27 DIAGNOSIS — K760 Fatty (change of) liver, not elsewhere classified: Secondary | ICD-10-CM | POA: Diagnosis not present

## 2022-11-27 DIAGNOSIS — J45909 Unspecified asthma, uncomplicated: Secondary | ICD-10-CM | POA: Diagnosis not present

## 2022-11-27 DIAGNOSIS — D649 Anemia, unspecified: Secondary | ICD-10-CM | POA: Diagnosis not present

## 2022-11-27 DIAGNOSIS — R000 Tachycardia, unspecified: Secondary | ICD-10-CM | POA: Diagnosis not present

## 2022-11-27 DIAGNOSIS — I10 Essential (primary) hypertension: Secondary | ICD-10-CM | POA: Diagnosis not present

## 2022-11-28 MED ORDER — ACCU-CHEK GUIDE W/DEVICE KIT: 1.0000 | PACK | Freq: Two times a day (BID) | 0 refills | Status: DC

## 2022-11-28 MED ORDER — PRAVASTATIN SODIUM 20 MG PO TABS: 20.0000 mg | ORAL_TABLET | Freq: Every day | ORAL | 0 refills | Status: DC

## 2022-11-29 DIAGNOSIS — M25512 Pain in left shoulder: Secondary | ICD-10-CM | POA: Diagnosis not present

## 2022-11-29 DIAGNOSIS — M62838 Other muscle spasm: Secondary | ICD-10-CM | POA: Diagnosis not present

## 2022-12-10 DIAGNOSIS — G4733 Obstructive sleep apnea (adult) (pediatric): Secondary | ICD-10-CM | POA: Diagnosis not present

## 2022-12-10 DIAGNOSIS — E119 Type 2 diabetes mellitus without complications: Secondary | ICD-10-CM | POA: Diagnosis not present

## 2022-12-10 DIAGNOSIS — I10 Essential (primary) hypertension: Secondary | ICD-10-CM | POA: Diagnosis not present

## 2022-12-10 DIAGNOSIS — G4730 Sleep apnea, unspecified: Secondary | ICD-10-CM | POA: Diagnosis not present

## 2022-12-10 DIAGNOSIS — Z6841 Body Mass Index (BMI) 40.0 and over, adult: Secondary | ICD-10-CM | POA: Diagnosis not present

## 2022-12-16 ENCOUNTER — Encounter: Payer: Self-pay | Admitting: Nurse Practitioner

## 2022-12-17 NOTE — Telephone Encounter (Signed)
Patient stated that surgeons office gave her medication to help treat her symptoms.

## 2022-12-22 DIAGNOSIS — G4733 Obstructive sleep apnea (adult) (pediatric): Secondary | ICD-10-CM | POA: Diagnosis not present

## 2022-12-23 ENCOUNTER — Other Ambulatory Visit: Payer: Self-pay | Admitting: Nurse Practitioner

## 2023-01-05 DIAGNOSIS — G4710 Hypersomnia, unspecified: Secondary | ICD-10-CM | POA: Diagnosis not present

## 2023-01-05 DIAGNOSIS — G4733 Obstructive sleep apnea (adult) (pediatric): Secondary | ICD-10-CM | POA: Diagnosis not present

## 2023-01-14 ENCOUNTER — Other Ambulatory Visit: Payer: Self-pay | Admitting: Nurse Practitioner

## 2023-01-14 NOTE — Telephone Encounter (Signed)
Medication Refill - Medication: pravastatin (PRAVACHOL) 20 MG tablet [[id number]]  Has the patient contacted their pharmacy?  yes ( Preferred Pharmacy (with phone number or street name):  [id number] - [place], [place] - [address] AT [facility] OF [address]  [address], [place] [address]  Phone:  [phone]  Fax:  [phone]  DEA #:  [id number] DAW Reason: -- Has the patient been seen for an appointment in the last year OR does the patient have an upcoming appointment? Yes.    Agent: Please be advised that RX refills may take up to 3 business days. We ask that you follow-up with your pharmacy.

## 2023-01-15 MED ORDER — PRAVASTATIN SODIUM 20 MG PO TABS: 20.0000 mg | ORAL_TABLET | Freq: Every day | ORAL | 0 refills | Status: DC

## 2023-01-15 NOTE — Telephone Encounter (Signed)
Requested medication (s) are due for refill today: yes  Requested medication (s) are on the active medication list: yes  Last refill:  12/24/22  Future visit scheduled: no  Notes to clinic:  Unable to refill per protocol, courtesy refill already given, routing for provider approval.      Requested Prescriptions  Pending Prescriptions Disp Refills   pravastatin (PRAVACHOL) 20 MG tablet 30 tablet 0    Sig: Take 1 tablet (20 mg total) by mouth daily. Please make a PCP appt with Bertram [facility] for more refills.     Cardiovascular:  Antilipid - Statins Failed - 01/14/2023  1:25 PM      Failed - Lipid Panel in normal range within the last 12 months    Cholesterol, Total  Date Value Ref Range Status  05/16/2022 237 (H) 100 - 199 mg/dL Final   LDL Chol Calc (NIH)  Date Value Ref Range Status  05/16/2022 97 0 - 99 mg/dL Final   HDL  Date Value Ref Range Status  05/16/2022 125 >39 mg/dL Final   Triglycerides  Date Value Ref Range Status  05/16/2022 88 0 - 149 mg/dL Final         Passed - Patient is not pregnant      Passed - Valid encounter within last 12 months    Recent Outpatient Visits           5 months ago Primary hypertension   [place] [facility] [place], Shea Stakes, NP   6 months ago Primary hypertension   [place] [facility] [place], Shea Stakes, NP   8 months ago Encounter for annual physical exam   [facility] [place], Shea Stakes, NP   1 year ago Essential hypertension   [place] [facility] [place], Shea Stakes, NP   1 year ago Bacterial sinusitis   [place] [facility] [place], Shea Stakes, NP       Future Appointments             In 2 weeks Sharon Seller, Marzella Schlein, PA-C [facility]   In 4 months Claiborne Rigg, NP [facility]

## 2023-01-17 ENCOUNTER — Other Ambulatory Visit: Payer: Self-pay | Admitting: Family Medicine

## 2023-01-19 DIAGNOSIS — G4733 Obstructive sleep apnea (adult) (pediatric): Secondary | ICD-10-CM | POA: Diagnosis not present

## 2023-01-19 NOTE — Telephone Encounter (Signed)
Courtesy RF- no increase until seen per notes Requested Prescriptions  Pending Prescriptions Disp Refills   pravastatin (PRAVACHOL) 20 MG tablet [Pharmacy Med Name: PRAVASTATIN 20MG  TABLETS] 90 tablet     Sig: TAKE 1 TABLET BY MOUTH EVERY DAY     Cardiovascular:  Antilipid - Statins Failed - 01/17/2023 10:05 AM      Failed - Lipid Panel in normal range within the last 12 months    Cholesterol, Total  Date Value Ref Range Status  05/16/2022 237 (H) 100 - 199 mg/dL Final   LDL Chol Calc (NIH)  Date Value Ref Range Status  05/16/2022 97 0 - 99 mg/dL Final   HDL  Date Value Ref Range Status  05/16/2022 125 >39 mg/dL Final   Triglycerides  Date Value Ref Range Status  05/16/2022 88 0 - 149 mg/dL Final         Passed - Patient is not pregnant      Passed - Valid encounter within last 12 months    Recent Outpatient Visits           5 months ago Primary hypertension   [place] [facility] [place], Shea Stakes, NP   6 months ago Primary hypertension   [place] [facility] [place], Shea Stakes, NP   8 months ago Encounter for annual physical exam   [facility] [place], Shea Stakes, NP   1 year ago Essential hypertension   [place] [facility] [place], Shea Stakes, NP   1 year ago Bacterial sinusitis   [place] [facility] [place], Shea Stakes, NP       Future Appointments             In 1 week [place], Marzella Schlein, PA-C [facility]   In 4 months Claiborne Rigg, NP [facility]

## 2023-01-29 ENCOUNTER — Encounter: Payer: Self-pay | Admitting: Physician Assistant

## 2023-01-29 ENCOUNTER — Ambulatory Visit: Payer: Medicaid Other | Attending: Physician Assistant | Admitting: Physician Assistant

## 2023-01-29 VITALS — BP 160/100 | HR 81 | Ht 72.0 in | Wt 292.0 lb

## 2023-01-29 DIAGNOSIS — I10 Essential (primary) hypertension: Secondary | ICD-10-CM

## 2023-01-29 DIAGNOSIS — E1165 Type 2 diabetes mellitus with hyperglycemia: Secondary | ICD-10-CM

## 2023-01-29 DIAGNOSIS — E785 Hyperlipidemia, unspecified: Secondary | ICD-10-CM

## 2023-01-29 DIAGNOSIS — M533 Sacrococcygeal disorders, not elsewhere classified: Secondary | ICD-10-CM | POA: Diagnosis not present

## 2023-01-29 DIAGNOSIS — Z7984 Long term (current) use of oral hypoglycemic drugs: Secondary | ICD-10-CM

## 2023-01-29 LAB — POCT GLYCOSYLATED HEMOGLOBIN (HGB A1C): HbA1c, POC (controlled diabetic range): 5.6 % (ref 0.0–7.0)

## 2023-01-29 MED ORDER — DICLOFENAC SODIUM 1 % EX GEL
2.0000 g | Freq: Four times a day (QID) | CUTANEOUS | 1 refills | Status: AC
Start: 2023-01-29 — End: ?

## 2023-01-29 MED ORDER — PRAVASTATIN SODIUM 20 MG PO TABS
20.0000 mg | ORAL_TABLET | Freq: Every day | ORAL | 1 refills | Status: DC
Start: 2023-01-29 — End: 2023-05-01

## 2023-01-29 MED ORDER — METHOCARBAMOL 500 MG PO TABS
1000.0000 mg | ORAL_TABLET | Freq: Three times a day (TID) | ORAL | 1 refills | Status: DC | PRN
Start: 2023-01-29 — End: 2023-06-03

## 2023-01-29 MED ORDER — AMLODIPINE BESYLATE 10 MG PO TABS
5.0000 mg | ORAL_TABLET | Freq: Every day | ORAL | 1 refills | Status: DC
Start: 2023-01-29 — End: 2023-05-01

## 2023-01-29 NOTE — Progress Notes (Signed)
Patient ID: Tonya Wilkerson, female   DOB: 23-Apr-1970, 52 y.o.   MRN: [id number]   Rushika Haberl, is a 52 y.o. female  [id number]  [id number]  DOB - 1970-09-11  Chief Complaint  Patient presents with   Back Pain    Leg pain        Subjective:   Tonya Wilkerson is a 52 y.o. female here today for a BP check and lower back pain in the middle that is radiating into the L leg.  This has been going on since the Spring but she has not seen anyone for it.  No urinary s/sx.  No weakness or limp.  NKI.    Gastric sleeve in 11/2022-she is doing great since that and has lost >40 pounds.  She was previously on combination amlodipine/valsartan/HCT.  When she resumed it at half dose it still dropped her BP too low.  So, she has not been taking anything and has not been checking her blood pressure at home but she has a cuff.  Not taking anything for diabetes now since surgery.  Still taking pravastatin.    No problems updated.  ALLERGIES: No Known Allergies  PAST MEDICAL HISTORY: Past Medical History:  Diagnosis Date   Abnormal uterine bleeding (AUB)    Anemia    Anxiety    Asthma    COVID 03/2020   mild cold symptoms x 3 days all symptoms resolved   Diabetes mellitus without complication (HCC)    Dyspnea    with exertion   History of blood transfusion yrs ago   after childbirth   History of pre-eclampsia 17 yrs ago   Hypertension    Morbid obesity with BMI of 40.0-44.9, adult (HCC)    Pneumonia due to infectious organism 2020   Sleep apnea dx 2021   did not tolerate cpap mild osa    Wears glasses     MEDICATIONS AT HOME: Prior to Admission medications   Medication Sig Start Date End Date Taking? Authorizing Provider  albuterol (VENTOLIN HFA) 108 (90 Base) MCG/ACT inhaler Inhale 1-2 puffs into the lungs every 4 (four) hours as needed for wheezing or shortness of breath. 03/11/22  Yes Claiborne Rigg, NP  amLODipine (NORVASC) 10 MG tablet Take 0.5 tablets (5 mg total) by mouth  daily. 01/29/23  Yes Hennesy Sobalvarro M, PA-C  Blood Glucose Monitoring Suppl (ACCU-CHEK GUIDE) w/Device KIT 1 each by Does not apply route 2 (two) times daily. 11/28/22  Yes Claiborne Rigg, NP  Blood Pressure Monitor DEVI Please provide patient with insurance approved blood pressure monitor 05/16/22  Yes Claiborne Rigg, NP  busPIRone (BUSPAR) 15 MG tablet TAKE 1 TABLET(15 MG) BY MOUTH THREE TIMES DAILY 08/28/21  Yes Hoy Register, MD  cetirizine (ZYRTEC) 10 MG tablet Take 1 tablet (10 mg total) by mouth daily. 05/16/22  Yes Claiborne Rigg, NP  diclofenac Sodium (VOLTAREN ARTHRITIS PAIN) 1 % GEL Apply 2 g topically 4 (four) times daily. 01/29/23  Yes Georgian Co M, PA-C  glucose blood (ACCU-CHEK GUIDE) test strip Use as instructed. Inject into the skin twice daily E11.65 05/07/19  Yes Claiborne Rigg, NP  ipratropium-albuterol (DUONEB) 0.5-2.5 (3) MG/3ML SOLN Take 3 mLs by nebulization every 4 (four) hours as needed. 03/11/22  Yes Claiborne Rigg, NP  methocarbamol (ROBAXIN) 500 MG tablet Take 2 tablets (1,000 mg total) by mouth every 8 (eight) hours as needed. For muscle spasm 01/29/23  Yes Anders Simmonds, PA-C  omeprazole (PRILOSEC) 40 MG capsule  Take 40 mg by mouth daily. 11/26/22  Yes [provider]  Semaglutide, 1 MG/DOSE, 4 MG/3ML SOPN Inject 1 mg as directed once a week. 10/13/22  Yes Claiborne Rigg, NP  traZODone (DESYREL) 50 MG tablet TAKE 1/2 TO 1 TABLET BY MOUTH EVERY NIGHT AT BEDTIME AS NEEDED 11/07/22  Yes Hoy Register, MD  ursodiol (ACTIGALL) 300 MG capsule Take 300 mg by mouth daily. 11/26/22 05/25/23 Yes [provider]  fluticasone (FLONASE) 50 MCG/ACT nasal spray Place 2 sprays into both nostrils daily. Patient not taking: Reported on 01/29/2023 05/16/22   Claiborne Rigg, NP  ibuprofen (ADVIL) 600 MG tablet Take 1 tablet (600 mg total) by mouth every 6 (six) hours. Patient not taking: Reported on 01/29/2023 07/18/20   [name], MD   mometasone-formoterol [facility]) 100-5 MCG/ACT AERO Inhale 2 puffs into the lungs 2 (two) times daily. Patient not taking: Reported on 01/29/2023 03/11/22   Claiborne Rigg, NP  pravastatin (PRAVACHOL) 20 MG tablet Take 1 tablet (20 mg total) by mouth daily. 01/29/23   Anabelen Kaminsky, Marzella Schlein, PA-C    ROS: Neg HEENT Neg resp Neg cardiac Neg GI Neg GU Neg psych Neg neuro  Objective:   Vitals:   01/29/23 1450 01/29/23 1506  BP: (!) 170/120 (!) 160/100  Pulse: 81   SpO2: 97%   Weight: 292 lb (132.5 kg)   Height: 6' (1.829 m)    Exam General appearance : Awake, alert, not in any distress. Speech Clear. Not toxic looking HEENT: Atraumatic and Normocephalic Neck: Supple, no JVD. No cervical lymphadenopathy.  Chest: Good air entry bilaterally, CTAB.  No rales/rhonchi/wheezing CVS: S1 S2 regular, no murmurs.  Back: there is tenderness in the L-S spine and in the L SI joint.  ROM ~80% normal.  Neg SLR B.  BLE DTR=intact  Extremities: B/L Lower Ext shows no edema, both legs are warm to touch Neurology: Awake alert, and oriented X 3, CN II-XII intact, Non focal Skin: No Rash  Data Review Lab Results  Component Value Date   HGBA1C 5.6 01/29/2023   HGBA1C 6.5 (H) 05/16/2022   HGBA1C 5.8 (H) 05/01/2021    Assessment & Plan   1. Sacroiliac joint dysfunction of left side Stretches, ice, exercises - DG Lumbar Spine Complete; Future - diclofenac Sodium (VOLTAREN ARTHRITIS PAIN) 1 % GEL; Apply 2 g topically 4 (four) times daily.  Dispense: 100 g; Refill: 1 - methocarbamol (ROBAXIN) 500 MG tablet; Take 2 tablets (1,000 mg total) by mouth every 8 (eight) hours as needed. For muscle spasm  Dispense: 90 tablet; Refill: 1  2. Type 2 diabetes mellitus with hyperglycemia, without long-term current use of insulin (HCC) Diet controlled since weight loss and gastric sleeve - HgB A1c  3. Essential hypertension Check your blood pressure daily or every other day.  Sit still and quiet with deep  breathing for 5 mins before your check it.  Start amlodipine 5 mg.  The goal blood pressure is <130/<85.  If you are not at that goal on average after 4 weeks, increase the dose to 10mg . - amLODipine (NORVASC) 10 MG tablet; Take 0.5 tablets (5 mg total) by mouth daily.  Dispense: 45 tablet; Refill: 1  4. Dyslipidemia, goal LDL below 70 - pravastatin (PRAVACHOL) 20 MG tablet; Take 1 tablet (20 mg total) by mouth daily.  Dispense: 90 tablet; Refill: 1    Return in about 3 months (around 05/01/2023) for PCP for chronic conditions-[place].  The patient was given clear instructions to  go to ER or return to medical center if symptoms don't improve, worsen or new problems develop. The patient verbalized understanding. The patient was told to call to get lab results if they haven't heard anything in the next week.      Georgian Co, PA-C [facility] [place], [place] [phone]   01/29/2023, 3:14 PM

## 2023-01-29 NOTE — Patient Instructions (Addendum)
Check your blood pressure daily or every other day.  Sit still and quiet with deep breathing for 5 mins before your check it.  Start amlodipine 5 mg.  The goal blood pressure is <130/<85.  If you are not at that goal on average after 4 weeks, increase the dose to 10mg .  Your A1c today is perfect at 5.6.  keep up the good work!!!!   Sacroiliac Joint Dysfunction  Sacroiliac joint dysfunction is a condition that causes inflammation on one or both sides of the sacroiliac (SI) joint. The SI joint is the joint between two bones of the pelvis called the sacrum and the ilium. The sacrum is the bone at the base of the spine. The ilium is the large bone that forms the hip. This condition causes deep aching or burning pain in the low back. In some cases, the pain may also spread into one or both buttocks, hips, or thighs. What are the causes? This condition may be caused by: Pregnancy. During pregnancy, extra stress is put on the SI joints because the pelvis widens. Injury, such as: Injuries from car crashes. Sports-related injuries. Work-related injuries. Having one leg that is shorter than the other. Conditions that affect the joints, such as: Rheumatoid arthritis. Gout. Psoriatic arthritis. Joint infection (septic arthritis). Sometimes, the cause of SI joint dysfunction is not known. What are the signs or symptoms? Symptoms of this condition include: Aching or burning pain in the lower back. The pain may also spread to other areas, such as: Buttocks. Groin. Thighs. Muscle spasms in or around the painful areas. Increased pain when standing, walking, running, stair climbing, bending, or lifting. How is this diagnosed? This condition is diagnosed with a physical exam and your medical history. During the exam, the health care provider may move one or both of your legs to different positions to check for pain. Various tests may be done to confirm the diagnosis, including: Imaging tests to look for  other causes of pain. These may include: MRI. CT scan. Bone scan. Diagnostic injection. A numbing medicine is injected into the SI joint using a needle. If your pain is temporarily improved or stopped after the injection, this can indicate that SI joint dysfunction is the problem. How is this treated? Treatment depends on the cause and severity of your condition. Treatment options can be noninvasive and may include: Ice or heat applied to the lower back area after an injury. This may help reduce pain and muscle spasms. Medicines to relieve pain or inflammation or to relax the muscles. Wearing a back brace (sacroiliac brace) to help support the joint while your back is healing. Physical therapy to increase muscle strength around the joint and flexibility at the joint. This may also involve learning proper body positions and ways of moving to relieve stress on the joint. Direct manipulation of the SI joint. Use of a device that provides electrical stimulation to help reduce pain at the joint. Other treatments may include: Injections of steroid medicine into the joint to reduce pain and swelling. Radiofrequency ablation. This treatment uses heat to burn away nerves that are carrying pain messages from the joint. Surgery to put in screws and plates that limit or prevent joint motion. This is rare. Follow these instructions at home: Medicines Take over-the-counter and prescription medicines only as told by your health care provider. Ask your health care provider if the medicine prescribed to you: Requires you to avoid driving or using machinery. Can cause constipation. You may need to  take these actions to prevent or treat constipation: Drink enough fluid to keep your urine pale yellow. Take over-the-counter or prescription medicines. Eat foods that are high in fiber, such as beans, whole grains, and fresh fruits and vegetables. Limit foods that are high in fat and processed sugars, such as  fried or sweet foods. If you have a brace: Wear the brace as told by your health care provider. Remove it only as told by your health care provider. Keep the brace clean. If the brace is not waterproof: Do not let it get wet. Cover it with a watertight covering when you take a bath or a shower. Managing pain, stiffness, and swelling     Icing can help with pain and swelling. Heat may help with muscle tension or spasms. Ask your health care provider if you should use ice or heat. If directed, put ice on the affected area: If you have a removable brace, remove it as told by your health care provider. Put ice in a plastic bag. Place a towel between your skin and the bag. Leave the ice on for 20 minutes, 2-3 times a day. Remove the ice if your skin turns bright red. This is very important. If you cannot feel pain, heat, or cold, you have a greater risk of damage to the area. If directed, apply heat to the affected area as often as told by your health care provider. Use the heat source that your health care provider recommends, such as a moist heat pack or a heating pad. Place a towel between your skin and the heat source. Leave the heat on for 20-30 minutes. Remove the heat if your skin turns bright red. This is especially important if you are unable to feel pain, heat, or cold. You may have a greater risk of getting burned. General instructions Rest as needed. Return to your normal activities as told by your health care provider. Ask your health care provider what activities are safe for you. Do exercises as told by your health care provider or physical therapist. Keep all follow-up visits. This is important. Contact a health care provider if: Your pain is not controlled with medicine. You have a fever. Your pain is getting worse. Get help right away if: You have weakness, numbness, or tingling in your legs or feet. You lose control of your bladder or bowels. Summary Sacroiliac (SI)  joint dysfunction is a condition that causes inflammation on one or both sides of the SI joint. This condition causes deep aching or burning pain in the low back. In some cases, the pain may also spread into one or both buttocks, hips, or thighs. Treatment depends on the cause and severity of your condition. It may include medicines to reduce pain and swelling or to relax muscles. This information is not intended to replace advice given to you by your health care provider. Make sure you discuss any questions you have with your health care provider. Document Revised: 07/14/2019 Document Reviewed: 07/14/2019 Elsevier Patient Education  2024 [employer].

## 2023-01-30 ENCOUNTER — Telehealth: Payer: Self-pay | Admitting: Nurse Practitioner

## 2023-01-30 NOTE — Telephone Encounter (Signed)
Pt is calling to request streching information as discussed in OV with Marylene Land. Pt is fine with this information being uploaded into her MyChart

## 2023-02-01 ENCOUNTER — Other Ambulatory Visit: Payer: Self-pay | Admitting: Nurse Practitioner

## 2023-02-01 DIAGNOSIS — G4709 Other insomnia: Secondary | ICD-10-CM

## 2023-02-01 DIAGNOSIS — E119 Type 2 diabetes mellitus without complications: Secondary | ICD-10-CM

## 2023-02-02 NOTE — Telephone Encounter (Signed)
Requested by interface surescripts. Discontinued medication 01/29/23 by Cena Benton, PA.  Requested Prescriptions  Pending Prescriptions Disp Refills   traZODone (DESYREL) 50 MG tablet [Pharmacy Med Name: TRAZODONE 50MG  TABLETS] 90 tablet 0    Sig: TAKE 1/2 TO 1 TABLET BY MOUTH EVERY NIGHT AT BEDTIME AS NEEDED     Psychiatry: Antidepressants - Serotonin Modulator Passed - 02/02/2023  3:46 PM      Passed - Valid encounter within last 6 months    Recent Outpatient Visits           4 days ago Type 2 diabetes mellitus with hyperglycemia, without long-term current use of insulin (HCC)   [place] [facility] [place]. [facility], Marylene Land M, [place]   6 months ago Primary hypertension    [facility] [place] - A Dept Of [place]. [facility] Claiborne Rigg, NP   6 months ago Primary hypertension   [place] [facility] [place] - A Dept Of [place]. [facility] Claiborne Rigg, NP   8 months ago Encounter for annual physical exam   [place] [facility] [place] - A Dept Of [place]. [facility] Claiborne Rigg, NP   1 year ago Essential hypertension    [facility] [place] - A Dept Of [place]. [facility] Claiborne Rigg, NP       Future Appointments             In 2 months Claiborne Rigg, NP [facility] - A Dept Of [place]. [facility]   In 3 months Claiborne Rigg, NP [facility] [place] - A Dept Of [place] Bridegroom. [facility]            Refused Prescriptions Disp Refills   metFORMIN (GLUCOPHAGE) 500 MG tablet [Pharmacy Med Name: METFORMIN 500MG  TABLETS] 90 tablet 0    Sig: TAKE 1 TABLET(500 MG) BY MOUTH DAILY WITH BREAKFAST     Endocrinology:  Diabetes - Biguanides Failed - 02/02/2023  3:46 PM      Failed - Cr in normal range and within 360 days    Creatinine, Ser  Date Value Ref Range Status  09/03/2022 1.07 (H) 0.44 -  1.00 mg/dL Final         Failed - B12 Level in normal range and within 720 days    No results found for: "VITAMINB12"       Failed - CBC within normal limits and completed in the last 12 months    WBC  Date Value Ref Range Status  05/01/2021 8.0 3.4 - 10.8 x10E3/uL Final  07/13/2020 7.4 4.0 - 10.5 K/uL Final   RBC  Date Value Ref Range Status  05/01/2021 5.94 (H) 3.77 - 5.28 x10E6/uL Final  07/13/2020 5.04 3.87 - 5.11 MIL/uL Final   Hemoglobin  Date Value Ref Range Status  05/01/2021 14.3 11.1 - 15.9 g/dL Final   Hematocrit  Date Value Ref Range Status  05/01/2021 44.5 34.0 - 46.6 % Final   MCHC  Date Value Ref Range Status  05/01/2021 32.1 31.5 - 35.7 g/dL Final  16/12/9602 54.0 (L) 30.0 - 36.0 g/dL Final   [facility]  Date Value Ref Range Status  05/01/2021 24.1 (L) 26.6 - 33.0 pg Final  07/13/2020 16.7 (L) 26.0 - 34.0 pg Final   MCV  Date Value Ref Range Status  05/01/2021 75 (L)  79 - 97 fL Final   No results found for: "PLTCOUNTKUC", "LABPLAT", "POCPLA" RDW  Date Value Ref Range Status  05/01/2021 20.7 (H) 11.7 - 15.4 % Final         Passed - HBA1C is between 0 and 7.9 and within 180 days    HbA1c, POC (controlled diabetic range)  Date Value Ref Range Status  01/29/2023 5.6 0.0 - 7.0 % Final         Passed - eGFR in normal range and within 360 days    GFR calc Af Amer  Date Value Ref Range Status  12/28/2019 90 >59 mL/min/1.73 Final    Comment:    **In accordance with recommendations from the NKF-ASN Task force,**   [facility] is in the process of updating its eGFR calculation to the   2021 CKD-EPI creatinine equation that estimates kidney function   without a race variable.    GFR, Estimated  Date Value Ref Range Status  09/03/2022 >60 >60 mL/min Final    Comment:    (NOTE) Calculated using the CKD-EPI Creatinine Equation (2021)    eGFR  Date Value Ref Range Status  07/16/2022 70 >59 mL/min/1.73 Final         Passed - Valid encounter within last  6 months    Recent Outpatient Visits           4 days ago Type 2 diabetes mellitus with hyperglycemia, without long-term current use of insulin (HCC)   [place] [facility] Of [place]. [facility], Marylene Land M, [place]   6 months ago Primary hypertension   [place] [facility] [place] - A Dept Of [place]. [facility] Claiborne Rigg, NP   6 months ago Primary hypertension   [place] [facility] [place] - A Dept Of [place]. [facility] Claiborne Rigg, NP   8 months ago Encounter for annual physical exam   [place] [facility] [place] - A Dept Of [place]. [facility] Claiborne Rigg, NP   1 year ago Essential hypertension   [place] [facility] [place] - A Dept Of [place]. [facility] Claiborne Rigg, NP       Future Appointments             In 2 months Claiborne Rigg, NP [facility] - A Dept Of [place]. [facility]   In 3 months Claiborne Rigg, NP [facility] - A Dept Of [place] Bridegroom. [facility]

## 2023-02-02 NOTE — Telephone Encounter (Signed)
Requested medication (s) are due for refill today: yes   Requested medication (s) are on the active medication list: yes   Last refill:  11/07/22 #90 0 refills   Future visit scheduled: yes in 2 months.  Notes to clinic:   no refills remain. Do you want to refill Rx?     Requested Prescriptions  Pending Prescriptions Disp Refills   traZODone (DESYREL) 50 MG tablet [Pharmacy Med Name: TRAZODONE 50MG  TABLETS] 90 tablet 0    Sig: TAKE 1/2 TO 1 TABLET BY MOUTH EVERY NIGHT AT BEDTIME AS NEEDED     Psychiatry: Antidepressants - Serotonin Modulator Passed - 02/02/2023  3:46 PM      Passed - Valid encounter within last 6 months    Recent Outpatient Visits           4 days ago Type 2 diabetes mellitus with hyperglycemia, without long-term current use of insulin (HCC)   [place] [facility] Dept Of [place]. [facility], Marylene Land M, [place]   6 months ago Primary hypertension   [place] [facility] [place] - A Dept Of [place]. [facility] Claiborne Rigg, NP   6 months ago Primary hypertension   [place] [facility] [place] - A Dept Of [place]. [facility] Claiborne Rigg, NP   8 months ago Encounter for annual physical exam   [place] [facility] [place] - A Dept Of [place]. [facility] Claiborne Rigg, NP   1 year ago Essential hypertension   [place] [facility] [place] - A Dept Of [place]. [facility] Claiborne Rigg, NP       Future Appointments             In 2 months Claiborne Rigg, NP [facility] - A Dept Of [place]. [facility]   In 3 months Claiborne Rigg, NP [facility] - A Dept Of Eligha Bridegroom. [facility]            Refused Prescriptions Disp Refills   metFORMIN (GLUCOPHAGE) 500 MG tablet [Pharmacy Med Name: METFORMIN 500MG  TABLETS] 90 tablet 0    Sig: TAKE 1 TABLET(500 MG) BY MOUTH DAILY WITH BREAKFAST      Endocrinology:  Diabetes - Biguanides Failed - 02/02/2023  3:46 PM      Failed - Cr in normal range and within 360 days    Creatinine, Ser  Date Value Ref Range Status  09/03/2022 1.07 (H) 0.44 - 1.00 mg/dL Final         Failed - B12 Level in normal range and within 720 days    No results found for: "VITAMINB12"       Failed - CBC within normal limits and completed in the last 12 months    WBC  Date Value Ref Range Status  05/01/2021 8.0 3.4 - 10.8 x10E3/uL Final  07/13/2020 7.4 4.0 - 10.5 K/uL Final   RBC  Date Value Ref Range Status  05/01/2021 5.94 (H) 3.77 - 5.28 x10E6/uL Final  07/13/2020 5.04 3.87 - 5.11 MIL/uL Final   Hemoglobin  Date Value Ref Range Status  05/01/2021 14.3 11.1 - 15.9 g/dL Final   Hematocrit  Date Value Ref Range Status  05/01/2021 44.5 34.0 - 46.6 % Final   MCHC  Date Value Ref Range Status  05/01/2021 32.1 31.5 - 35.7 g/dL Final  25/95/6387 56.4 (  L) 30.0 - 36.0 g/dL Final   [facility]  Date Value Ref Range Status  05/01/2021 24.1 (L) 26.6 - 33.0 pg Final  07/13/2020 16.7 (L) 26.0 - 34.0 pg Final   MCV  Date Value Ref Range Status  05/01/2021 75 (L) 79 - 97 fL Final   No results found for: "PLTCOUNTKUC", "LABPLAT", "POCPLA" RDW  Date Value Ref Range Status  05/01/2021 20.7 (H) 11.7 - 15.4 % Final         Passed - HBA1C is between 0 and 7.9 and within 180 days    HbA1c, POC (controlled diabetic range)  Date Value Ref Range Status  01/29/2023 5.6 0.0 - 7.0 % Final         Passed - eGFR in normal range and within 360 days    GFR calc Af Amer  Date Value Ref Range Status  12/28/2019 90 >59 mL/min/1.73 Final    Comment:    **In accordance with recommendations from the NKF-ASN Task force,**   [facility] is in the process of updating its eGFR calculation to the   2021 CKD-EPI creatinine equation that estimates kidney function   without a race variable.    GFR, Estimated  Date Value Ref Range Status  09/03/2022 >60 >60 mL/min Final     Comment:    (NOTE) Calculated using the CKD-EPI Creatinine Equation (2021)    eGFR  Date Value Ref Range Status  07/16/2022 70 >59 mL/min/1.73 Final         Passed - Valid encounter within last 6 months    Recent Outpatient Visits           4 days ago Type 2 diabetes mellitus with hyperglycemia, without long-term current use of insulin (HCC)   [place] [facility] Dept Of [place]. [facility], Marylene Land M, [place]   6 months ago Primary hypertension   [place] [facility] [place] - A Dept Of [place]. [facility] Claiborne Rigg, NP   6 months ago Primary hypertension   [place] [facility] [place] - A Dept Of [place]. [facility] Claiborne Rigg, NP   8 months ago Encounter for annual physical exam   [facility] [place] - A Dept Of [place]. [facility] Claiborne Rigg, NP   1 year ago Essential hypertension   [place] [facility] [place] - A Dept Of [place]. [facility] Claiborne Rigg, NP       Future Appointments             In 2 months Claiborne Rigg, NP [facility] - A Dept Of . [facility]   In 3 months Claiborne Rigg, NP [facility] - A Dept Of Eligha Bridegroom. [facility]

## 2023-02-18 DIAGNOSIS — G4733 Obstructive sleep apnea (adult) (pediatric): Secondary | ICD-10-CM | POA: Diagnosis not present

## 2023-02-24 ENCOUNTER — Other Ambulatory Visit: Payer: Self-pay | Admitting: Nurse Practitioner

## 2023-02-24 ENCOUNTER — Ambulatory Visit: Payer: Self-pay | Admitting: *Deleted

## 2023-02-24 NOTE — Telephone Encounter (Signed)
  Chief Complaint: Severe itching under her left big toe that started yesterday afternoon. Symptoms: above Frequency: since yesterday afternoon Pertinent Negatives: Patient denies rash, redness, swelling, injuries, ingrown toenail. Disposition: [] ED /[] Urgent Care (no appt availability in office) / [] Appointment(In office/virtual)/ []  [place] [facility]/ [] Home Care/ [] Refused Recommended Disposition /[x] [place] [facility]/ []  Follow-up with PCP Additional Notes: No appts with [facility] until the end of Jan. 2025.   I suggested the [facility] which she was agreeable to going to it.    I gave her the location and hours for today and tomorrow at her request.

## 2023-02-24 NOTE — Telephone Encounter (Signed)
Message from [place] [facility] sent at 02/24/2023 11:46 AM EST  Summary: Severe Itching   Pt is calling in because she has been experiencing severe itching in her big toe on the left side. Pt said it's under the toe not on the top. Pt says this has been going on since yesterday afternoon at 4 PM. Pt says her A1C and her blood pressure are fine.          Call History  Contact Date/Time Type Contact Phone/Fax User  02/24/2023 11:44 AM EST Phone (Incoming) [place], [place] (Self) [phone] Rexene Edison) Colon Flattery M   Reason for Disposition  Pain in the big toe joint    Itching real bad  Answer Assessment - Initial Assessment Questions 1. ONSET: "When did the pain start?"      Itching on the bottom of my left toe.   No rash or redness.    2. LOCATION: "Where is the pain located?"   (e.g., around nail, entire toe, at foot joint)      Bottom of my left big toe.    Yesterday it started itching real bad.   3. PAIN: "How bad is the pain?"    (Scale 1-10; or mild, moderate, severe)   -  MILD (1-3): doesn't interfere with normal activities    -  MODERATE (4-7): interferes with normal activities (e.g., work or school) or awakens from sleep, limping    -  SEVERE (8-10): excruciating pain, unable to do any normal activities, unable to walk     No pain just itching real bad.   It kept waking me up last night.   4. APPEARANCE: "What does the toe look like?" (e.g., redness, swelling, bruising, pallor)     No swelling. 5. CAUSE: "What do you think is causing the toe pain?"     I don't know   6. OTHER SYMPTOMS: "Do you have any other symptoms?" (e.g., leg pain, rash, fever, numbness)     No ingrown nails 7. PREGNANCY: "Is there any chance you are pregnant?" "When was your last menstrual period?"     Not asked  Protocols used: Toe Pain-A-AH

## 2023-02-25 ENCOUNTER — Ambulatory Visit: Payer: Medicaid Other | Admitting: Physician Assistant

## 2023-02-25 ENCOUNTER — Encounter: Payer: Self-pay | Admitting: Physician Assistant

## 2023-02-25 VITALS — BP 165/98 | HR 80 | Ht 71.0 in | Wt 281.0 lb

## 2023-02-25 DIAGNOSIS — I10 Essential (primary) hypertension: Secondary | ICD-10-CM

## 2023-02-25 DIAGNOSIS — B353 Tinea pedis: Secondary | ICD-10-CM

## 2023-02-25 MED ORDER — KETOCONAZOLE 2 % EX CREA
1.0000 | TOPICAL_CREAM | Freq: Every day | CUTANEOUS | 0 refills | Status: AC
Start: 2023-02-25 — End: ?

## 2023-02-25 NOTE — Progress Notes (Signed)
Established Patient Office Visit  Subjective   Patient ID: Tonya Wilkerson, female    DOB: Jul 09, 1970  Age: 52 y.o. MRN: [id number]  Chief Complaint  Patient presents with   Itching Foot     States that she started having itching on her great left toe 2 days ago.  States that she has tried calamine lotion, alcohol, Epsom salts, over-the-counter athlete's foot spray without relief.  States that she does check her blood pressure at home, however has not checked it recently.  States that she was started back on her blood pressure medications again last month.  Denies hypertensive symptoms.        Past Medical History:  Diagnosis Date   Abnormal uterine bleeding (AUB)    Anemia    Anxiety    Asthma    COVID 03/2020   mild cold symptoms x 3 days all symptoms resolved   Diabetes mellitus without complication (HCC)    Dyspnea    with exertion   History of blood transfusion yrs ago   after childbirth   History of pre-eclampsia 17 yrs ago   Hypertension    Morbid obesity with BMI of 40.0-44.9, adult (HCC)    Pneumonia due to infectious organism 2020   Sleep apnea dx 2021   did not tolerate cpap mild osa    Wears glasses    Social History   Socioeconomic History   Marital status: Divorced    Spouse name: Not on file   Number of children: Not on file   Years of education: Not on file   Highest education level: Not on file  Occupational History   Not on file  Tobacco Use   Smoking status: Former    Types: Cigarettes   Smokeless tobacco: Never   Tobacco comments:    social smoker   Vaping Use   Vaping status: Never Used  Substance and Sexual Activity   Alcohol use: Yes    Alcohol/week: 14.0 standard drinks of alcohol    Types: 14 Standard drinks or equivalent per week   Drug use: No   Sexual activity: Not Currently    Birth control/protection: Surgical  Other Topics Concern   Not on file  Social History Narrative   Not on file   Social Determinants of Health    Financial Resource Strain: Low Risk  (08/13/2022)   Received from [facility]   Overall Financial Resource Strain (CARDIA)    Difficulty of Paying Living Expenses: Not very hard  Food Insecurity: No Food Insecurity (08/14/2022)   Received from [facility], [facility]   Hunger Vital Sign    Worried About Running Out of Food in the Last Year: Never true    Ran Out of Food in the Last Year: Never true  Transportation Needs: Unmet Transportation Needs (08/13/2022)   Received from [facility]   PRAPARE - Transportation    Lack of Transportation (Medical): Yes    Lack of Transportation (Non-Medical): Yes  Physical Activity: Insufficiently Active (08/13/2022)   Received from [facility]   Exercise Vital Sign    Days of Exercise per Week: 2 days    Minutes of Exercise per Session: 10 min  Stress: No Stress Concern Present (08/13/2022)   Received from [facility], [facility] Occupational Health - Occupational Stress Questionnaire    Feeling of Stress : Only a little  Social Connections: Moderately Integrated (08/13/2022)   [facility] from [facility], [facility]   Social Network    How would you rate your social network (family, work, friends)?: Adequate participation with social networks  Intimate Partner Violence: Not At Risk (11/27/2022)   Received from [facility]   HITS    Over the last 12 months how often did your partner physically hurt you?: Never    Over the last 12 months how often did your partner insult you or talk down to you?: Never    Over the last 12 months how often did your partner threaten you with physical harm?: Never    Over the last 12 months how often did your partner scream or curse at you?: Never   Family History  Problem Relation Age of Onset   Hypertension Mother    Stroke Mother    Diabetes Maternal Aunt    Cancer Maternal Grandfather    No Known Allergies  Review of  Systems  Constitutional: Negative.   HENT: Negative.    Eyes: Negative.   Respiratory:  Negative for shortness of breath.   Cardiovascular:  Negative for chest pain.  Gastrointestinal: Negative.   Genitourinary: Negative.   Musculoskeletal: Negative.   Skin:  Positive for itching and rash.  Neurological: Negative.   Endo/Heme/Allergies: Negative.   Psychiatric/Behavioral: Negative.        Objective:     BP (!) 165/98 (BP Location: Left Arm, Patient Position: Sitting, Cuff Size: Large)   Pulse 80   Ht 5\' 11"  (1.803 m)   Wt 281 lb (127.5 kg)   LMP 06/15/2020   SpO2 97%   BMI 39.19 kg/m  BP Readings from Last 3 Encounters:  02/25/23 (!) 165/98  01/29/23 (!) 160/100  09/05/22 (!) 127/93   Wt Readings from Last 3 Encounters:  02/25/23 281 lb (127.5 kg)  01/29/23 292 lb (132.5 kg)  09/05/22 (!) 338 lb 3 oz (153.4 kg)    Physical Exam Vitals and nursing note reviewed.  Constitutional:      Appearance: Normal appearance.  HENT:     Head: Normocephalic and atraumatic.     Right Ear: External ear normal.     Left Ear: External ear normal.     Nose: Nose normal.     Mouth/Throat:     Mouth: Mucous membranes are moist.     Pharynx: Oropharynx is clear.  Eyes:     Extraocular Movements: Extraocular movements intact.     Conjunctiva/sclera: Conjunctivae normal.     Pupils: Pupils are equal, round, and reactive to light.  Cardiovascular:     Rate and Rhythm: Normal rate and regular rhythm.     Pulses: Normal pulses.     Heart sounds: Normal heart sounds.  Pulmonary:     Effort: Pulmonary effort is normal.     Breath sounds: Normal breath sounds.  Musculoskeletal:        General: Normal range of motion.     Cervical back: Normal range of motion and neck supple.  Feet:     Left foot:     Toenail Condition: Left toenails are abnormally thick. Fungal disease present.    Comments: Slightly rough patches of discolored skin noted on all toes, thickened darkened nails  noted  Skin:    General: Skin is warm and dry.  Neurological:     General: No focal deficit present.     Mental Status: She is alert and oriented to person, place, and time.  Psychiatric:        Mood and Affect:  Mood normal.        Behavior: Behavior normal.        Thought Content: Thought content normal.        Judgment: Judgment normal.         Assessment & Plan:   Problem List Items Addressed This Visit       Cardiovascular and Mediastinum   Essential hypertension   Other Visit Diagnoses     Tinea pedis of left foot    -  Primary   Relevant Medications   ketoconazole (NIZORAL) 2 % cream   Elevated blood pressure reading in office with diagnosis of hypertension          1. Tinea pedis of left foot Trial ketoconazole.  Patient education given on supportive care. - ketoconazole (NIZORAL) 2 % cream; Apply 1 Application topically daily.  Dispense: 15 g; Refill: 0  2. Essential hypertension   3. Elevated blood pressure reading in office with diagnosis of hypertension Patient encouraged to check blood pressure at home, keep a written log and have available for all office visits.  Patient encouraged to return to mobile unit or follow-up with primary care provider if blood pressure readings remain elevated.  Red flags given for prompt reevaluation.   I have reviewed the patient's medical history (PMH, PSH, Social History, Family History, Medications, and allergies) , and have been updated if relevant. I spent 30 minutes reviewing chart and  face to face time with patient.     Return if symptoms worsen or fail to improve.    Kasandra Knudsen Mayers, PA-C

## 2023-02-25 NOTE — Patient Instructions (Addendum)
Use the ketoconazole cream once a daily , keep area clean and dry.  Your blood pressure is elevated today, I encourage you to check your blood pressure on a daily basis, keep a written log and have available for all office visits.  If your blood pressure remains elevated, please feel free to return to the mobile unit or follow-up with your primary care provider.   Roney Jaffe, PA-C Physician Assistant [facility] Medicine [email or web address]  Athlete's Foot Athlete's foot (tinea pedis) is a fungal infection of the skin on your feet. It often occurs on the skin that is between or underneath the toes. It can also occur on the soles of your feet. The infection can spread from person to person (is contagious). It can also spread when a person's bare feet come in contact with the fungus on shower floors or on items such as shoes. What are the causes? This condition is caused by a fungus that grows in warm, moist places. You can get athlete's foot by sharing shoes, shower stalls, towels, and wet floors with someone who is infected. Not washing your feet or changing your socks often enough can also lead to athlete's foot. What increases the risk? This condition is more likely to develop in: Men. People who have a weak body defense system (immune system). People who have diabetes. People who use public showers, such as at a gym. People who wear heavy-duty shoes, such as Youth worker. Seasons with warm, humid weather. What are the signs or symptoms? Symptoms of this condition include: Itchy areas between your toes or on the soles of your feet. White, flaky, or scaly areas between your toes or on the soles of your feet. Very itchy small blisters between your toes or on the soles of your feet. Small cuts in your skin. These cuts can become infected. Thick or discolored toenails. How is this diagnosed? This condition may be diagnosed  with a physical exam and a review of your medical history. Your health care provider may also take a skin or toenail sample to examine under a microscope. How is this treated? This condition is treated with antifungal medicines. These may be applied as powders, ointments, or creams. In severe cases, an oral antifungal medicine may be given. Follow these instructions at home: Medicines Apply or take over-the-counter and prescription medicines only as told by your health care provider. Apply your antifungal medicine as told by your health care provider. Do not stop using the antifungal even if your condition improves. Foot care Do not scratch your feet. Keep your feet dry: Wear cotton or wool socks. Change your socks every day or if they become wet. Wear shoes that allow air to flow, such as sandals or canvas tennis shoes. Wash and dry your feet, including the area between your toes. Also, wash and dry your feet: Every day or as told by your health care provider. After exercising. General instructions Do not let others use towels, shoes, nail clippers, or other personal items that touch your feet. Protect your feet by wearing sandals in wet areas, such as locker rooms and shared showers. Keep all follow-up visits. This is important. If you have diabetes, keep your blood sugar under control. Contact a health care provider if: You have a fever. You have swelling, soreness, warmth, or redness in your foot. Your feet are not getting better with treatment. Your symptoms get worse. You have new symptoms. You have severe pain. Summary Athlete's foot (tinea  pedis) is a fungal infection of the skin on your feet. It often occurs on skin that is between or underneath the toes. This condition is caused by a fungus that grows in warm, moist places. Symptoms include white, flaky, or scaly areas between your toes or on the soles of your feet. This condition is treated with antifungal medicines. Keep  your feet clean. Always dry them thoroughly. This information is not intended to replace advice given to you by your health care provider. Make sure you discuss any questions you have with your health care provider. Document Revised: 06/24/2020 Document Reviewed: 06/24/2020 Elsevier Patient Education  2024 [employer].

## 2023-03-05 ENCOUNTER — Encounter: Payer: Self-pay | Admitting: Podiatry

## 2023-03-05 ENCOUNTER — Ambulatory Visit: Payer: Medicaid Other | Admitting: Podiatry

## 2023-03-05 DIAGNOSIS — E119 Type 2 diabetes mellitus without complications: Secondary | ICD-10-CM | POA: Diagnosis not present

## 2023-03-05 DIAGNOSIS — B353 Tinea pedis: Secondary | ICD-10-CM | POA: Diagnosis not present

## 2023-03-05 MED ORDER — KETOCONAZOLE 2 % EX CREA: 1.0000 | TOPICAL_CREAM | Freq: Every day | CUTANEOUS | 2 refills | Status: AC

## 2023-03-05 NOTE — Progress Notes (Signed)
  Subjective:  Patient ID: Tonya Wilkerson, female    DOB: May 10, 1970,   MRN: [id number]  Chief Complaint  Patient presents with   Toe Pain    Pt presents for itching on her great toe on the left that started about two weeks ago    52 y.o. female presents for concern as above. Relates she was seen in mobile unit and given some ketoconazole. It did end up easing up prior to this but has been using the cream. Has been doing better and no issues currently. She does have a history of sciatica. Patient is diabetic and last A1c was  Lab Results  Component Value Date   HGBA1C 5.6 01/29/2023   .   PCP:  Claiborne Rigg, NP    . Denies any other pedal complaints. Denies n/v/f/c.   Past Medical History:  Diagnosis Date   Abnormal uterine bleeding (AUB)    Anemia    Anxiety    Asthma    COVID 03/2020   mild cold symptoms x 3 days all symptoms resolved   Diabetes mellitus without complication (HCC)    Dyspnea    with exertion   History of blood transfusion yrs ago   after childbirth   History of pre-eclampsia 17 yrs ago   Hypertension    Morbid obesity with BMI of 40.0-44.9, adult (HCC)    Pneumonia due to infectious organism 2020   Sleep apnea dx 2021   did not tolerate cpap mild osa    Wears glasses     Objective:  Physical Exam: Vascular: DP/PT pulses 2/4 bilateral. CFT <3 seconds. Normal hair growth on digits. No edema.  Skin. No lacerations or abrasions bilateral feet. Some small erythemetous patches noted around dorsum of first MPJ and foot. No obvious scalling or irrtation noted.  Musculoskeletal: MMT 5/5 bilateral lower extremities in DF, PF, Inversion and Eversion. Deceased ROM in DF of ankle joint.  Neurological: Sensation intact to light touch.   Assessment:   1. Tinea pedis of left foot   2. Type 2 diabetes mellitus without complication, without long-term current use of insulin (HCC)      Plan:  Patient was evaluated and treated and all questions  answered. -Examined patient -Discussed treatment options for tinea pedis vs possible sciatica, neuropathy vs bug bite.  -Continue ketoconazole for another two weeks to be on the safe side. Prescription refilled.  -Patient to return as scheduled for DM foot exam.    Louann Sjogren, DPM

## 2023-03-18 ENCOUNTER — Other Ambulatory Visit: Payer: Self-pay | Admitting: Nurse Practitioner

## 2023-03-20 MED ORDER — ACCU-CHEK GUIDE W/DEVICE KIT: 1.0000 | PACK | Freq: Two times a day (BID) | 0 refills | Status: AC

## 2023-03-21 ENCOUNTER — Other Ambulatory Visit: Payer: Self-pay | Admitting: Nurse Practitioner

## 2023-03-21 DIAGNOSIS — Z7985 Long-term (current) use of injectable non-insulin antidiabetic drugs: Secondary | ICD-10-CM

## 2023-03-21 DIAGNOSIS — G4733 Obstructive sleep apnea (adult) (pediatric): Secondary | ICD-10-CM | POA: Diagnosis not present

## 2023-03-21 MED ORDER — GLUCOSE BLOOD VI STRP: ORAL_STRIP | 12 refills | Status: DC

## 2023-03-21 MED ORDER — ACCU-CHEK SOFTCLIX LANCETS MISC: 3 refills | Status: AC

## 2023-05-01 ENCOUNTER — Ambulatory Visit: Payer: Medicaid Other | Attending: Nurse Practitioner | Admitting: Nurse Practitioner

## 2023-05-01 ENCOUNTER — Encounter: Payer: Self-pay | Admitting: Nurse Practitioner

## 2023-05-01 VITALS — BP 126/85 | HR 60 | Resp 20 | Ht 71.0 in | Wt 266.6 lb

## 2023-05-01 DIAGNOSIS — E119 Type 2 diabetes mellitus without complications: Secondary | ICD-10-CM | POA: Diagnosis not present

## 2023-05-01 DIAGNOSIS — G4709 Other insomnia: Secondary | ICD-10-CM | POA: Diagnosis not present

## 2023-05-01 DIAGNOSIS — Z7985 Long-term (current) use of injectable non-insulin antidiabetic drugs: Secondary | ICD-10-CM | POA: Diagnosis not present

## 2023-05-01 DIAGNOSIS — J309 Allergic rhinitis, unspecified: Secondary | ICD-10-CM

## 2023-05-01 DIAGNOSIS — E785 Hyperlipidemia, unspecified: Secondary | ICD-10-CM | POA: Diagnosis not present

## 2023-05-01 DIAGNOSIS — I10 Essential (primary) hypertension: Secondary | ICD-10-CM | POA: Diagnosis not present

## 2023-05-01 MED ORDER — AMLODIPINE-VALSARTAN-HCTZ 10-160-25 MG PO TABS: 0.5000 | ORAL_TABLET | Freq: Every day | ORAL | 1 refills | Status: DC

## 2023-05-01 MED ORDER — PRAVASTATIN SODIUM 20 MG PO TABS: 20.0000 mg | ORAL_TABLET | Freq: Every day | ORAL | 1 refills | Status: DC

## 2023-05-01 MED ORDER — IPRATROPIUM BROMIDE 0.03 % NA SOLN: 2.0000 | Freq: Two times a day (BID) | NASAL | 12 refills | Status: AC

## 2023-05-01 MED ORDER — TRAZODONE HCL 50 MG PO TABS: ORAL_TABLET | ORAL | 0 refills | Status: AC

## 2023-05-01 MED ORDER — SEMAGLUTIDE (1 MG/DOSE) 4 MG/3ML ~~LOC~~ SOPN: 1.0000 mg | PEN_INJECTOR | SUBCUTANEOUS | 6 refills | Status: AC

## 2023-05-01 NOTE — Progress Notes (Signed)
Assessment & Plan:  Tonya Wilkerson was seen today for medical management of chronic issues.  Diagnoses and all orders for this visit:  Essential hypertension -     amLODIPine-Valsartan-HCTZ (EXFORGE HCT) 10-160-25 MG TABS; Take 0.5 tablets by mouth daily. Continue all antihypertensives as prescribed.  Reminded to bring in blood pressure log for follow  up appointment.  RECOMMENDATIONS: DASH/Mediterranean Diets are healthier choices for HTN.    Dyslipidemia, goal LDL below 70 -     pravastatin (PRAVACHOL) 20 MG tablet; Take 1 tablet (20 mg total) by mouth daily. INSTRUCTIONS: Work on a low fat, heart healthy diet and participate in regular aerobic exercise program by working out at least 150 minutes per week; 5 days a week-30 minutes per day. Avoid red meat/beef/steak,  fried foods. junk foods, sodas, sugary drinks, unhealthy snacking, alcohol and smoking.  Drink at least 80 oz of water per day and monitor your carbohydrate intake daily.    Other insomnia -     traZODone (DESYREL) 50 MG tablet; TAKE 1/2 TO 1 TABLET BY MOUTH EVERY NIGHT AT BEDTIME AS NEEDED  Allergic rhinitis, unspecified seasonality, unspecified trigger -     ipratropium (ATROVENT) 0.03 % nasal spray; Place 2 sprays into both nostrils every 12 (twelve) hours.  Diabetes mellitus treated with injections of non-insulin medication (HCC) -     Semaglutide, 1 MG/DOSE, 4 MG/3ML SOPN; Inject 1 mg as directed once a week. Continue blood sugar control as discussed in office today, low carbohydrate diet, and regular physical exercise as tolerated, 150 minutes per week (30 min each day, 5 days per week, or 50 min 3 days per week). Keep blood sugar logs with fasting goal of 90-130 mg/dl, post prandial (after you eat) less than 180.  For Hypoglycemia: BS <60 and Hyperglycemia BS >400; contact the clinic ASAP. Annual eye exams and foot exams are recommended.     Patient has been counseled on age-appropriate routine health concerns for  screening and prevention. These are reviewed and up-to-date. Referrals have been placed accordingly. Immunizations are up-to-date or declined.    Subjective:   Chief Complaint  Patient presents with   Medical Management of Chronic Issues    Tonya Wilkerson 53 y.o. 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 Doing well and down over 50lbs.      HTN Blood pressure is well controlled. She is taking half a tablet of amlodipine-valsartan-HCTZ 10-160-25 mg. Has blood pressure device for home monitoring as needed.  BP Readings from Last 3 Encounters:  05/01/23 126/85  02/25/23 (!) 165/98  01/29/23 (!) 160/100     DM 2 Diabetes well controlled. She continues to use ozempic Lab Results  Component Value Date   HGBA1C 5.6 01/29/2023      Review of Systems  Constitutional:  Negative for fever, malaise/fatigue and weight loss.  HENT:  Positive for congestion. Negative for nosebleeds.   Eyes: Negative.  Negative for blurred vision, double vision and photophobia.  Respiratory: Negative.  Negative for cough and shortness of breath.   Cardiovascular: Negative.  Negative for chest pain, palpitations and leg swelling.  Gastrointestinal: Negative.  Negative for heartburn, nausea and vomiting.  Musculoskeletal: Negative.  Negative for myalgias.  Neurological: Negative.  Negative for dizziness, focal weakness, seizures and headaches.  Psychiatric/Behavioral: Negative.  Negative for suicidal ideas.     Past Medical History:  Diagnosis Date   Abnormal uterine bleeding (AUB)    Anemia    Anxiety    Asthma    COVID 03/2020   mild cold symptoms x 3 days all symptoms resolved   Diabetes mellitus  without complication (HCC)    Dyspnea    with exertion   History of blood transfusion yrs ago   after childbirth   History of pre-eclampsia 17 yrs ago   Hypertension    Morbid obesity with BMI of 40.0-44.9, adult (HCC)    Pneumonia due to infectious organism 2020   Sleep apnea dx 2021   did not tolerate cpap mild osa    Wears glasses     Past Surgical History:  Procedure Laterality Date   BREAST BIOPSY Left 06/25/2022   MM LT BREAST BX W LOC DEV 1ST LESION IMAGE BX SPEC STEREO GUIDE 06/25/2022 [facility]   BREAST BIOPSY  09/04/2022   MM LT RADIOACTIVE SEED LOC MAMMO GUIDE 09/04/2022 [facility]   BREAST LUMPECTOMY WITH RADIOACTIVE SEED LOCALIZATION Left 09/05/2022   Procedure: LEFT BREAST LUMPECTOMY WITH RADIOACTIVE SEED LOCALIZATION;  Surgeon: Griselda Miner, MD;  Location: [facility];  Service: General;  Laterality: Left;   CYSTOSCOPY N/A 07/17/2020   Procedure: CYSTOSCOPY;  Surgeon: [name], MD;  Location: [facility] [place];  Service: Gynecology;  Laterality: N/A;   ROBOTIC ASSISTED LAPAROSCOPIC HYSTERECTOMY AND SALPINGECTOMY Bilateral 07/17/2020   Procedure: XI ROBOTIC ASSISTED TOTAL HYSTERECTOMY WITH BILATERAL SALPINGECTOMY;  Surgeon: [name], MD;  Location: [facility];  Service: Gynecology;  Laterality: Bilateral;   TUBAL LIGATION  17 yrs ago    Family History  Problem Relation Age of Onset   Hypertension Mother    Stroke Mother    Diabetes Maternal Aunt    Cancer Maternal Grandfather     Social History Reviewed with no changes to be made today.   Outpatient Medications Prior to Visit  Medication Sig Dispense Refill   Accu-Chek Softclix Lancets lancets Use as instructed. Inject into the skin twice daily 100 each 3   albuterol (VENTOLIN HFA) 108 (90 Base) MCG/ACT inhaler Inhale 1-2 puffs into the lungs every 4 (four) hours as needed for wheezing or shortness of breath. 18 g 2   Blood Glucose  Monitoring Suppl (ACCU-CHEK GUIDE) w/Device KIT 1 each by Does not apply route 2 (two) times daily. 1 kit 0   Blood Pressure Monitor DEVI Please provide patient with insurance approved blood pressure monitor 1 each 0   busPIRone (BUSPAR) 15 MG tablet TAKE 1 TABLET(15 MG) BY MOUTH THREE TIMES DAILY 90 tablet 2   cetirizine (ZYRTEC) 10 MG tablet Take 1 tablet (10 mg total) by mouth daily. 90 tablet 3   diclofenac Sodium (VOLTAREN ARTHRITIS PAIN) 1 % GEL Apply 2 g topically 4 (four) times daily. 100 g 1   fluticasone (FLONASE) 50 MCG/ACT nasal spray Place 2 sprays into both nostrils daily. 16 g 6   glucose blood (ACCU-CHEK GUIDE) test strip Use as instructed. Inject into the skin twice daily E11.65 200 each 12   ibuprofen (ADVIL) 600 MG tablet Take 1 tablet (600 mg total) by mouth every 6 (six) hours. 40 tablet 1   ipratropium-albuterol (DUONEB) 0.5-2.5 (3) MG/3ML SOLN Take 3 mLs by nebulization every 4 (four) hours as needed. 360 mL 3  ketoconazole (NIZORAL) 2 % cream Apply 1 Application topically daily. 15 g 0   ketoconazole (NIZORAL) 2 % cream Apply 1 Application topically daily. 60 g 2   methocarbamol (ROBAXIN) 500 MG tablet Take 2 tablets (1,000 mg total) by mouth every 8 (eight) hours as needed. For muscle spasm 90 tablet 1   omeprazole (PRILOSEC) 40 MG capsule Take 40 mg by mouth daily.     ursodiol (ACTIGALL) 300 MG capsule Take 300 mg by mouth daily.     amLODipine (NORVASC) 10 MG tablet Take 0.5 tablets (5 mg total) by mouth daily. 45 tablet 1   glucose blood test strip Use as instructed. Check blood glucose level by fingerstick twice per day.  E11.65 100 each 12   pravastatin (PRAVACHOL) 20 MG tablet Take 1 tablet (20 mg total) by mouth daily. 90 tablet 1   Semaglutide, 1 MG/DOSE, (OZEMPIC, 1 MG/DOSE,) 4 MG/3ML SOPN INJECT 1MG  INTO THE SKIN ONCE A WEEK 9 mL 0   traZODone (DESYREL) 50 MG tablet TAKE 1/2 TO 1 TABLET BY MOUTH EVERY NIGHT AT BEDTIME AS NEEDED 90 tablet 0    mometasone-formoterol (DULERA) 100-5 MCG/ACT AERO Inhale 2 puffs into the lungs 2 (two) times daily. (Patient not taking: Reported on 05/01/2023) 13 g 6   No facility-administered medications prior to visit.    No Known Allergies     Objective:    BP 126/85 (BP Location: Left Arm, Patient Position: Sitting, Cuff Size: Large)   Pulse 60   Resp 20   Ht 5\' 11"  (1.803 m)   Wt 266 lb 9.6 oz (120.9 kg)   LMP 06/15/2020   SpO2 100%   BMI 37.18 kg/m  Wt Readings from Last 3 Encounters:  05/01/23 266 lb 9.6 oz (120.9 kg)  02/25/23 281 lb (127.5 kg)  01/29/23 292 lb (132.5 kg)    Physical Exam Vitals and nursing note reviewed.  Constitutional:      Appearance: She is well-developed.  HENT:     Head: Normocephalic and atraumatic.  Cardiovascular:     Rate and Rhythm: Normal rate and regular rhythm.     Heart sounds: Normal heart sounds. No murmur heard.    No friction rub. No gallop.  Pulmonary:     Effort: Pulmonary effort is normal. No tachypnea or respiratory distress.     Breath sounds: Normal breath sounds. No decreased breath sounds, wheezing, rhonchi or rales.  Chest:     Chest wall: No tenderness.  Abdominal:     General: Bowel sounds are normal.     Palpations: Abdomen is soft.  Musculoskeletal:        General: Normal range of motion.     Cervical back: Normal range of motion.  Skin:    General: Skin is warm and dry.  Neurological:     Mental Status: She is alert and oriented to person, place, and time.     Coordination: Coordination normal.  Psychiatric:        Behavior: Behavior normal. Behavior is cooperative.        Thought Content: Thought content normal.        Judgment: Judgment normal.          Patient has been counseled extensively about nutrition and exercise as well as the importance of adherence with medications and regular follow-up. The patient was given clear instructions to go to ER or return to medical center if symptoms don't improve, worsen  or new problems develop. The patient verbalized understanding.  Follow-up: Return for keep march appointment.   Claiborne Rigg, FNP-BC [facility] [place], [place] [phone]   05/01/2023, 3:59 PM

## 2023-05-06 ENCOUNTER — Other Ambulatory Visit: Payer: Self-pay | Admitting: Physician Assistant

## 2023-05-06 DIAGNOSIS — I10 Essential (primary) hypertension: Secondary | ICD-10-CM

## 2023-05-25 ENCOUNTER — Encounter: Payer: Medicaid Other | Admitting: Nurse Practitioner

## 2023-05-27 DIAGNOSIS — Z903 Acquired absence of stomach [part of]: Secondary | ICD-10-CM | POA: Diagnosis not present

## 2023-05-27 DIAGNOSIS — K912 Postsurgical malabsorption, not elsewhere classified: Secondary | ICD-10-CM | POA: Diagnosis not present

## 2023-06-03 ENCOUNTER — Encounter: Payer: Self-pay | Admitting: Nurse Practitioner

## 2023-06-03 ENCOUNTER — Ambulatory Visit: Payer: Medicaid Other | Attending: Nurse Practitioner | Admitting: Nurse Practitioner

## 2023-06-03 VITALS — BP 147/102 | HR 69 | Ht 71.0 in | Wt 260.6 lb

## 2023-06-03 DIAGNOSIS — Z0000 Encounter for general adult medical examination without abnormal findings: Secondary | ICD-10-CM

## 2023-06-03 DIAGNOSIS — L209 Atopic dermatitis, unspecified: Secondary | ICD-10-CM

## 2023-06-03 DIAGNOSIS — Z1211 Encounter for screening for malignant neoplasm of colon: Secondary | ICD-10-CM | POA: Diagnosis not present

## 2023-06-03 DIAGNOSIS — R6889 Other general symptoms and signs: Secondary | ICD-10-CM | POA: Diagnosis not present

## 2023-06-03 DIAGNOSIS — M25511 Pain in right shoulder: Secondary | ICD-10-CM | POA: Diagnosis not present

## 2023-06-03 LAB — POC COVID19/FLU A&B COMBO
Covid Antigen, POC: NEGATIVE
Influenza A Antigen, POC: NEGATIVE
Influenza B Antigen, POC: NEGATIVE

## 2023-06-03 MED ORDER — TRIAMCINOLONE ACETONIDE 0.025 % EX OINT
1.0000 | TOPICAL_OINTMENT | Freq: Two times a day (BID) | CUTANEOUS | 0 refills | Status: AC
Start: 2023-06-03 — End: ?

## 2023-06-03 MED ORDER — METHOCARBAMOL 500 MG PO TABS: 1000.0000 mg | ORAL_TABLET | Freq: Three times a day (TID) | ORAL | 1 refills | Status: AC | PRN

## 2023-06-03 NOTE — Patient Instructions (Signed)
 [facility] Located in: [facility] Address: [address], [place], [place] [address] Phone: [phone]

## 2023-06-03 NOTE — Progress Notes (Signed)
 Assessment & Plan:  Tonya Wilkerson was seen today for annual exam.  Diagnoses and all orders for this visit:  Encounter for annual physical exam -     Hemoglobin A1c -     CMP14+EGFR -     CBC with Differential -     Microalbumin / creatinine urine ratio  Flu-like symptoms -     POC Covid19/Flu A&B Antigen  Atopic dermatitis, unspecified type -     triamcinolone (KENALOG) 0.025 % ointment; Apply 1 Application topically 2 (two) times daily.  Acute pain of right shoulder -     methocarbamol (ROBAXIN) 500 MG tablet; Take 2 tablets (1,000 mg total) by mouth every 8 (eight) hours as needed. For muscle spasm  Colon cancer screening -     MM 3D SCREENING MAMMOGRAM BILATERAL BREAST; Future    Patient has been counseled on age-appropriate routine health concerns for screening and prevention. These are reviewed and up-to-date. Referrals have been placed accordingly. Immunizations are up-to-date or declined.    Subjective:   Chief Complaint  Patient presents with   Annual Exam    Tonya Wilkerson 53 y.o. female presents to office today for annual physical.   Patient has been counseled on age-appropriate routine health concerns for screening and prevention. These are reviewed and up-to-date. Referrals have been placed accordingly. Immunizations are up-to-date or declined.     COLON CANCER SCREENING: UTD PAP SMEAR: UTD MAMMOGRAM: UTD   Blood pressure is elevated today however she is also experiencing URI symptoms today. She states she is taking all of her medications as prescribed.  BP Readings from Last 3 Encounters:  06/03/23 (!) 147/102  05/01/23 126/85  02/25/23 (!) 165/98    Shoulder Pain: Patient complaints of right shoulder pain.  Pain is unrelated to any injury or trauma.  The pain is described as aching and sharp.  The onset of the pain was  several weeks ago .  The pain occurs when active and at night/ wakes from sleep at night.  Location is global. No history of dislocation.  Symptoms are aggravated by all activities. Symptoms are diminished by  rest.   Limited activities include: reaching, lifting, pulling, pushing, throwing, ADL's, repetitive use, work at or above shoulder height, difficulty sleeping on affected side.      Review of Systems  Constitutional:  Negative for fever, malaise/fatigue and weight loss.  HENT:  Positive for congestion and sore throat. Negative for nosebleeds.   Eyes: Negative.  Negative for blurred vision, double vision and photophobia.  Respiratory:  Positive for cough. Negative for shortness of breath.   Cardiovascular: Negative.  Negative for chest pain, palpitations and leg swelling.  Gastrointestinal: Negative.  Negative for heartburn, nausea and vomiting.  Genitourinary: Negative.   Musculoskeletal:  Positive for joint pain. Negative for myalgias.  Skin:  Positive for itching and rash.  Neurological:  Positive for headaches. Negative for dizziness, focal weakness and seizures.  Endo/Heme/Allergies: Negative.   Psychiatric/Behavioral: Negative.  Negative for suicidal ideas.     Past Medical History:  Diagnosis Date   Abnormal uterine bleeding (AUB)    Anemia    Anxiety    Asthma    COVID 03/2020   mild cold symptoms x 3 days all symptoms resolved   Diabetes mellitus without complication (HCC)    Dyspnea    with exertion   History of blood transfusion yrs ago   after childbirth   History of pre-eclampsia 17 yrs ago   Hypertension  Morbid obesity with BMI of 40.0-44.9, adult (HCC)    Pneumonia due to infectious organism 2020   Sleep apnea dx 2021   did not tolerate cpap mild osa    Wears glasses     Past Surgical History:  Procedure Laterality Date   BREAST BIOPSY Left 06/25/2022   MM LT BREAST BX W LOC DEV 1ST LESION IMAGE BX SPEC STEREO GUIDE 06/25/2022 [facility]   BREAST BIOPSY  09/04/2022   MM LT RADIOACTIVE SEED LOC MAMMO GUIDE 09/04/2022 [facility]   BREAST LUMPECTOMY WITH RADIOACTIVE SEED  LOCALIZATION Left 09/05/2022   Procedure: LEFT BREAST LUMPECTOMY WITH RADIOACTIVE SEED LOCALIZATION;  Surgeon: Griselda Miner, MD;  Location: [facility];  Service: General;  Laterality: Left;   CYSTOSCOPY N/A 07/17/2020   Procedure: CYSTOSCOPY;  Surgeon: [name], MD;  Location: [facility];  Service: Gynecology;  Laterality: N/A;   ROBOTIC ASSISTED LAPAROSCOPIC HYSTERECTOMY AND SALPINGECTOMY Bilateral 07/17/2020   Procedure: XI ROBOTIC ASSISTED TOTAL HYSTERECTOMY WITH BILATERAL SALPINGECTOMY;  Surgeon: [name], MD;  Location: [facility] [place];  Service: Gynecology;  Laterality: Bilateral;   TUBAL LIGATION  17 yrs ago    Family History  Problem Relation Age of Onset   Hypertension Mother    Stroke Mother    Diabetes Maternal Aunt    Cancer Maternal Grandfather     Social History Reviewed with no changes to be made today.   Outpatient Medications Prior to Visit  Medication Sig Dispense Refill   Accu-Chek Softclix Lancets lancets Use as instructed. Inject into the skin twice daily 100 each 3   albuterol (VENTOLIN HFA) 108 (90 Base) MCG/ACT inhaler Inhale 1-2 puffs into the lungs every 4 (four) hours as needed for wheezing or shortness of breath. 18 g 2   amLODIPine-Valsartan-HCTZ (EXFORGE HCT) 10-160-25 MG TABS Take 0.5 tablets by mouth daily. 45 tablet 1   Blood Glucose Monitoring Suppl (ACCU-CHEK GUIDE) w/Device KIT 1 each by Does not apply route 2 (two) times daily. 1 kit 0   Blood Pressure Monitor DEVI Please provide patient with insurance approved blood pressure monitor 1 each 0   busPIRone (BUSPAR) 15 MG tablet TAKE 1 TABLET(15 MG) BY MOUTH THREE TIMES DAILY 90 tablet 2   cetirizine (ZYRTEC) 10 MG tablet Take 1 tablet (10 mg total) by mouth daily. 90 tablet 3   diclofenac Sodium (VOLTAREN ARTHRITIS PAIN) 1 % GEL Apply 2 g topically 4 (four) times daily. 100 g 1   glucose blood (ACCU-CHEK GUIDE) test strip Use as  instructed. Inject into the skin twice daily E11.65 200 each 12   ibuprofen (ADVIL) 600 MG tablet Take 1 tablet (600 mg total) by mouth every 6 (six) hours. 40 tablet 1   ipratropium (ATROVENT) 0.03 % nasal spray Place 2 sprays into both nostrils every 12 (twelve) hours. 30 mL 12   ipratropium-albuterol (DUONEB) 0.5-2.5 (3) MG/3ML SOLN Take 3 mLs by nebulization every 4 (four) hours as needed. 360 mL 3   ketoconazole (NIZORAL) 2 % cream Apply 1 Application topically daily. 15 g 0   ketoconazole (NIZORAL) 2 % cream Apply 1 Application topically daily. 60 g 2   mometasone-formoterol (DULERA) 100-5 MCG/ACT AERO Inhale 2 puffs into the lungs 2 (two) times daily. 13 g 6   omeprazole (PRILOSEC) 40 MG capsule Take 40 mg by mouth daily.     pravastatin (PRAVACHOL) 20 MG tablet Take 1 tablet (20 mg total) by mouth daily. 90 tablet 1   Semaglutide,  1 MG/DOSE, 4 MG/3ML SOPN Inject 1 mg as directed once a week. 3 mL 6   traZODone (DESYREL) 50 MG tablet TAKE 1/2 TO 1 TABLET BY MOUTH EVERY NIGHT AT BEDTIME AS NEEDED 90 tablet 0   methocarbamol (ROBAXIN) 500 MG tablet Take 2 tablets (1,000 mg total) by mouth every 8 (eight) hours as needed. For muscle spasm 90 tablet 1   fluticasone (FLONASE) 50 MCG/ACT nasal spray Place 2 sprays into both nostrils daily. (Patient not taking: Reported on 06/03/2023) 16 g 6   No facility-administered medications prior to visit.    No Known Allergies     Objective:    BP (!) 147/102 (BP Location: Left Arm, Patient Position: Sitting, Cuff Size: Normal)   Pulse 69   Ht 5\' 11"  (1.803 m)   Wt 260 lb 9.6 oz (118.2 kg)   LMP 06/15/2020   SpO2 100%   BMI 36.35 kg/m  Wt Readings from Last 3 Encounters:  06/03/23 260 lb 9.6 oz (118.2 kg)  05/01/23 266 lb 9.6 oz (120.9 kg)  02/25/23 281 lb (127.5 kg)    Physical Exam Constitutional:      Appearance: She is well-developed.  HENT:     Head: Normocephalic and atraumatic.     Right Ear: Hearing, tympanic membrane, ear canal  and external ear normal.     Left Ear: Hearing, tympanic membrane, ear canal and external ear normal.     Nose: Nose normal.     Right Turbinates: Not enlarged.     Left Turbinates: Not enlarged.     Mouth/Throat:     Lips: Pink.     Mouth: Mucous membranes are moist.     Dentition: No dental tenderness, gingival swelling, dental abscesses or gum lesions.     Pharynx: No oropharyngeal exudate.  Eyes:     General: No scleral icterus.       Right eye: No discharge.     Extraocular Movements: Extraocular movements intact.     Conjunctiva/sclera: Conjunctivae normal.     Pupils: Pupils are equal, round, and reactive to light.  Neck:     Thyroid: No thyromegaly.     Trachea: No tracheal deviation.  Cardiovascular:     Rate and Rhythm: Normal rate and regular rhythm.     Heart sounds: Normal heart sounds. No murmur heard.    No friction rub.  Pulmonary:     Effort: Pulmonary effort is normal. No accessory muscle usage or respiratory distress.     Breath sounds: Normal breath sounds. No decreased breath sounds, wheezing, rhonchi or rales.  Abdominal:     General: Bowel sounds are normal. There is no distension.     Palpations: Abdomen is soft. There is no mass.     Tenderness: There is no abdominal tenderness. There is no right CVA tenderness, left CVA tenderness, guarding or rebound.     Hernia: No hernia is present.  Musculoskeletal:        General: No tenderness or deformity. Normal range of motion.     Cervical back: Normal range of motion and neck supple.  Lymphadenopathy:     Cervical: No cervical adenopathy.  Skin:    General: Skin is warm and dry.     Findings: No erythema.  Neurological:     Mental Status: She is alert and oriented to person, place, and time.     Cranial Nerves: No cranial nerve deficit.     Motor: Motor function is intact.  Coordination: Coordination is intact. Coordination normal.     Gait: Gait is intact.     Deep Tendon Reflexes:     Reflex  Scores:      Patellar reflexes are 1+ on the right side and 1+ on the left side. Psychiatric:        Attention and Perception: Attention normal.        Mood and Affect: Mood normal.        Speech: Speech normal.        Behavior: Behavior normal.        Thought Content: Thought content normal.        Judgment: Judgment normal.          Patient has been counseled extensively about nutrition and exercise as well as the importance of adherence with medications and regular follow-up. The patient was given clear instructions to go to ER or return to medical center if symptoms don't improve, worsen or new problems develop. The patient verbalized understanding.   Follow-up: Return in about 3 months (around 09/03/2023).   Claiborne Rigg, FNP-BC [facility] [place], [place] [phone]   06/03/2023, 3:28 PM

## 2023-06-04 LAB — CMP14+EGFR
ALT: 10 IU/L (ref 0–32)
AST: 17 IU/L (ref 0–40)
Albumin: 4.6 g/dL (ref 3.8–4.9)
Alkaline Phosphatase: 125 IU/L — ABNORMAL HIGH (ref 44–121)
BUN/Creatinine Ratio: 9 (ref 9–23)
BUN: 8 mg/dL (ref 6–24)
Bilirubin Total: 0.5 mg/dL (ref 0.0–1.2)
CO2: 25 mmol/L (ref 20–29)
Calcium: 10.1 mg/dL (ref 8.7–10.2)
Chloride: 103 mmol/L (ref 96–106)
Creatinine, Ser: 0.86 mg/dL (ref 0.57–1.00)
Globulin, Total: 2.5 g/dL (ref 1.5–4.5)
Glucose: 93 mg/dL (ref 70–99)
Potassium: 4.5 mmol/L (ref 3.5–5.2)
Sodium: 144 mmol/L (ref 134–144)
Total Protein: 7.1 g/dL (ref 6.0–8.5)
eGFR: 81 mL/min/{1.73_m2} (ref >59)

## 2023-06-04 LAB — CBC WITH DIFFERENTIAL/PLATELET
Basophils Absolute: 0.1 10*3/uL (ref 0.0–0.2)
Basos: 1 %
EOS (ABSOLUTE): 0.3 10*3/uL (ref 0.0–0.4)
Eos: 4 %
Hematocrit: 45.2 % (ref 34.0–46.6)
Hemoglobin: 14.6 g/dL (ref 11.1–15.9)
Immature Grans (Abs): 0 10*3/uL (ref 0.0–0.1)
Immature Granulocytes: 0 %
Lymphocytes Absolute: 2.3 10*3/uL (ref 0.7–3.1)
Lymphs: 29 %
MCH: 26.4 pg — ABNORMAL LOW (ref 26.6–33.0)
MCHC: 32.3 g/dL (ref 31.5–35.7)
MCV: 82 fL (ref 79–97)
Monocytes Absolute: 0.7 10*3/uL (ref 0.1–0.9)
Monocytes: 8 %
Neutrophils Absolute: 4.6 10*3/uL (ref 1.4–7.0)
Neutrophils: 58 %
Platelets: 398 10*3/uL (ref 150–450)
RBC: 5.52 x10E6/uL — ABNORMAL HIGH (ref 3.77–5.28)
RDW: 14.5 % (ref 11.7–15.4)
WBC: 8 10*3/uL (ref 3.4–10.8)

## 2023-06-04 LAB — HEMOGLOBIN A1C
Est. average glucose Bld gHb Est-mCnc: 128 mg/dL
Hgb A1c MFr Bld: 6.1 % — ABNORMAL HIGH (ref 4.8–5.6)

## 2023-06-05 LAB — MICROALBUMIN / CREATININE URINE RATIO
Creatinine, Urine: 262.7 mg/dL
Microalb/Creat Ratio: 5 mg/g{creat} (ref 0–29)
Microalbumin, Urine: 14.2 ug/mL

## 2023-06-07 ENCOUNTER — Encounter: Payer: Self-pay | Admitting: Nurse Practitioner

## 2023-06-09 DIAGNOSIS — Z903 Acquired absence of stomach [part of]: Secondary | ICD-10-CM | POA: Diagnosis not present

## 2023-06-09 DIAGNOSIS — I10 Essential (primary) hypertension: Secondary | ICD-10-CM | POA: Diagnosis not present

## 2023-06-12 ENCOUNTER — Ambulatory Visit
Admission: RE | Admit: 2023-06-12 | Discharge: 2023-06-12 | Disposition: A | Discharge status: Home / Self Care | Source: Ambulatory Visit | Attending: Nurse Practitioner | Admitting: Nurse Practitioner

## 2023-06-12 DIAGNOSIS — Z1231 Encounter for screening mammogram for malignant neoplasm of breast: Secondary | ICD-10-CM | POA: Diagnosis not present

## 2023-06-12 DIAGNOSIS — Z1211 Encounter for screening for malignant neoplasm of colon: Secondary | ICD-10-CM

## 2023-06-17 ENCOUNTER — Encounter: Payer: Self-pay | Admitting: Nurse Practitioner

## 2023-06-17 DIAGNOSIS — J3089 Other allergic rhinitis: Secondary | ICD-10-CM | POA: Diagnosis not present

## 2023-06-17 DIAGNOSIS — G4733 Obstructive sleep apnea (adult) (pediatric): Secondary | ICD-10-CM | POA: Diagnosis not present

## 2023-06-17 DIAGNOSIS — Z6836 Body mass index (BMI) 36.0-36.9, adult: Secondary | ICD-10-CM | POA: Diagnosis not present

## 2023-06-17 DIAGNOSIS — E66812 Obesity, class 2: Secondary | ICD-10-CM | POA: Diagnosis not present

## 2023-06-21 ENCOUNTER — Encounter: Payer: Self-pay | Admitting: Nurse Practitioner

## 2023-06-29 DIAGNOSIS — G4733 Obstructive sleep apnea (adult) (pediatric): Secondary | ICD-10-CM | POA: Diagnosis not present

## 2023-07-01 DIAGNOSIS — Z6836 Body mass index (BMI) 36.0-36.9, adult: Secondary | ICD-10-CM | POA: Diagnosis not present

## 2023-07-01 DIAGNOSIS — E66812 Obesity, class 2: Secondary | ICD-10-CM | POA: Diagnosis not present

## 2023-07-01 DIAGNOSIS — G4733 Obstructive sleep apnea (adult) (pediatric): Secondary | ICD-10-CM | POA: Diagnosis not present

## 2023-07-01 DIAGNOSIS — J3089 Other allergic rhinitis: Secondary | ICD-10-CM | POA: Diagnosis not present

## 2023-07-29 IMAGING — MG MM DIGITAL SCREENING BILAT W/ TOMO AND CAD
8 of 14 series · 8 of 40 positions shown · non-contrast
Comparison: Previous exam(s).

CLINICAL DATA: Screening.

EXAM:
DIGITAL SCREENING BILATERAL MAMMOGRAM WITH TOMOSYNTHESIS AND CAD
TECHNIQUE: Bilateral screening digital craniocaudal and mediolateral oblique
mammograms were obtained. Bilateral screening digital breast
tomosynthesis was performed. The images were evaluated with
computer-aided detection.

[L CC synth-2D (1 of 2)]
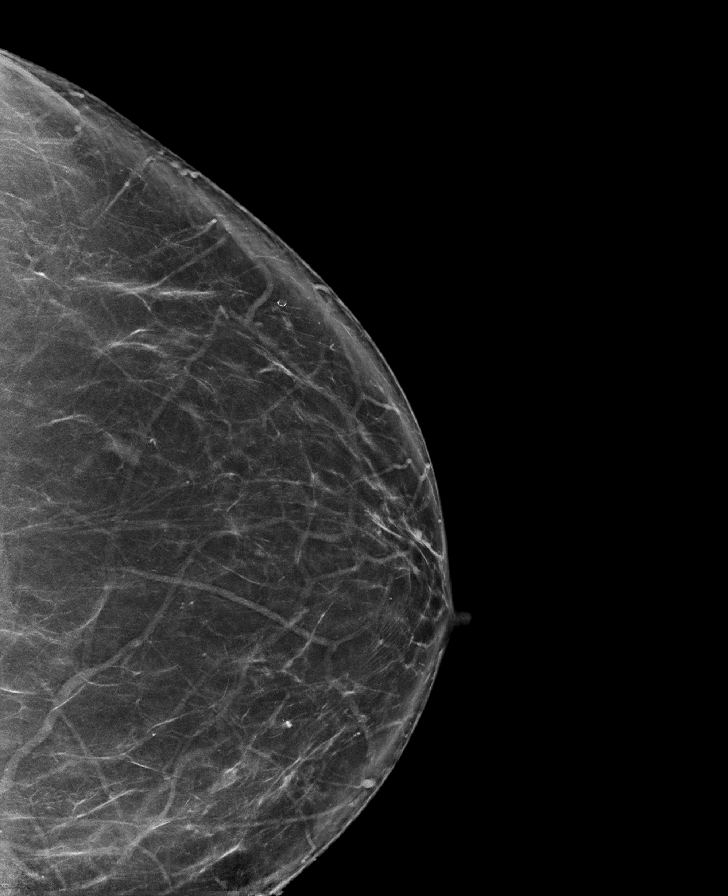

[L CC synth-2D (2 of 2)]
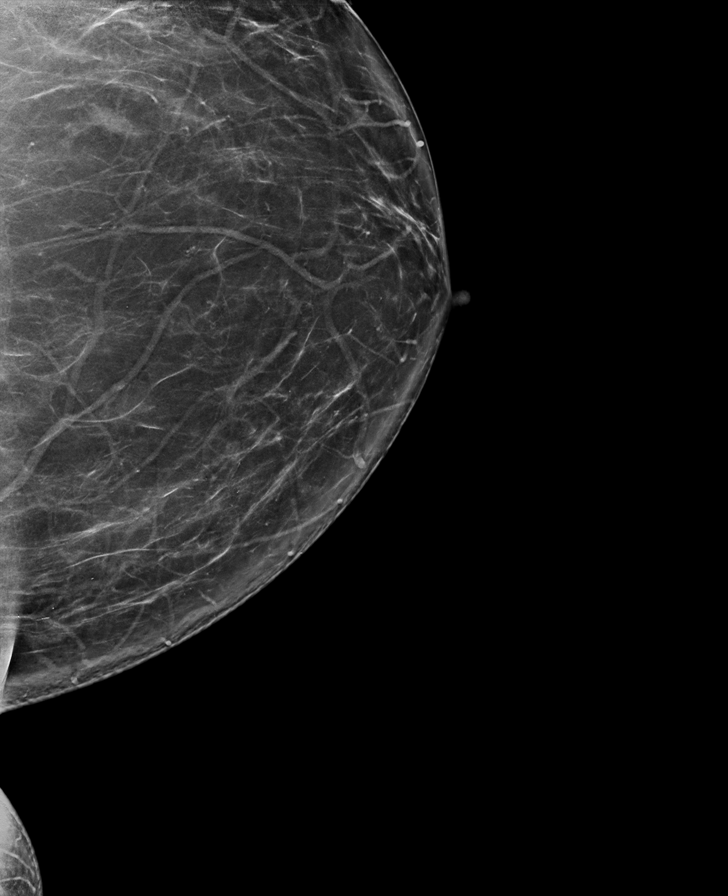

[R MLO synth-2D (1 of 2)]
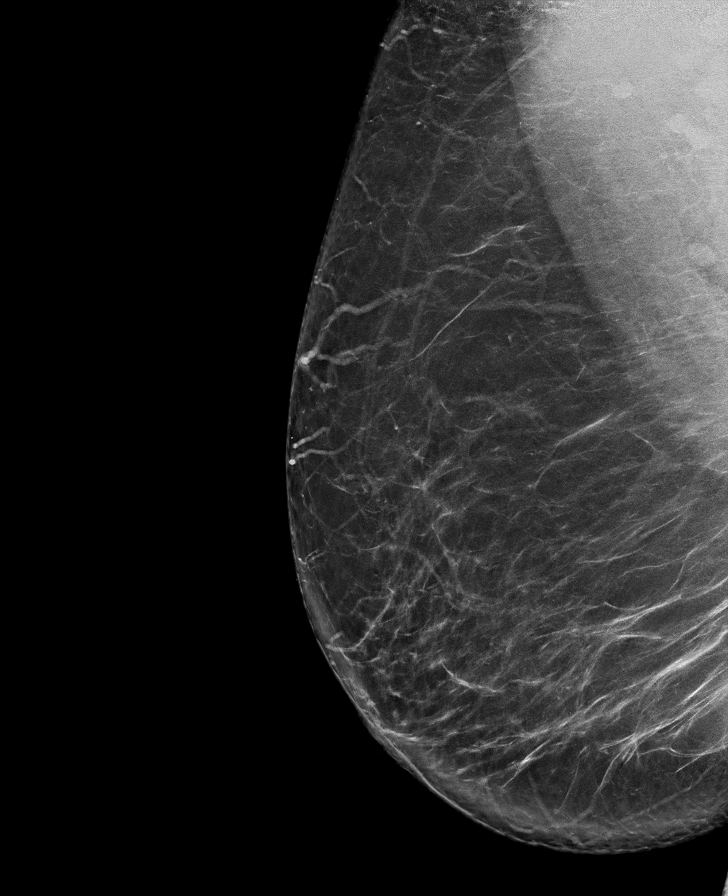

[R CC synth-2D (1 of 2)]
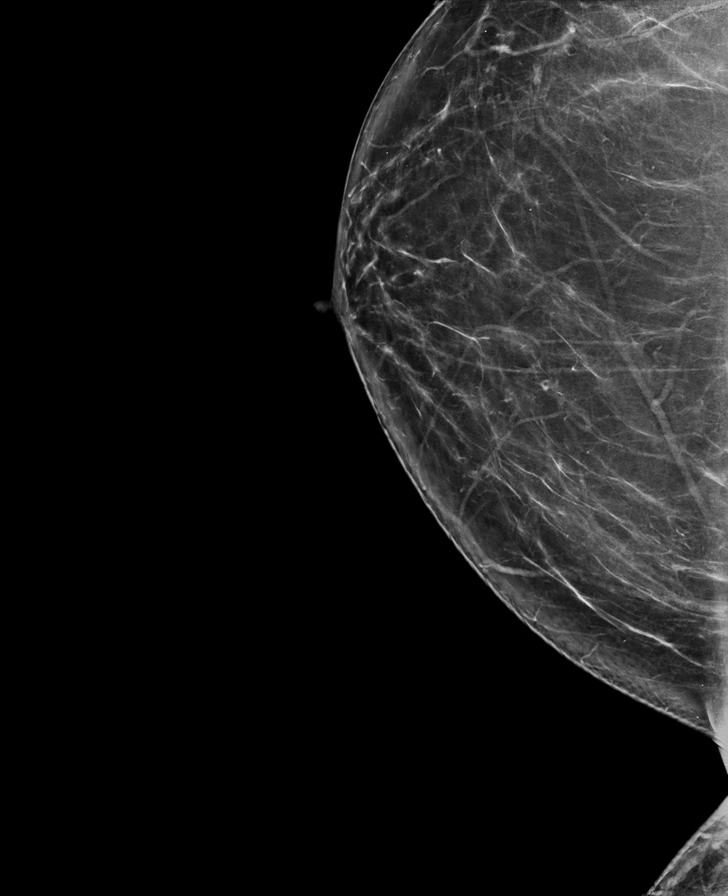

[R CC synth-2D (2 of 2)]
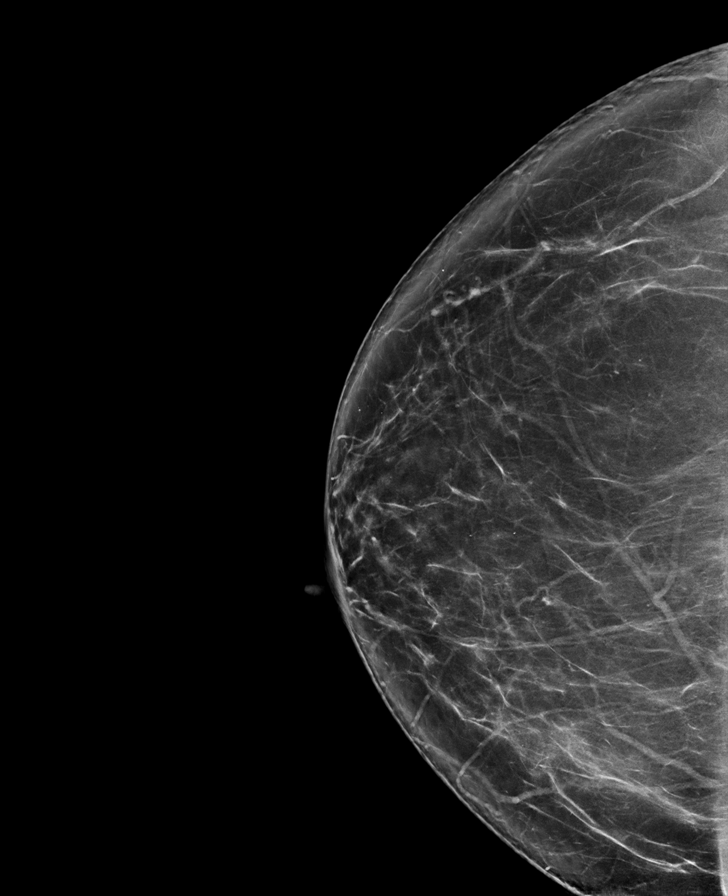

[L MLO synth-2D]
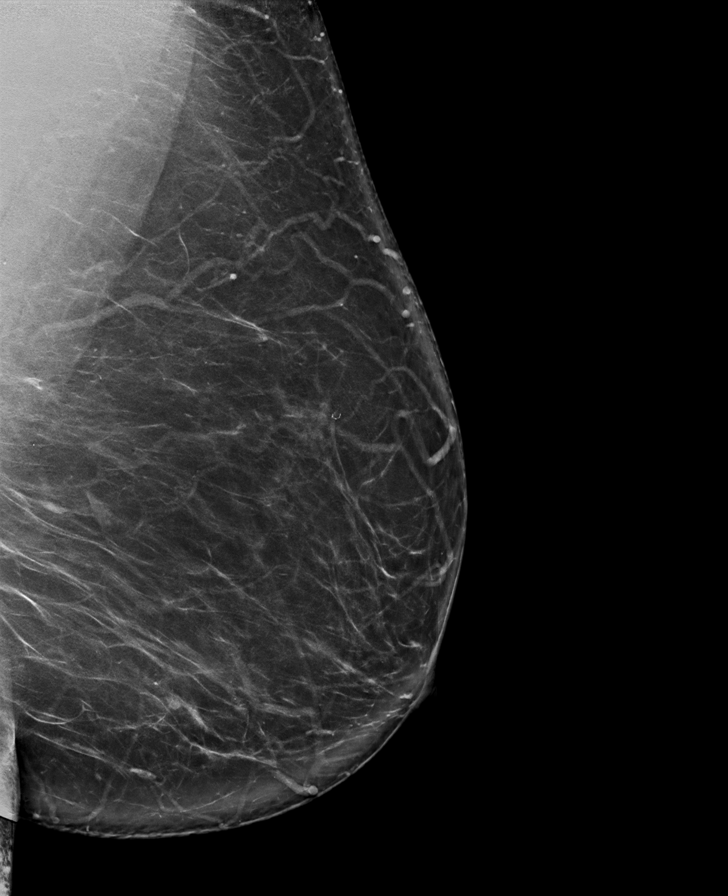

[R MLO synth-2D (2 of 2)]
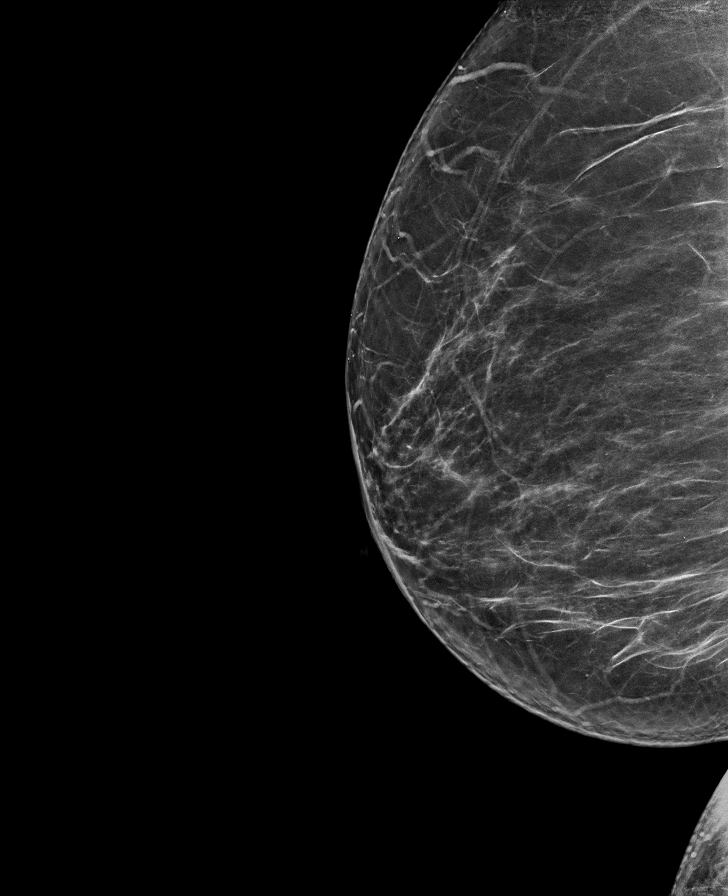

[L CC tomo · tomo slice 46/91.0]
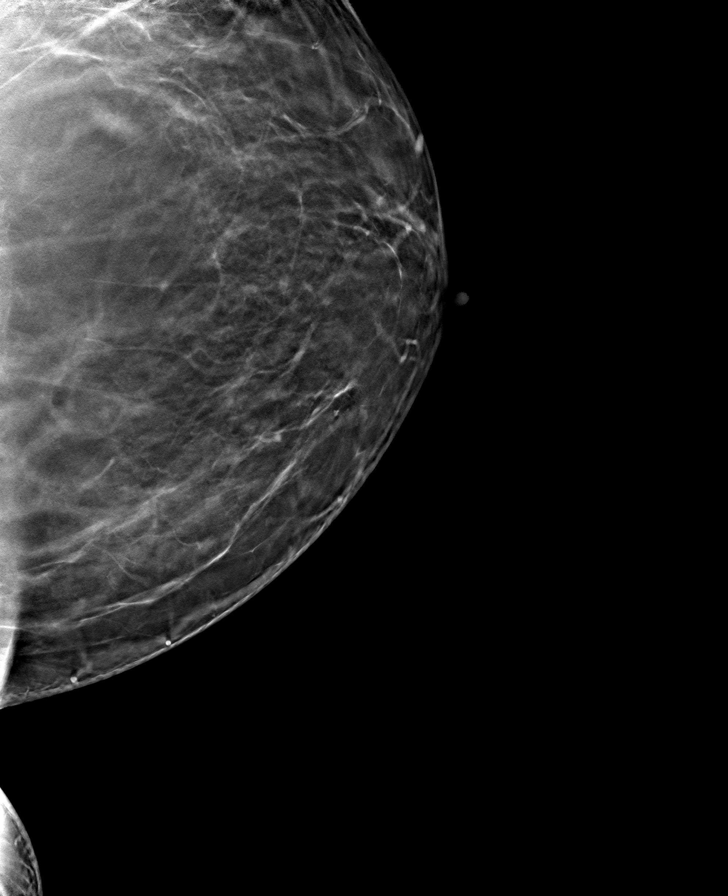

[8 of 40 positions shown; findings below may reference images not displayed]

ACR Breast Density Category b: There are scattered areas of
fibroglandular density.
FINDINGS: There are no findings suspicious for malignancy.
IMPRESSION: No mammographic evidence of malignancy. A result letter of this
screening mammogram will be mailed directly to the patient.

RECOMMENDATION:
Screening mammogram in one year. (Code:51-O-LD2)

BI-RADS CATEGORY  1: Negative.

## 2023-08-27 ENCOUNTER — Ambulatory Visit: Payer: Medicaid Other | Admitting: Podiatry

## 2023-08-27 DIAGNOSIS — Z91199 Patient's noncompliance with other medical treatment and regimen due to unspecified reason: Secondary | ICD-10-CM

## 2023-08-27 NOTE — Progress Notes (Signed)
 No show

## 2023-09-04 ENCOUNTER — Ambulatory Visit: Admitting: Nurse Practitioner

## 2023-11-09 ENCOUNTER — Other Ambulatory Visit: Payer: Self-pay | Admitting: Nurse Practitioner

## 2023-11-09 DIAGNOSIS — E785 Hyperlipidemia, unspecified: Secondary | ICD-10-CM

## 2023-11-10 NOTE — Telephone Encounter (Signed)
 Requested medications are due for refill today.  yes  Requested medications are on the active medications list.  yes  Last refill. 05/01/2023 #90 1 rf  Future visit scheduled.   no  Notes to clinic.  Labs are expired.    Requested Prescriptions  Pending Prescriptions Disp Refills   pravastatin  (PRAVACHOL ) 20 MG tablet [Pharmacy Med Name: PRAVASTATIN  20MG  TABLETS] 90 tablet 1    Sig: TAKE 1 TABLET(20 MG) BY MOUTH DAILY     Cardiovascular:  Antilipid - Statins Failed - 11/10/2023  3:26 PM      Failed - Lipid Panel in normal range within the last 12 months    Cholesterol, Total  Date Value Ref Range Status  05/16/2022 237 (H) 100 - 199 mg/dL Final   LDL Chol Calc (NIH)  Date Value Ref Range Status  05/16/2022 97 0 - 99 mg/dL Final   HDL  Date Value Ref Range Status  05/16/2022 125 >39 mg/dL Final   Triglycerides  Date Value Ref Range Status  05/16/2022 88 0 - 149 mg/dL Final         Passed - Patient is not pregnant      Passed - Valid encounter within last 12 months    Recent Outpatient Visits           5 months ago Encounter for annual physical exam   [facility] [place]. [facility] Theotis Haze ORN, NP   6 months ago Essential hypertension   [facility] [place]. [facility] [place], [place] W, NP   9 months ago Type 2 diabetes mellitus with hyperglycemia, without long-term current use of insulin (HCC)   [facility] [place]. [facility], Jon HERO, [place]   1 year ago Primary hypertension   [facility] [place]. [facility] Theotis Haze ORN, NP   1 year ago Primary hypertension   [facility] Of [place]. [facility] Theotis Haze ORN, [place]

## 2023-11-24 ENCOUNTER — Other Ambulatory Visit: Payer: Self-pay | Admitting: Nurse Practitioner

## 2023-11-24 DIAGNOSIS — I10 Essential (primary) hypertension: Secondary | ICD-10-CM

## 2023-11-26 NOTE — Telephone Encounter (Signed)
 Requested Prescriptions  Pending Prescriptions Disp Refills   amLODIPine -Valsartan -HCTZ 10-160-25 MG TABS [Pharmacy Med Name: AMLOD/VAL/HCTZ 10/160/25MG  TABLETS] 45 tablet 0    Sig: TAKE 1/2 TABLET BY MOUTH DAILY     Cardiovascular: CCB + ARB + Diuretic Combos Failed - 11/26/2023  8:15 AM      Failed - Last BP in normal range    BP Readings from Last 1 Encounters:  06/03/23 (!) 147/102         Passed - K in normal range and within 180 days    Potassium  Date Value Ref Range Status  06/03/2023 4.5 3.5 - 5.2 mmol/L Final         Passed - Na in normal range and within 180 days    Sodium  Date Value Ref Range Status  06/03/2023 144 134 - 144 mmol/L Final         Passed - Cr in normal range and within 180 days    Creatinine, Ser  Date Value Ref Range Status  06/03/2023 0.86 0.57 - 1.00 mg/dL Final         Passed - eGFR is 10 or above and within 180 days    GFR calc Af Amer  Date Value Ref Range Status  12/28/2019 90 >59 mL/min/1.73 Final    Comment:    **In accordance with recommendations from the NKF-ASN Task force,**   [facility] is in the process of updating its eGFR calculation to the   2021 CKD-EPI creatinine equation that estimates kidney function   without a race variable.    GFR, Estimated  Date Value Ref Range Status  09/03/2022 >60 >60 mL/min Final    Comment:    (NOTE) Calculated using the CKD-EPI Creatinine Equation (2021)    eGFR  Date Value Ref Range Status  06/03/2023 81 >59 mL/min/1.73 Final         Passed - Patient is not pregnant      Passed - Last Heart Rate in normal range    Pulse Readings from Last 1 Encounters:  06/03/23 69         Passed - Valid encounter within last 6 months    Recent Outpatient Visits           5 months ago Encounter for annual physical exam   [facility] - [facility] Dept Of [place]. [facility] Theotis Haze ORN, NP   6 months ago Essential hypertension   [facility] - A  Dept Of [place]. [facility] [place], [place] W, NP   10 months ago Type 2 diabetes mellitus with hyperglycemia, without long-term current use of insulin (HCC)   [facility] - A Dept Of [place]. [facility] [place], Jon HERO, [place]   1 year ago Primary hypertension   [facility] - A Dept Of [place]. [facility] Theotis Haze ORN, NP   1 year ago Primary hypertension   [facility] - A Dept Of [place]. [facility] Theotis Haze ORN, [place]
